# Patient Record
Sex: Male | Born: 1935 | Race: White | Hispanic: No | Marital: Married | Smoking: Former smoker
Health system: Southern US, Community
[De-identification: ages and names within clinical notes are randomized; demographics above are authoritative.]

## PROBLEM LIST (undated history)

## (undated) DIAGNOSIS — E785 Hyperlipidemia, unspecified: Secondary | ICD-10-CM

## (undated) DIAGNOSIS — I10 Essential (primary) hypertension: Secondary | ICD-10-CM

## (undated) DIAGNOSIS — J309 Allergic rhinitis, unspecified: Secondary | ICD-10-CM

## (undated) DIAGNOSIS — M1990 Unspecified osteoarthritis, unspecified site: Secondary | ICD-10-CM

## (undated) DIAGNOSIS — K219 Gastro-esophageal reflux disease without esophagitis: Secondary | ICD-10-CM

## (undated) DIAGNOSIS — J449 Chronic obstructive pulmonary disease, unspecified: Secondary | ICD-10-CM

## (undated) DIAGNOSIS — E1151 Type 2 diabetes mellitus with diabetic peripheral angiopathy without gangrene: Secondary | ICD-10-CM

## (undated) DIAGNOSIS — N289 Disorder of kidney and ureter, unspecified: Secondary | ICD-10-CM

## (undated) DIAGNOSIS — I2510 Atherosclerotic heart disease of native coronary artery without angina pectoris: Secondary | ICD-10-CM

## (undated) HISTORY — DX: Hyperlipidemia, unspecified: E78.5

## (undated) HISTORY — DX: Atherosclerotic heart disease of native coronary artery without angina pectoris: I25.10

## (undated) HISTORY — DX: Disorder of kidney and ureter, unspecified: N28.9

## (undated) HISTORY — DX: Unspecified osteoarthritis, unspecified site: M19.90

## (undated) HISTORY — DX: Type 2 diabetes mellitus with diabetic peripheral angiopathy without gangrene: E11.51

## (undated) HISTORY — DX: Gastro-esophageal reflux disease without esophagitis: K21.9

## (undated) HISTORY — DX: Essential (primary) hypertension: I10

## (undated) HISTORY — DX: Chronic obstructive pulmonary disease, unspecified: J44.9

## (undated) HISTORY — DX: Allergic rhinitis, unspecified: J30.9

---

## 2003-06-22 ENCOUNTER — Inpatient Hospital Stay (HOSPITAL_COMMUNITY): Admission: EM | Admit: 2003-06-22 | Discharge: 2003-06-24 | Payer: Self-pay | Admitting: Internal Medicine

## 2009-09-12 ENCOUNTER — Inpatient Hospital Stay (HOSPITAL_COMMUNITY): Admission: RE | Admit: 2009-09-12 | Discharge: 2009-09-18 | Payer: Self-pay | Admitting: Orthopedic Surgery

## 2009-09-17 ENCOUNTER — Encounter (INDEPENDENT_AMBULATORY_CARE_PROVIDER_SITE_OTHER): Payer: Self-pay | Admitting: Orthopedic Surgery

## 2009-09-17 ENCOUNTER — Ambulatory Visit: Payer: Self-pay | Admitting: Vascular Surgery

## 2011-03-06 LAB — CBC
HCT: 30.6 % — ABNORMAL LOW (ref 39.0–52.0)
HCT: 36 % — ABNORMAL LOW (ref 39.0–52.0)
HCT: 43.6 % (ref 39.0–52.0)
Hemoglobin: 9.9 g/dL — ABNORMAL LOW (ref 13.0–17.0)
Hemoglobin: 10.4 g/dL — ABNORMAL LOW (ref 13.0–17.0)
Hemoglobin: 12.4 g/dL — ABNORMAL LOW (ref 13.0–17.0)
Hemoglobin: 14.9 g/dL (ref 13.0–17.0)
MCHC: 34 g/dL (ref 30.0–36.0)
MCV: 96 fL (ref 78.0–100.0)
Platelets: 111 10*3/uL — ABNORMAL LOW (ref 150–400)
Platelets: 145 10*3/uL — ABNORMAL LOW (ref 150–400)
Platelets: 154 10*3/uL (ref 150–400)
RBC: 4.57 MIL/uL (ref 4.22–5.81)
RDW: 13.3 % (ref 11.5–15.5)
WBC: 4 10*3/uL (ref 4.0–10.5)
WBC: 7.5 10*3/uL (ref 4.0–10.5)

## 2011-03-06 LAB — URINALYSIS, ROUTINE W REFLEX MICROSCOPIC
Bilirubin Urine: NEGATIVE
Hgb urine dipstick: NEGATIVE
Specific Gravity, Urine: 1.017 (ref 1.005–1.030)
Urobilinogen, UA: 0.2 mg/dL (ref 0.0–1.0)
pH: 7 (ref 5.0–8.0)

## 2011-03-06 LAB — BASIC METABOLIC PANEL
BUN: 31 mg/dL — ABNORMAL HIGH (ref 6–23)
BUN: 32 mg/dL — ABNORMAL HIGH (ref 6–23)
BUN: 29 mg/dL — ABNORMAL HIGH (ref 6–23)
BUN: 32 mg/dL — ABNORMAL HIGH (ref 6–23)
CO2: 27 mEq/L (ref 19–32)
CO2: 25 mEq/L (ref 19–32)
Calcium: 8.2 mg/dL — ABNORMAL LOW (ref 8.4–10.5)
Calcium: 8.7 mg/dL (ref 8.4–10.5)
Calcium: 7.7 mg/dL — ABNORMAL LOW (ref 8.4–10.5)
Chloride: 102 mEq/L (ref 96–112)
Chloride: 103 mEq/L (ref 96–112)
Chloride: 104 mEq/L (ref 96–112)
Chloride: 104 mEq/L (ref 96–112)
Creatinine, Ser: 1.46 mg/dL (ref 0.4–1.5)
Creatinine, Ser: 1.74 mg/dL — ABNORMAL HIGH (ref 0.4–1.5)
Creatinine, Ser: 2.56 mg/dL — ABNORMAL HIGH (ref 0.4–1.5)
GFR calc Af Amer: 46 mL/min — ABNORMAL LOW (ref >60)
GFR calc Af Amer: 57 mL/min — ABNORMAL LOW (ref >60)
GFR calc Af Amer: 41 mL/min — ABNORMAL LOW (ref >60)
GFR calc non Af Amer: 38 mL/min — ABNORMAL LOW (ref >60)
GFR calc non Af Amer: 47 mL/min — ABNORMAL LOW (ref >60)
GFR calc non Af Amer: 28 mL/min — ABNORMAL LOW (ref >60)
GFR calc non Af Amer: 34 mL/min — ABNORMAL LOW (ref >60)
Glucose, Bld: 143 mg/dL — ABNORMAL HIGH (ref 70–99)
Potassium: 4.5 mEq/L (ref 3.5–5.1)
Potassium: 4.6 mEq/L (ref 3.5–5.1)
Potassium: 4.5 mEq/L (ref 3.5–5.1)
Potassium: 5.6 mEq/L — ABNORMAL HIGH (ref 3.5–5.1)
Sodium: 135 mEq/L (ref 135–145)
Sodium: 137 mEq/L (ref 135–145)
Sodium: 133 mEq/L — ABNORMAL LOW (ref 135–145)
Sodium: 137 mEq/L (ref 135–145)
Sodium: 137 mEq/L (ref 135–145)

## 2011-03-06 LAB — URINE MICROSCOPIC-ADD ON

## 2011-03-06 LAB — GLUCOSE, CAPILLARY
Glucose-Capillary: 119 mg/dL — ABNORMAL HIGH (ref 70–99)
Glucose-Capillary: 119 mg/dL — ABNORMAL HIGH (ref 70–99)
Glucose-Capillary: 128 mg/dL — ABNORMAL HIGH (ref 70–99)
Glucose-Capillary: 129 mg/dL — ABNORMAL HIGH (ref 70–99)
Glucose-Capillary: 134 mg/dL — ABNORMAL HIGH (ref 70–99)
Glucose-Capillary: 144 mg/dL — ABNORMAL HIGH (ref 70–99)
Glucose-Capillary: 153 mg/dL — ABNORMAL HIGH (ref 70–99)
Glucose-Capillary: 129 mg/dL — ABNORMAL HIGH (ref 70–99)
Glucose-Capillary: 131 mg/dL — ABNORMAL HIGH (ref 70–99)
Glucose-Capillary: 136 mg/dL — ABNORMAL HIGH (ref 70–99)
Glucose-Capillary: 147 mg/dL — ABNORMAL HIGH (ref 70–99)

## 2011-03-06 LAB — COMPREHENSIVE METABOLIC PANEL
Albumin: 4.1 g/dL (ref 3.5–5.2)
Alkaline Phosphatase: 39 U/L (ref 39–117)
BUN: 25 mg/dL — ABNORMAL HIGH (ref 6–23)
CO2: 30 mEq/L (ref 19–32)
Chloride: 105 mEq/L (ref 96–112)
GFR calc non Af Amer: 38 mL/min — ABNORMAL LOW (ref >60)
Potassium: 4.8 mEq/L (ref 3.5–5.1)
Total Bilirubin: 0.8 mg/dL (ref 0.3–1.2)

## 2011-03-06 LAB — PROTIME-INR
INR: 1.59 — ABNORMAL HIGH (ref 0.00–1.49)
INR: 1.83 — ABNORMAL HIGH (ref 0.00–1.49)
INR: 2.16 — ABNORMAL HIGH (ref 0.00–1.49)
INR: 1 (ref 0.00–1.49)
INR: 1.3 (ref 0.00–1.49)
Prothrombin Time: 18.8 seconds — ABNORMAL HIGH (ref 11.6–15.2)
Prothrombin Time: 21 seconds — ABNORMAL HIGH (ref 11.6–15.2)

## 2011-03-06 LAB — CARDIAC PANEL(CRET KIN+CKTOT+MB+TROPI)
CK, MB: 1.4 ng/mL (ref 0.3–4.0)
CK, MB: 1.6 ng/mL (ref 0.3–4.0)
Relative Index: 0.3 (ref 0.0–2.5)
Relative Index: 0.3 (ref 0.0–2.5)
Total CK: 517 U/L — ABNORMAL HIGH (ref 7–232)
Troponin I: 0.04 ng/mL (ref 0.00–0.06)

## 2011-03-06 LAB — TYPE AND SCREEN
ABO/RH(D): A POS
Antibody Screen: NEGATIVE

## 2011-04-18 NOTE — H&P (Signed)
NAME:  Brad [name], ARNWINE NO.:  [id number]   MEDICAL RECORD NO.:  [id number]                   PATIENT TYPE:  INP   LOCATION:  NA                                   FACILITY:  [facility]   PHYSICIAN:  Fay Records, M.D.                 DATE OF BIRTH:  1931-10-04   DATE OF ADMISSION:  DATE OF DISCHARGE:                                HISTORY & PHYSICAL   HISTORY OF PRESENT ILLNESS:  The patient is a 77 year old gentleman who is  transferred from [facility] for evaluation of chest pain.   The patient has a known history of coronary artery disease.  He is status  post myocardial infarction back in 1988.  He underwent CABG at that time by  Dr. Arlyce Dice.  Operative report not available.  He was doing well with no  chest pain.  Stress Cardiolite last Fall was reported normal.  He had a  recent upper respiratory infection with questioned pneumonia, treated with 8  of 10 days of doxycycline.  This is improving with this.  He had left-sided  pleuritic pain.   This morning, he awoke at about 3 a.m. with right-sided pain, non-pleuritic,  non-positional.  He went to rehabilitation.  He walked on the treadmill.  He  pushed some weights.  He went back to the treadmill.  The pain got worse.  It spread more from right to mid chest.  He saw Dr. Berdine Addison there, and he was  taken to the ER.   EKG showed no acute changes.  He was given nitroglycerin, Integrilin and  heparin and transferred.  Note three CK's were negative.  Troponin level was  0.1.  Repeat was 3.1.   Currently the patient is pain-free.   ALLERGIES:  Penicillin.   CURRENT MEDICATIONS:  1. Aspirin 81 mg daily.  2. Toprol XL 50 daily.  3. Protonix 40 daily.  4. Lipitor 10 daily.  5. Tricor 160 daily.  6. Centrum daily.   PAST MEDICAL HISTORY:  1. Coronary artery disease.  2. History of upper respiratory infection/ bronchitis.  3. Hypertension.  4. Gastroesophageal reflux disease.  5.  Hyperlipidemia.   PAST SURGICAL HISTORY:  Status post coronary artery bypass graft.   SOCIAL HISTORY:  The patient quit tobacco in 1988 after a 25-pack year.  Occasionally drinks.  He is married.   FAMILY HISTORY:  Father died of a myocardial infarction at age 58.   PHYSICAL EXAMINATION:  GENERAL:  The patient is in no acute distress.  VITAL SIGNS:  Blood pressure 136/67.  Pulse 59.  Temperature is 98.3.  NECK:  Unable to assess JVP.  LUNGS:  Clear.  CARDIAC:  Regular rate and rhythm, S1 and S2.  No S3 or S4.  No murmurs.  CHEST:  Nontender.  ABDOMEN:  Supple.  EXTREMITIES:  There are 2+ pulses, no edema.   LABORATORY DATA:  Significant for hemoglobin of 14.5, platelets 180,000.  WBC 5.2.  BUN and creatinine 19 and 1.  CK 120, 147, 165.  Troponin 0.1 and  3.1.   Abdominal ultrasound showed fatty liver changes.  Chest CT, no PE, mild  interstitial prominence noted.   A 12-lead EKG here showed normal sinus bradycardia at a rate of 55 beats per  minute.  Incomplete right bundle-branch block.  LVH.  Nonspecific  ST-T wave  changes.   IMPRESSION:  1. The patient is a 77 year old gentleman who is now 16 years post coronary     artery bypass graft with chest pain.  The pain is very atypical.  He did     have a troponin done, but interestingly, it is unclear when the labs were     drawn.  His CK's have remained negative, but his troponin increased to     3.1.  We are having a repeat done here.  (It appears that two sets may     have been drawn near the same time).  Currently, the patient is pain-     free.  He is on Integrilin, heparin, nitroglycerin, and we will continue     this.  2. We will plan cardiac catheterization for tomorrow.                                               Fay Records, M.D.    PVR/MEDQ  D:  06/22/2003  T:  06/23/2003  Job:  [id number]

## 2011-04-18 NOTE — Discharge Summary (Signed)
NAME:  Brad [name], Brad [name]                          ACCOUNT NO.:  [id number]   MEDICAL RECORD NO.:  [id number]                   PATIENT TYPE:  INP   LOCATION:  2005                                 FACILITY:  [facility]   PHYSICIAN:  Dorris Carnes, M.D.                    DATE OF BIRTH:  September 19, 1931   DATE OF ADMISSION:  06/22/2003  DATE OF DISCHARGE:  06/24/2003                                 DISCHARGE SUMMARY   HISTORY OF PRESENT ILLNESS:  This is a 77 year old male with a history of a  MI in 1988. He underwent coronary artery bypass grafting surgery later that  year performed by Dr. Arlyce Dice. He has been doing well until recently when he  developed chest pain. He was admitted to [facility] for further  evaluation.   PAST MEDICAL HISTORY:  As noted, the patient has a history of coronary  artery disease with coronary artery bypass grafting surgery in 1988 as well  as an MI that year. He recently had an upper respiratory tract infection and  bronchitis. He has a history of hypertension, gastroesophageal reflux  disease with a previous negative upper endoscopy. He has a history of  elevated cholesterol levels.   ALLERGIES:  PENICILLIN.   SOCIAL HISTORY:  The patient quit smoking in 1988. He uses alcohol  occasionally. He is married.   HOSPITAL COURSE:  As noted, this patient was admitted to [facility]  on June 22, 2003 to evaluate for chest pain with known coronary artery  disease. The patient underwent cardiac catheterization on June 23, 2003  performed by Dr. Albertine Patricia. The patient was noted to have diffuse small vessel  disease. However, his grafts were widely patent. Continued medical therapy  was recommended with the addition of Plavix. Dr. Albertine Patricia also recommended the  initiation of Altace. However, at the time of discharge the patient reported  that he had previously been tried on Altace and did not tolerate this  medication. We decided to hold the Altace and to let his  primary  cardiologist, Dr. Pati Gallo decide on further medical therapy. It should be  noted that the patient had cardiac enzymes consistent with a non-Q wave MI  this admission. Arrangements were made to discharge the patient on June 24, 2003 in improved and stable condition.   LABORATORY DATA:  CBC revealed hemoglobin 14.3, hematocrit 40.9, WBC 6.7  thousand. Platelets 150,000. BUN was 11, creatinine 1.1, potassium 4.2,  glucose 145, PTT was 30. Cardiac enzymes on admission revealed a CK of 217,  MB of 13.9. Relative index was 6.4. Troponin I was 0.84. On the 23rd, CK was  177, MB was 10.1, relative index 5.7, troponin was 0.51.   DISCHARGE MEDICATIONS:  1. Aspirin 81 mg daily.  2. Toprol XL 50 mg daily.  3. Protonix 40 mg daily.  4. Lipitor 10 mg daily.  5. TriCor  160 mg daily.  6. Centrum daily.  7. Plavix 75 mg daily.  8. Nitroglycerin p.r.n. for chest pain.  9. Tylenol as needed for pain.   ACTIVITY:  The patient was told to avoid any strenuous activity or driving  for 2 days. He is not to lift more than 10 pounds for 1 week.   DIET:  He is to be on a low-salt, low-fat diet.   FOLLOW UP:  He was told to call the office if he had any increased pain,  swelling, or bleeding from his groin. He is to follow up with Dr. Pati Gallo  within the next several weeks.   DISCHARGE PROBLEM LIST:  1. Non-ST elevation myocardial infarction.  2. Cardiac catheterization June 23, 2003 revealing small vessel disease with     patent grafts, continued medical therapy.  3. Normal left ventricular function.  4. History of myocardial infarction and coronary artery bypass grafting in     1988.  5. History of hypertension.  6. History of gastroesophageal reflux disease.  7. Recent upper respiratory tract infection and bronchitis.  8. Remote tobacco history.      Mikey Bussing, P.A. [facility]                  Dorris Carnes, M.D.    DR/MEDQ  D:  06/24/2003  T:  06/24/2003  Job:  [id number]   cc:   Carlisle Beers, M.D.  [place]  [place] [address]  Fax: [phone]

## 2011-04-18 NOTE — Cardiovascular Report (Signed)
NAME:  Brad [name], Brad [name]                          ACCOUNT NO.:  [id number]   MEDICAL RECORD NO.:  [id number]                   PATIENT TYPE:  INP   LOCATION:  2931                                 FACILITY:  [facility]   PHYSICIAN:  Ethelle Lyon, M.D.             DATE OF BIRTH:  18-Mar-1931   DATE OF PROCEDURE:  06/23/2003  DATE OF DISCHARGE:                              CARDIAC CATHETERIZATION   PROCEDURE:  Coronary angiography, left heart catheterization, left  ventriculogram, saphenous vein graft angiography x3, left subclavian  angiography, left internal mammary artery angiography.   INDICATIONS:  The patient is a 77 year old gentleman status post coronary  artery bypass grafting in 1988.  He has done well since then, remaining very  active.  Yesterday he developed chest discomfort occurring at rest.  He was  initially seen at [facility] where troponin was elevated.  He was  transferred here for further evaluation.  Troponin here was 0.84.  He has  been pain-free since admission.  He has been treated with aspirin, heparin,  and eptifibatide.  He is referred for diagnostic angiography.   PROCEDURAL TECHNIQUE:  Informed consent was obtained.  Under 1% lidocaine  local anesthesia a 6-French sheath was placed in the right femoral artery  using the modified Seldinger technique.  Diagnostic angiography was  performed using JL4 and JR4 catheters for the native coronaries, JR4  catheter for each of the saphenous vein grafts, JR4 for the left subclavian.  The catheter was then exchanged for a LIMA catheter which was selectively  engaged in the left internal mammary artery.  Ventriculography was performed  in the RAO projection using power injection and a pigtail catheter.  The  patient tolerated the procedure well and was transferred to the holding room  in stable condition.  Sheaths are to be removed there.   COMPLICATIONS:  None.   FINDINGS:  1. LV 108/9/26.  EF 60% without  regional wall motion abnormality.  2. Left main:  Diffuse mild disease.  3. LAD:  The LAD is a large vessel reaching around the apex of the heart.     It is diffusely diseased proximally.  There is a 90% stenosis after the     takeoff of a small first diagonal branch.  This diagonal has a 95%     proximal stenosis.  The vessel is approximately 1 mm in size.  The LIMA     to the distal LAD is widely patent.  Competitive flow is seen in the     distal vessel.  The saphenous vein graft to the second diagonal branch is     also widely patent.  4. Circumflex:  The circumflex is occluded proximally.  The saphenous vein     graft to two obtuse marginal branches is widely patent with excellent     distal runoff.  5. RCA:  The RCA is a moderate dominant sized vessel.  It is diffusely     diseased throughout its course.  There is a 90% stenosis of the mid     vessel and a subsequent 95% stenosis in the more distal portion of the     mid vessel.  The saphenous vein graft to the distal RCA is widely patent.     There is runoff into a diffusely diseased distal bed.  6. Left subclavian:  No stenosis.   IMPRESSION/RECOMMENDATIONS:  1. Severe native vessel coronary disease particularly of the distal bed of     the right coronary artery.  2. Widely patent bypass grafts.  3. Normal left ventricular size and systolic function.  4. No aortic stenosis or mitral regurgitation.   The patient has diffuse small vessel disease.  I suspect one of these small  vessels is the culprit for his acute coronary syndrome.  Will plan on  continuation of medical therapy with the addition of Plavix and ACE  inhibitor.                                               Ethelle Lyon, M.D.    WED/MEDQ  D:  06/23/2003  T:  06/23/2003  Job:  [id number]  Carlisle Beers, M.D.  [place]  [place] [address]  Fax: [phone]   Marcello Moores, M.D.  [facility]   cc:   Carlisle Beers, M.D.  [address]   [place]  [place] [address]  Fax: [phone]   Marcello Moores, M.D.  [place]

## 2011-07-01 IMAGING — CR DG HIP 1V PORT*R*
1 series · 1 of 1 positions shown · non-contrast
Comparison: 09/05/2009

CLINICAL DATA: OA right hip.  Right T H A.

PORTABLE RIGHT HIP - 1 VIEW

[view not recorded]
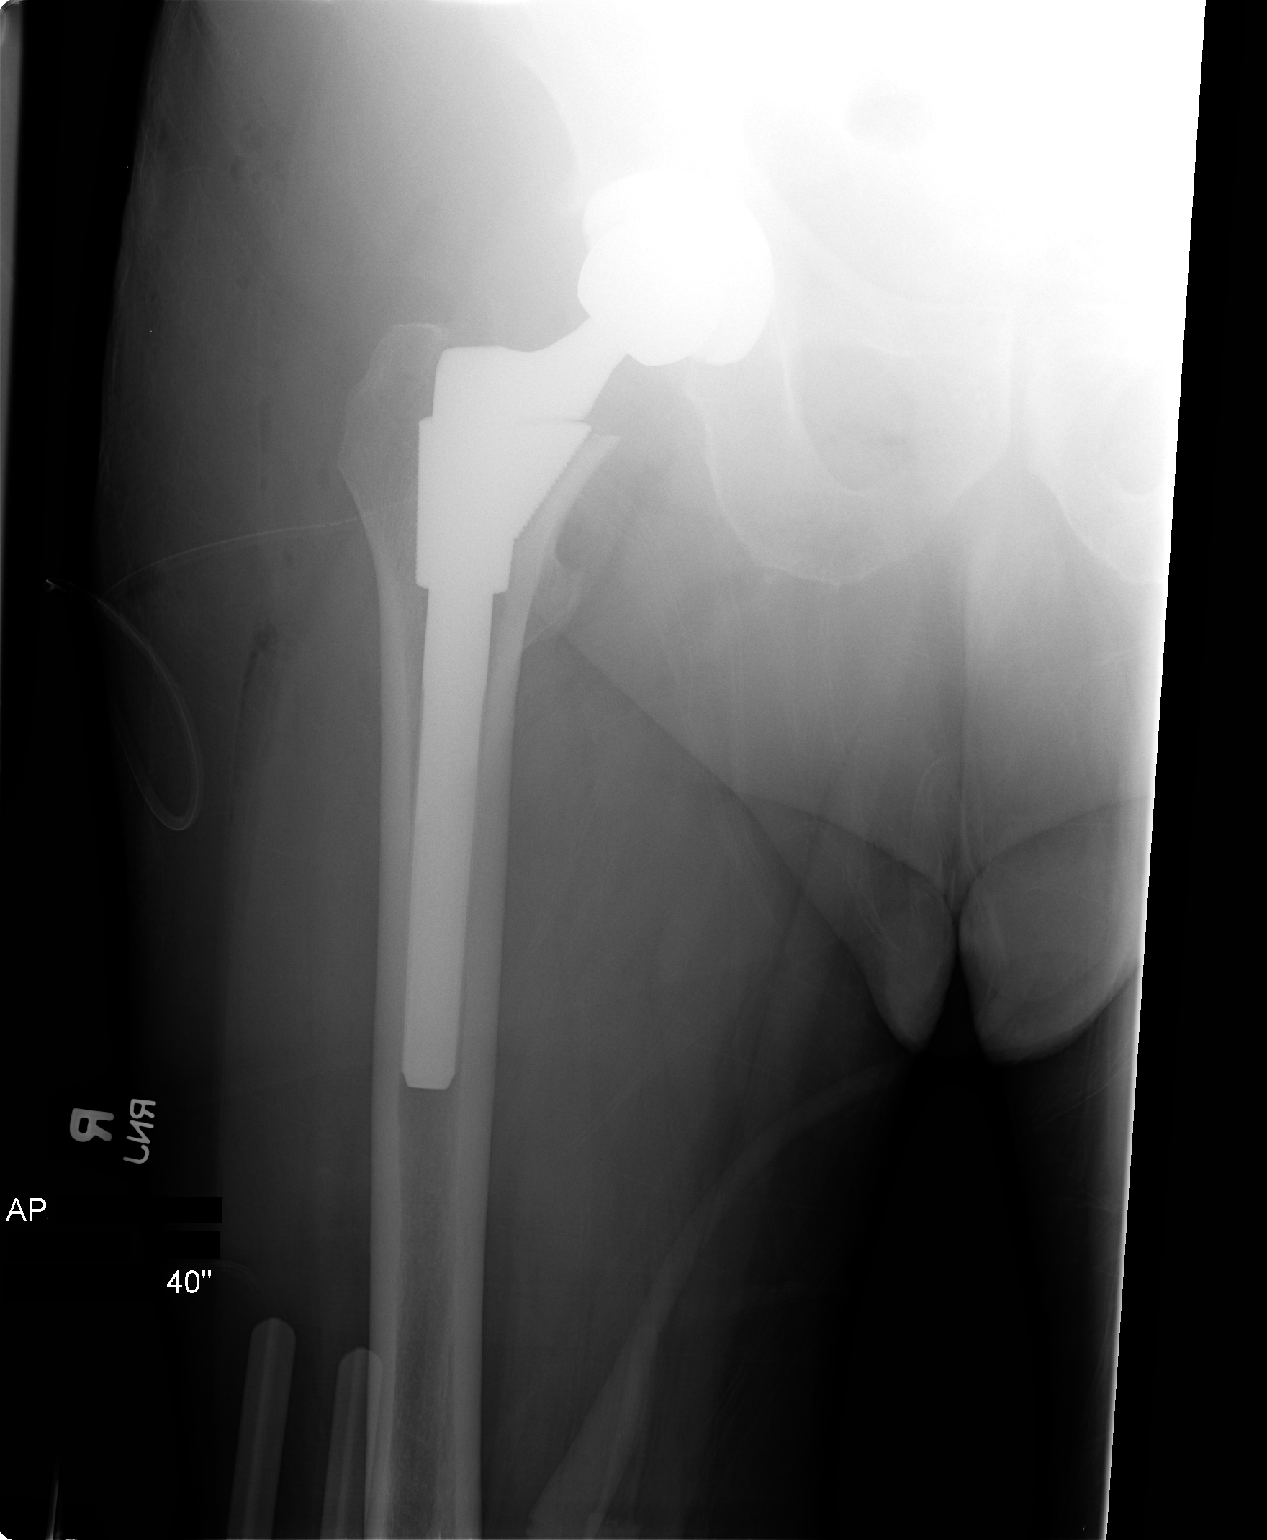

[1 of 1 positions shown; findings below may reference images not displayed]

FINDINGS: The right femoral and acetabular prosthetic components
appear in satisfactory position and alignment.  Radiopaque drain is
noted.
IMPRESSION: Satisfactory appearance of right T H A.

## 2011-07-04 IMAGING — CR DG CHEST 2V
2 series · 2 of 2 positions shown · non-contrast
Comparison: 09/14/2009

CLINICAL DATA: OA right hip.  Fever and cough postop.

CHEST - 2 VIEW

[w chest lat *]
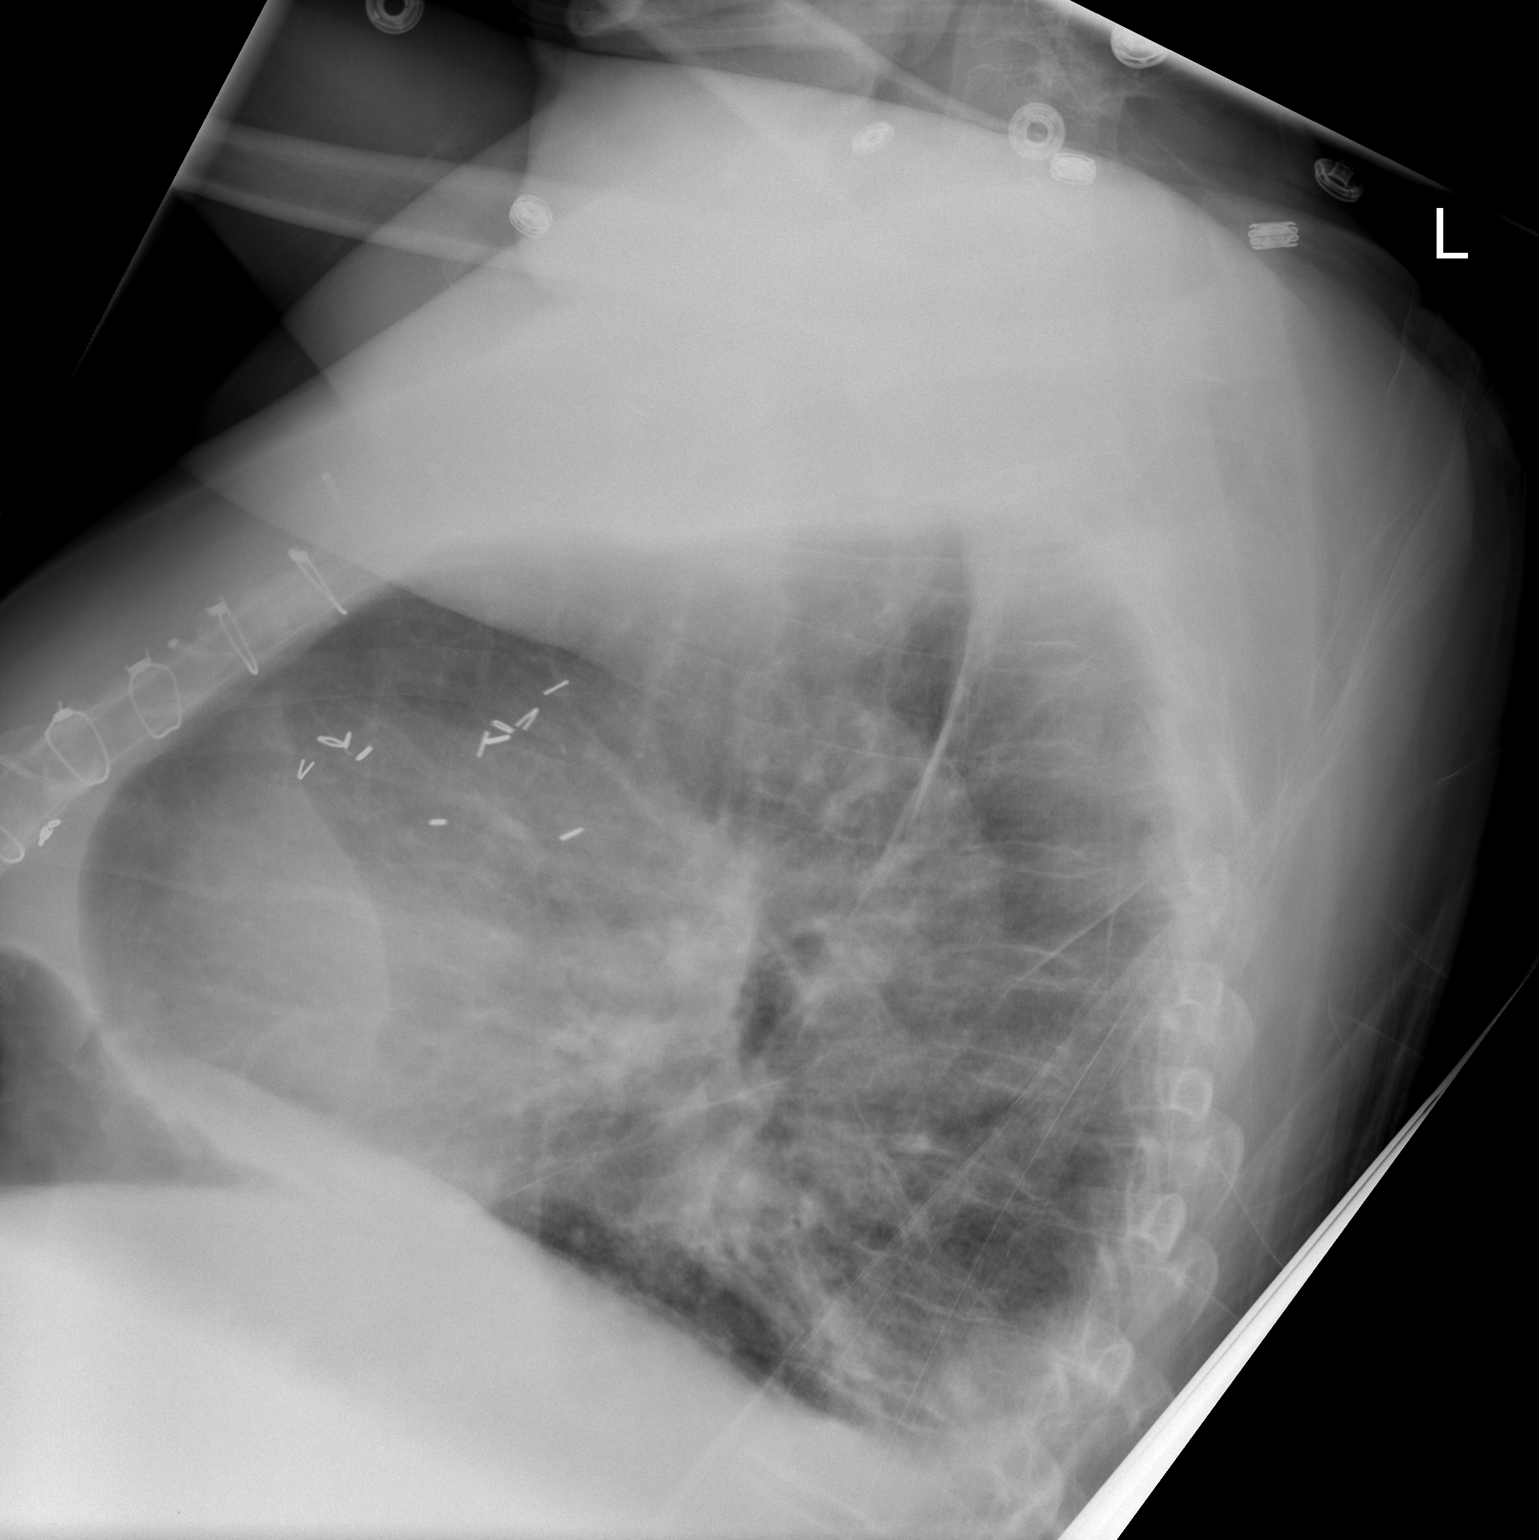

[view not recorded]
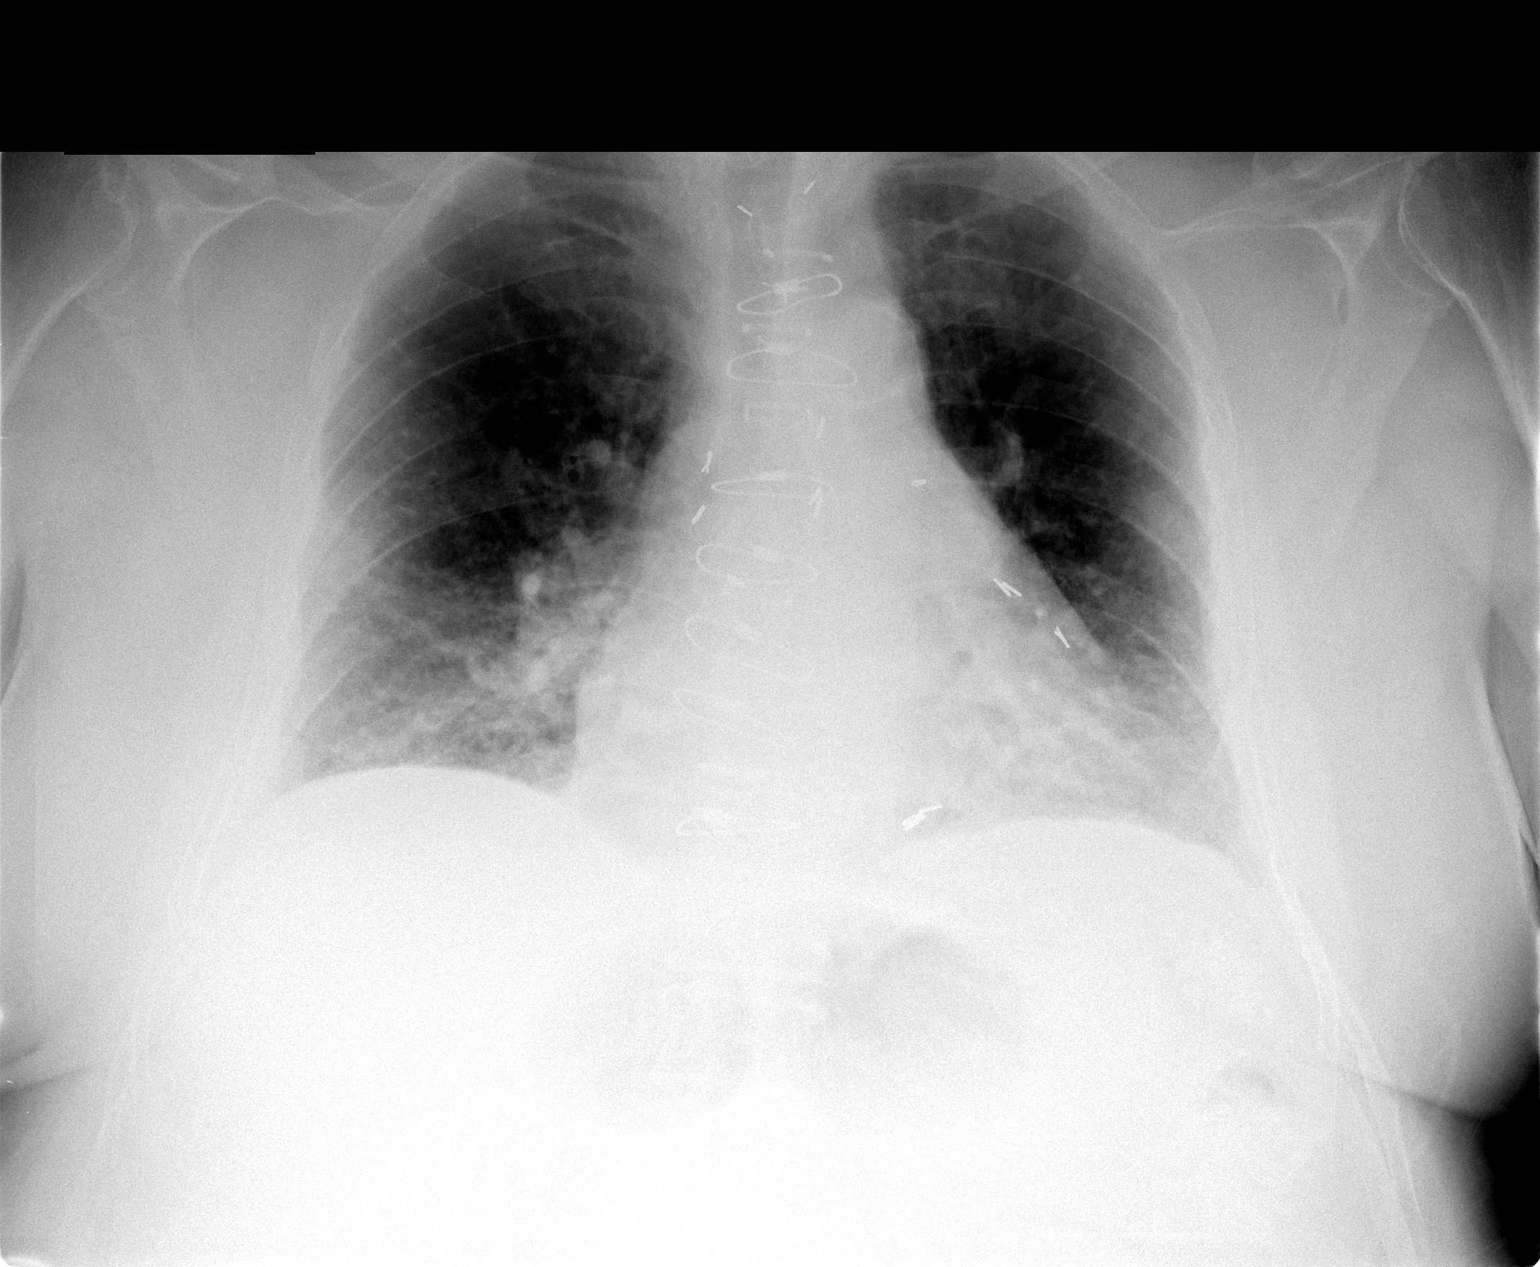

[2 of 2 positions shown; findings below may reference images not displayed]

FINDINGS: Mild cardiomegaly.  Accentuated peribronchial markings
and suggestion of emphysematous changes in the upper lung
zones/apices.  Findings are consistent with COPD. Some improvement
in bibasilar atelectasis. No focal infiltrate.
IMPRESSION: Mild cardiomegaly. Some improvement in atelectasis at the bases.
COPD.

## 2011-12-09 DIAGNOSIS — I119 Hypertensive heart disease without heart failure: Secondary | ICD-10-CM | POA: Diagnosis not present

## 2011-12-09 DIAGNOSIS — E119 Type 2 diabetes mellitus without complications: Secondary | ICD-10-CM | POA: Diagnosis not present

## 2011-12-09 DIAGNOSIS — I2510 Atherosclerotic heart disease of native coronary artery without angina pectoris: Secondary | ICD-10-CM | POA: Diagnosis not present

## 2011-12-09 DIAGNOSIS — N189 Chronic kidney disease, unspecified: Secondary | ICD-10-CM | POA: Diagnosis not present

## 2011-12-12 DIAGNOSIS — E782 Mixed hyperlipidemia: Secondary | ICD-10-CM | POA: Diagnosis not present

## 2011-12-12 DIAGNOSIS — I10 Essential (primary) hypertension: Secondary | ICD-10-CM | POA: Diagnosis not present

## 2011-12-12 DIAGNOSIS — E1159 Type 2 diabetes mellitus with other circulatory complications: Secondary | ICD-10-CM | POA: Diagnosis not present

## 2011-12-12 DIAGNOSIS — J45909 Unspecified asthma, uncomplicated: Secondary | ICD-10-CM | POA: Diagnosis not present

## 2011-12-16 DIAGNOSIS — M79609 Pain in unspecified limb: Secondary | ICD-10-CM | POA: Diagnosis not present

## 2011-12-18 DIAGNOSIS — I2510 Atherosclerotic heart disease of native coronary artery without angina pectoris: Secondary | ICD-10-CM | POA: Diagnosis not present

## 2012-02-18 DIAGNOSIS — J449 Chronic obstructive pulmonary disease, unspecified: Secondary | ICD-10-CM | POA: Diagnosis not present

## 2012-02-18 DIAGNOSIS — IMO0001 Reserved for inherently not codable concepts without codable children: Secondary | ICD-10-CM | POA: Diagnosis not present

## 2012-02-18 DIAGNOSIS — J4489 Other specified chronic obstructive pulmonary disease: Secondary | ICD-10-CM | POA: Diagnosis not present

## 2012-02-20 DIAGNOSIS — J449 Chronic obstructive pulmonary disease, unspecified: Secondary | ICD-10-CM | POA: Diagnosis not present

## 2012-04-14 DIAGNOSIS — E782 Mixed hyperlipidemia: Secondary | ICD-10-CM | POA: Diagnosis not present

## 2012-04-14 DIAGNOSIS — E1159 Type 2 diabetes mellitus with other circulatory complications: Secondary | ICD-10-CM | POA: Diagnosis not present

## 2012-04-14 DIAGNOSIS — I10 Essential (primary) hypertension: Secondary | ICD-10-CM | POA: Diagnosis not present

## 2012-04-14 DIAGNOSIS — J449 Chronic obstructive pulmonary disease, unspecified: Secondary | ICD-10-CM | POA: Diagnosis not present

## 2012-05-05 DIAGNOSIS — J441 Chronic obstructive pulmonary disease with (acute) exacerbation: Secondary | ICD-10-CM | POA: Diagnosis not present

## 2012-05-05 DIAGNOSIS — R05 Cough: Secondary | ICD-10-CM | POA: Diagnosis not present

## 2012-05-05 DIAGNOSIS — Z6836 Body mass index (BMI) 36.0-36.9, adult: Secondary | ICD-10-CM | POA: Diagnosis not present

## 2012-05-05 DIAGNOSIS — R059 Cough, unspecified: Secondary | ICD-10-CM | POA: Diagnosis not present

## 2012-05-05 DIAGNOSIS — J029 Acute pharyngitis, unspecified: Secondary | ICD-10-CM | POA: Diagnosis not present

## 2012-08-16 DIAGNOSIS — E1159 Type 2 diabetes mellitus with other circulatory complications: Secondary | ICD-10-CM | POA: Diagnosis not present

## 2012-08-16 DIAGNOSIS — I10 Essential (primary) hypertension: Secondary | ICD-10-CM | POA: Diagnosis not present

## 2012-08-16 DIAGNOSIS — N289 Disorder of kidney and ureter, unspecified: Secondary | ICD-10-CM | POA: Diagnosis not present

## 2012-08-16 DIAGNOSIS — E782 Mixed hyperlipidemia: Secondary | ICD-10-CM | POA: Diagnosis not present

## 2012-09-27 DIAGNOSIS — N400 Enlarged prostate without lower urinary tract symptoms: Secondary | ICD-10-CM | POA: Diagnosis not present

## 2012-09-27 DIAGNOSIS — J441 Chronic obstructive pulmonary disease with (acute) exacerbation: Secondary | ICD-10-CM | POA: Diagnosis not present

## 2012-09-27 DIAGNOSIS — J029 Acute pharyngitis, unspecified: Secondary | ICD-10-CM | POA: Diagnosis not present

## 2012-10-01 ENCOUNTER — Encounter: Payer: Self-pay | Admitting: Internal Medicine

## 2012-10-03 DIAGNOSIS — I129 Hypertensive chronic kidney disease with stage 1 through stage 4 chronic kidney disease, or unspecified chronic kidney disease: Secondary | ICD-10-CM | POA: Diagnosis not present

## 2012-10-03 DIAGNOSIS — R9431 Abnormal electrocardiogram [ECG] [EKG]: Secondary | ICD-10-CM | POA: Diagnosis not present

## 2012-10-03 DIAGNOSIS — N400 Enlarged prostate without lower urinary tract symptoms: Secondary | ICD-10-CM | POA: Diagnosis not present

## 2012-10-03 DIAGNOSIS — N039 Chronic nephritic syndrome with unspecified morphologic changes: Secondary | ICD-10-CM | POA: Diagnosis not present

## 2012-10-03 DIAGNOSIS — I2510 Atherosclerotic heart disease of native coronary artery without angina pectoris: Secondary | ICD-10-CM | POA: Diagnosis not present

## 2012-10-03 DIAGNOSIS — E119 Type 2 diabetes mellitus without complications: Secondary | ICD-10-CM | POA: Diagnosis not present

## 2012-10-03 DIAGNOSIS — R0789 Other chest pain: Secondary | ICD-10-CM | POA: Diagnosis not present

## 2012-10-03 DIAGNOSIS — Z23 Encounter for immunization: Secondary | ICD-10-CM | POA: Diagnosis not present

## 2012-10-03 DIAGNOSIS — E785 Hyperlipidemia, unspecified: Secondary | ICD-10-CM | POA: Diagnosis not present

## 2012-10-03 DIAGNOSIS — I130 Hypertensive heart and chronic kidney disease with heart failure and stage 1 through stage 4 chronic kidney disease, or unspecified chronic kidney disease: Secondary | ICD-10-CM | POA: Diagnosis not present

## 2012-10-03 DIAGNOSIS — I1310 Hypertensive heart and chronic kidney disease without heart failure, with stage 1 through stage 4 chronic kidney disease, or unspecified chronic kidney disease: Secondary | ICD-10-CM | POA: Diagnosis not present

## 2012-10-03 DIAGNOSIS — J441 Chronic obstructive pulmonary disease with (acute) exacerbation: Secondary | ICD-10-CM | POA: Diagnosis not present

## 2012-10-03 DIAGNOSIS — I200 Unstable angina: Secondary | ICD-10-CM | POA: Diagnosis not present

## 2012-10-03 DIAGNOSIS — M810 Age-related osteoporosis without current pathological fracture: Secondary | ICD-10-CM | POA: Diagnosis not present

## 2012-10-03 DIAGNOSIS — N189 Chronic kidney disease, unspecified: Secondary | ICD-10-CM | POA: Diagnosis not present

## 2012-10-03 DIAGNOSIS — K219 Gastro-esophageal reflux disease without esophagitis: Secondary | ICD-10-CM | POA: Diagnosis not present

## 2012-10-03 DIAGNOSIS — R079 Chest pain, unspecified: Secondary | ICD-10-CM | POA: Diagnosis not present

## 2012-10-04 ENCOUNTER — Institutional Professional Consult (permissible substitution): Payer: Self-pay | Admitting: Internal Medicine

## 2012-10-04 DIAGNOSIS — E119 Type 2 diabetes mellitus without complications: Secondary | ICD-10-CM | POA: Diagnosis not present

## 2012-10-04 DIAGNOSIS — R0789 Other chest pain: Secondary | ICD-10-CM | POA: Diagnosis not present

## 2012-10-04 DIAGNOSIS — R079 Chest pain, unspecified: Secondary | ICD-10-CM | POA: Diagnosis not present

## 2012-10-04 DIAGNOSIS — K219 Gastro-esophageal reflux disease without esophagitis: Secondary | ICD-10-CM | POA: Diagnosis not present

## 2012-10-04 DIAGNOSIS — M810 Age-related osteoporosis without current pathological fracture: Secondary | ICD-10-CM | POA: Diagnosis not present

## 2012-10-04 DIAGNOSIS — N039 Chronic nephritic syndrome with unspecified morphologic changes: Secondary | ICD-10-CM | POA: Diagnosis not present

## 2012-10-04 DIAGNOSIS — I130 Hypertensive heart and chronic kidney disease with heart failure and stage 1 through stage 4 chronic kidney disease, or unspecified chronic kidney disease: Secondary | ICD-10-CM | POA: Diagnosis not present

## 2012-10-04 DIAGNOSIS — J441 Chronic obstructive pulmonary disease with (acute) exacerbation: Secondary | ICD-10-CM | POA: Diagnosis not present

## 2012-10-04 DIAGNOSIS — I2510 Atherosclerotic heart disease of native coronary artery without angina pectoris: Secondary | ICD-10-CM | POA: Diagnosis not present

## 2012-10-04 DIAGNOSIS — I1310 Hypertensive heart and chronic kidney disease without heart failure, with stage 1 through stage 4 chronic kidney disease, or unspecified chronic kidney disease: Secondary | ICD-10-CM | POA: Diagnosis not present

## 2012-10-04 DIAGNOSIS — Z951 Presence of aortocoronary bypass graft: Secondary | ICD-10-CM | POA: Diagnosis not present

## 2012-10-04 DIAGNOSIS — R9431 Abnormal electrocardiogram [ECG] [EKG]: Secondary | ICD-10-CM | POA: Diagnosis not present

## 2012-10-04 DIAGNOSIS — E785 Hyperlipidemia, unspecified: Secondary | ICD-10-CM | POA: Diagnosis not present

## 2012-10-04 DIAGNOSIS — N189 Chronic kidney disease, unspecified: Secondary | ICD-10-CM | POA: Diagnosis not present

## 2012-10-11 DIAGNOSIS — E1159 Type 2 diabetes mellitus with other circulatory complications: Secondary | ICD-10-CM | POA: Diagnosis not present

## 2012-10-11 DIAGNOSIS — Z79899 Other long term (current) drug therapy: Secondary | ICD-10-CM | POA: Diagnosis not present

## 2012-10-11 DIAGNOSIS — J449 Chronic obstructive pulmonary disease, unspecified: Secondary | ICD-10-CM | POA: Diagnosis not present

## 2012-10-11 DIAGNOSIS — E119 Type 2 diabetes mellitus without complications: Secondary | ICD-10-CM | POA: Diagnosis not present

## 2012-10-11 DIAGNOSIS — R0789 Other chest pain: Secondary | ICD-10-CM | POA: Diagnosis not present

## 2012-10-11 DIAGNOSIS — I2510 Atherosclerotic heart disease of native coronary artery without angina pectoris: Secondary | ICD-10-CM | POA: Diagnosis not present

## 2012-10-11 DIAGNOSIS — I119 Hypertensive heart disease without heart failure: Secondary | ICD-10-CM | POA: Diagnosis not present

## 2012-10-11 DIAGNOSIS — N189 Chronic kidney disease, unspecified: Secondary | ICD-10-CM | POA: Diagnosis not present

## 2012-10-11 DIAGNOSIS — E785 Hyperlipidemia, unspecified: Secondary | ICD-10-CM | POA: Diagnosis not present

## 2012-12-13 DIAGNOSIS — I10 Essential (primary) hypertension: Secondary | ICD-10-CM | POA: Diagnosis not present

## 2012-12-13 DIAGNOSIS — E1159 Type 2 diabetes mellitus with other circulatory complications: Secondary | ICD-10-CM | POA: Diagnosis not present

## 2012-12-13 DIAGNOSIS — E782 Mixed hyperlipidemia: Secondary | ICD-10-CM | POA: Diagnosis not present

## 2012-12-13 DIAGNOSIS — I739 Peripheral vascular disease, unspecified: Secondary | ICD-10-CM | POA: Diagnosis not present

## 2013-01-17 DIAGNOSIS — I119 Hypertensive heart disease without heart failure: Secondary | ICD-10-CM | POA: Diagnosis not present

## 2013-01-17 DIAGNOSIS — R0602 Shortness of breath: Secondary | ICD-10-CM | POA: Diagnosis not present

## 2013-01-17 DIAGNOSIS — Z79899 Other long term (current) drug therapy: Secondary | ICD-10-CM | POA: Diagnosis not present

## 2013-01-17 DIAGNOSIS — I2510 Atherosclerotic heart disease of native coronary artery without angina pectoris: Secondary | ICD-10-CM | POA: Diagnosis not present

## 2013-02-03 DIAGNOSIS — J45901 Unspecified asthma with (acute) exacerbation: Secondary | ICD-10-CM | POA: Diagnosis not present

## 2013-02-03 DIAGNOSIS — R0789 Other chest pain: Secondary | ICD-10-CM | POA: Diagnosis not present

## 2013-02-03 DIAGNOSIS — I2510 Atherosclerotic heart disease of native coronary artery without angina pectoris: Secondary | ICD-10-CM | POA: Diagnosis not present

## 2013-02-03 DIAGNOSIS — E119 Type 2 diabetes mellitus without complications: Secondary | ICD-10-CM | POA: Diagnosis not present

## 2013-02-03 DIAGNOSIS — I119 Hypertensive heart disease without heart failure: Secondary | ICD-10-CM | POA: Diagnosis not present

## 2013-02-03 DIAGNOSIS — N189 Chronic kidney disease, unspecified: Secondary | ICD-10-CM | POA: Diagnosis not present

## 2013-02-07 DIAGNOSIS — R0602 Shortness of breath: Secondary | ICD-10-CM | POA: Diagnosis not present

## 2013-02-07 DIAGNOSIS — E119 Type 2 diabetes mellitus without complications: Secondary | ICD-10-CM | POA: Diagnosis not present

## 2013-02-10 DIAGNOSIS — R059 Cough, unspecified: Secondary | ICD-10-CM | POA: Diagnosis not present

## 2013-02-10 DIAGNOSIS — J449 Chronic obstructive pulmonary disease, unspecified: Secondary | ICD-10-CM | POA: Diagnosis not present

## 2013-02-10 DIAGNOSIS — R0609 Other forms of dyspnea: Secondary | ICD-10-CM | POA: Diagnosis not present

## 2013-02-10 DIAGNOSIS — J398 Other specified diseases of upper respiratory tract: Secondary | ICD-10-CM | POA: Diagnosis not present

## 2013-02-10 DIAGNOSIS — R0602 Shortness of breath: Secondary | ICD-10-CM | POA: Diagnosis not present

## 2013-02-24 DIAGNOSIS — I2510 Atherosclerotic heart disease of native coronary artery without angina pectoris: Secondary | ICD-10-CM | POA: Diagnosis not present

## 2013-02-24 DIAGNOSIS — I119 Hypertensive heart disease without heart failure: Secondary | ICD-10-CM | POA: Diagnosis not present

## 2013-02-24 DIAGNOSIS — E119 Type 2 diabetes mellitus without complications: Secondary | ICD-10-CM | POA: Diagnosis not present

## 2013-03-03 DIAGNOSIS — R0989 Other specified symptoms and signs involving the circulatory and respiratory systems: Secondary | ICD-10-CM | POA: Diagnosis not present

## 2013-03-03 DIAGNOSIS — R0609 Other forms of dyspnea: Secondary | ICD-10-CM | POA: Diagnosis not present

## 2013-03-04 DIAGNOSIS — R1310 Dysphagia, unspecified: Secondary | ICD-10-CM | POA: Diagnosis not present

## 2013-03-04 DIAGNOSIS — IMO0001 Reserved for inherently not codable concepts without codable children: Secondary | ICD-10-CM | POA: Diagnosis not present

## 2013-03-08 DIAGNOSIS — I2510 Atherosclerotic heart disease of native coronary artery without angina pectoris: Secondary | ICD-10-CM | POA: Diagnosis not present

## 2013-03-10 DIAGNOSIS — R0609 Other forms of dyspnea: Secondary | ICD-10-CM | POA: Diagnosis not present

## 2013-03-10 DIAGNOSIS — J449 Chronic obstructive pulmonary disease, unspecified: Secondary | ICD-10-CM | POA: Diagnosis not present

## 2013-03-10 DIAGNOSIS — R0902 Hypoxemia: Secondary | ICD-10-CM | POA: Diagnosis not present

## 2013-03-15 DIAGNOSIS — J449 Chronic obstructive pulmonary disease, unspecified: Secondary | ICD-10-CM | POA: Diagnosis not present

## 2013-03-15 DIAGNOSIS — Z5189 Encounter for other specified aftercare: Secondary | ICD-10-CM | POA: Diagnosis not present

## 2013-03-16 DIAGNOSIS — Z5189 Encounter for other specified aftercare: Secondary | ICD-10-CM | POA: Diagnosis not present

## 2013-03-16 DIAGNOSIS — J449 Chronic obstructive pulmonary disease, unspecified: Secondary | ICD-10-CM | POA: Diagnosis not present

## 2013-03-17 DIAGNOSIS — Z5189 Encounter for other specified aftercare: Secondary | ICD-10-CM | POA: Diagnosis not present

## 2013-03-17 DIAGNOSIS — J449 Chronic obstructive pulmonary disease, unspecified: Secondary | ICD-10-CM | POA: Diagnosis not present

## 2013-03-21 DIAGNOSIS — Z5189 Encounter for other specified aftercare: Secondary | ICD-10-CM | POA: Diagnosis not present

## 2013-03-21 DIAGNOSIS — J449 Chronic obstructive pulmonary disease, unspecified: Secondary | ICD-10-CM | POA: Diagnosis not present

## 2013-03-23 DIAGNOSIS — Z5189 Encounter for other specified aftercare: Secondary | ICD-10-CM | POA: Diagnosis not present

## 2013-03-23 DIAGNOSIS — J449 Chronic obstructive pulmonary disease, unspecified: Secondary | ICD-10-CM | POA: Diagnosis not present

## 2013-03-25 DIAGNOSIS — Z5189 Encounter for other specified aftercare: Secondary | ICD-10-CM | POA: Diagnosis not present

## 2013-03-25 DIAGNOSIS — J449 Chronic obstructive pulmonary disease, unspecified: Secondary | ICD-10-CM | POA: Diagnosis not present

## 2013-03-28 DIAGNOSIS — Z5189 Encounter for other specified aftercare: Secondary | ICD-10-CM | POA: Diagnosis not present

## 2013-03-28 DIAGNOSIS — J449 Chronic obstructive pulmonary disease, unspecified: Secondary | ICD-10-CM | POA: Diagnosis not present

## 2013-03-29 DIAGNOSIS — R0789 Other chest pain: Secondary | ICD-10-CM | POA: Diagnosis not present

## 2013-03-29 DIAGNOSIS — R0902 Hypoxemia: Secondary | ICD-10-CM | POA: Diagnosis not present

## 2013-03-29 DIAGNOSIS — E785 Hyperlipidemia, unspecified: Secondary | ICD-10-CM | POA: Diagnosis not present

## 2013-03-29 DIAGNOSIS — I2510 Atherosclerotic heart disease of native coronary artery without angina pectoris: Secondary | ICD-10-CM | POA: Diagnosis not present

## 2013-03-29 DIAGNOSIS — J449 Chronic obstructive pulmonary disease, unspecified: Secondary | ICD-10-CM | POA: Diagnosis not present

## 2013-03-29 DIAGNOSIS — E119 Type 2 diabetes mellitus without complications: Secondary | ICD-10-CM | POA: Diagnosis not present

## 2013-03-29 DIAGNOSIS — N189 Chronic kidney disease, unspecified: Secondary | ICD-10-CM | POA: Diagnosis not present

## 2013-03-30 DIAGNOSIS — Z5189 Encounter for other specified aftercare: Secondary | ICD-10-CM | POA: Diagnosis not present

## 2013-03-30 DIAGNOSIS — J449 Chronic obstructive pulmonary disease, unspecified: Secondary | ICD-10-CM | POA: Diagnosis not present

## 2013-04-01 DIAGNOSIS — J449 Chronic obstructive pulmonary disease, unspecified: Secondary | ICD-10-CM | POA: Diagnosis not present

## 2013-04-01 DIAGNOSIS — Z5189 Encounter for other specified aftercare: Secondary | ICD-10-CM | POA: Diagnosis not present

## 2013-04-12 DIAGNOSIS — Z23 Encounter for immunization: Secondary | ICD-10-CM | POA: Diagnosis not present

## 2013-04-12 DIAGNOSIS — Z6838 Body mass index (BMI) 38.0-38.9, adult: Secondary | ICD-10-CM | POA: Diagnosis not present

## 2013-04-12 DIAGNOSIS — J449 Chronic obstructive pulmonary disease, unspecified: Secondary | ICD-10-CM | POA: Diagnosis not present

## 2013-04-12 DIAGNOSIS — I10 Essential (primary) hypertension: Secondary | ICD-10-CM | POA: Diagnosis not present

## 2013-04-12 DIAGNOSIS — S81809A Unspecified open wound, unspecified lower leg, initial encounter: Secondary | ICD-10-CM | POA: Diagnosis not present

## 2013-05-02 DIAGNOSIS — Z5189 Encounter for other specified aftercare: Secondary | ICD-10-CM | POA: Diagnosis not present

## 2013-05-02 DIAGNOSIS — J449 Chronic obstructive pulmonary disease, unspecified: Secondary | ICD-10-CM | POA: Diagnosis not present

## 2013-05-04 DIAGNOSIS — Z5189 Encounter for other specified aftercare: Secondary | ICD-10-CM | POA: Diagnosis not present

## 2013-05-04 DIAGNOSIS — J449 Chronic obstructive pulmonary disease, unspecified: Secondary | ICD-10-CM | POA: Diagnosis not present

## 2013-05-06 DIAGNOSIS — Z5189 Encounter for other specified aftercare: Secondary | ICD-10-CM | POA: Diagnosis not present

## 2013-05-06 DIAGNOSIS — J449 Chronic obstructive pulmonary disease, unspecified: Secondary | ICD-10-CM | POA: Diagnosis not present

## 2013-05-09 DIAGNOSIS — Z5189 Encounter for other specified aftercare: Secondary | ICD-10-CM | POA: Diagnosis not present

## 2013-05-09 DIAGNOSIS — J449 Chronic obstructive pulmonary disease, unspecified: Secondary | ICD-10-CM | POA: Diagnosis not present

## 2013-05-11 DIAGNOSIS — Z5189 Encounter for other specified aftercare: Secondary | ICD-10-CM | POA: Diagnosis not present

## 2013-05-11 DIAGNOSIS — J449 Chronic obstructive pulmonary disease, unspecified: Secondary | ICD-10-CM | POA: Diagnosis not present

## 2013-05-13 DIAGNOSIS — J449 Chronic obstructive pulmonary disease, unspecified: Secondary | ICD-10-CM | POA: Diagnosis not present

## 2013-05-13 DIAGNOSIS — Z5189 Encounter for other specified aftercare: Secondary | ICD-10-CM | POA: Diagnosis not present

## 2013-05-23 DIAGNOSIS — Z5189 Encounter for other specified aftercare: Secondary | ICD-10-CM | POA: Diagnosis not present

## 2013-05-23 DIAGNOSIS — J449 Chronic obstructive pulmonary disease, unspecified: Secondary | ICD-10-CM | POA: Diagnosis not present

## 2013-05-25 DIAGNOSIS — Z5189 Encounter for other specified aftercare: Secondary | ICD-10-CM | POA: Diagnosis not present

## 2013-05-25 DIAGNOSIS — J449 Chronic obstructive pulmonary disease, unspecified: Secondary | ICD-10-CM | POA: Diagnosis not present

## 2013-05-27 DIAGNOSIS — J449 Chronic obstructive pulmonary disease, unspecified: Secondary | ICD-10-CM | POA: Diagnosis not present

## 2013-05-27 DIAGNOSIS — Z5189 Encounter for other specified aftercare: Secondary | ICD-10-CM | POA: Diagnosis not present

## 2013-06-01 DIAGNOSIS — Z5189 Encounter for other specified aftercare: Secondary | ICD-10-CM | POA: Diagnosis not present

## 2013-06-01 DIAGNOSIS — J449 Chronic obstructive pulmonary disease, unspecified: Secondary | ICD-10-CM | POA: Diagnosis not present

## 2013-06-02 DIAGNOSIS — J449 Chronic obstructive pulmonary disease, unspecified: Secondary | ICD-10-CM | POA: Diagnosis not present

## 2013-06-02 DIAGNOSIS — Z5189 Encounter for other specified aftercare: Secondary | ICD-10-CM | POA: Diagnosis not present

## 2013-06-06 DIAGNOSIS — J449 Chronic obstructive pulmonary disease, unspecified: Secondary | ICD-10-CM | POA: Diagnosis not present

## 2013-06-06 DIAGNOSIS — Z5189 Encounter for other specified aftercare: Secondary | ICD-10-CM | POA: Diagnosis not present

## 2013-06-08 DIAGNOSIS — Z5189 Encounter for other specified aftercare: Secondary | ICD-10-CM | POA: Diagnosis not present

## 2013-06-08 DIAGNOSIS — J449 Chronic obstructive pulmonary disease, unspecified: Secondary | ICD-10-CM | POA: Diagnosis not present

## 2013-06-13 DIAGNOSIS — Z5189 Encounter for other specified aftercare: Secondary | ICD-10-CM | POA: Diagnosis not present

## 2013-06-13 DIAGNOSIS — J449 Chronic obstructive pulmonary disease, unspecified: Secondary | ICD-10-CM | POA: Diagnosis not present

## 2013-06-17 DIAGNOSIS — J449 Chronic obstructive pulmonary disease, unspecified: Secondary | ICD-10-CM | POA: Diagnosis not present

## 2013-06-17 DIAGNOSIS — Z5189 Encounter for other specified aftercare: Secondary | ICD-10-CM | POA: Diagnosis not present

## 2013-06-20 DIAGNOSIS — J449 Chronic obstructive pulmonary disease, unspecified: Secondary | ICD-10-CM | POA: Diagnosis not present

## 2013-06-20 DIAGNOSIS — Z5189 Encounter for other specified aftercare: Secondary | ICD-10-CM | POA: Diagnosis not present

## 2013-07-27 DIAGNOSIS — J449 Chronic obstructive pulmonary disease, unspecified: Secondary | ICD-10-CM | POA: Diagnosis not present

## 2013-08-16 DIAGNOSIS — I2510 Atherosclerotic heart disease of native coronary artery without angina pectoris: Secondary | ICD-10-CM | POA: Diagnosis not present

## 2013-08-16 DIAGNOSIS — E1159 Type 2 diabetes mellitus with other circulatory complications: Secondary | ICD-10-CM | POA: Diagnosis not present

## 2013-08-16 DIAGNOSIS — E782 Mixed hyperlipidemia: Secondary | ICD-10-CM | POA: Diagnosis not present

## 2013-08-16 DIAGNOSIS — I10 Essential (primary) hypertension: Secondary | ICD-10-CM | POA: Diagnosis not present

## 2013-08-30 DIAGNOSIS — H2589 Other age-related cataract: Secondary | ICD-10-CM | POA: Diagnosis not present

## 2013-08-30 DIAGNOSIS — H40019 Open angle with borderline findings, low risk, unspecified eye: Secondary | ICD-10-CM | POA: Diagnosis not present

## 2013-09-07 DIAGNOSIS — H2510 Age-related nuclear cataract, unspecified eye: Secondary | ICD-10-CM | POA: Diagnosis not present

## 2013-09-07 DIAGNOSIS — H2589 Other age-related cataract: Secondary | ICD-10-CM | POA: Diagnosis not present

## 2013-09-07 DIAGNOSIS — H52209 Unspecified astigmatism, unspecified eye: Secondary | ICD-10-CM | POA: Diagnosis not present

## 2013-09-10 DIAGNOSIS — Z23 Encounter for immunization: Secondary | ICD-10-CM | POA: Diagnosis not present

## 2013-11-15 DIAGNOSIS — M159 Polyosteoarthritis, unspecified: Secondary | ICD-10-CM | POA: Diagnosis not present

## 2013-11-15 DIAGNOSIS — E1159 Type 2 diabetes mellitus with other circulatory complications: Secondary | ICD-10-CM | POA: Diagnosis not present

## 2013-11-15 DIAGNOSIS — Z9181 History of falling: Secondary | ICD-10-CM | POA: Diagnosis not present

## 2013-11-15 DIAGNOSIS — I739 Peripheral vascular disease, unspecified: Secondary | ICD-10-CM | POA: Diagnosis not present

## 2013-11-15 DIAGNOSIS — I10 Essential (primary) hypertension: Secondary | ICD-10-CM | POA: Diagnosis not present

## 2013-11-15 DIAGNOSIS — E782 Mixed hyperlipidemia: Secondary | ICD-10-CM | POA: Diagnosis not present

## 2013-11-15 DIAGNOSIS — E1129 Type 2 diabetes mellitus with other diabetic kidney complication: Secondary | ICD-10-CM | POA: Diagnosis not present

## 2013-11-15 DIAGNOSIS — Z1331 Encounter for screening for depression: Secondary | ICD-10-CM | POA: Diagnosis not present

## 2013-11-15 DIAGNOSIS — Z125 Encounter for screening for malignant neoplasm of prostate: Secondary | ICD-10-CM | POA: Diagnosis not present

## 2013-12-09 DIAGNOSIS — J441 Chronic obstructive pulmonary disease with (acute) exacerbation: Secondary | ICD-10-CM | POA: Diagnosis not present

## 2013-12-09 DIAGNOSIS — Z79899 Other long term (current) drug therapy: Secondary | ICD-10-CM | POA: Diagnosis not present

## 2014-02-07 DIAGNOSIS — R002 Palpitations: Secondary | ICD-10-CM | POA: Diagnosis not present

## 2014-02-07 DIAGNOSIS — J61 Pneumoconiosis due to asbestos and other mineral fibers: Secondary | ICD-10-CM | POA: Diagnosis present

## 2014-02-07 DIAGNOSIS — J4489 Other specified chronic obstructive pulmonary disease: Secondary | ICD-10-CM | POA: Diagnosis not present

## 2014-02-07 DIAGNOSIS — I359 Nonrheumatic aortic valve disorder, unspecified: Secondary | ICD-10-CM | POA: Diagnosis not present

## 2014-02-07 DIAGNOSIS — E785 Hyperlipidemia, unspecified: Secondary | ICD-10-CM | POA: Diagnosis present

## 2014-02-07 DIAGNOSIS — Z6836 Body mass index (BMI) 36.0-36.9, adult: Secondary | ICD-10-CM | POA: Diagnosis not present

## 2014-02-07 DIAGNOSIS — R0609 Other forms of dyspnea: Secondary | ICD-10-CM | POA: Diagnosis not present

## 2014-02-07 DIAGNOSIS — I5033 Acute on chronic diastolic (congestive) heart failure: Secondary | ICD-10-CM | POA: Diagnosis present

## 2014-02-07 DIAGNOSIS — I509 Heart failure, unspecified: Secondary | ICD-10-CM | POA: Diagnosis present

## 2014-02-07 DIAGNOSIS — N19 Unspecified kidney failure: Secondary | ICD-10-CM | POA: Diagnosis not present

## 2014-02-07 DIAGNOSIS — I4892 Unspecified atrial flutter: Secondary | ICD-10-CM | POA: Diagnosis not present

## 2014-02-07 DIAGNOSIS — I129 Hypertensive chronic kidney disease with stage 1 through stage 4 chronic kidney disease, or unspecified chronic kidney disease: Secondary | ICD-10-CM | POA: Diagnosis present

## 2014-02-07 DIAGNOSIS — R079 Chest pain, unspecified: Secondary | ICD-10-CM | POA: Diagnosis not present

## 2014-02-07 DIAGNOSIS — E669 Obesity, unspecified: Secondary | ICD-10-CM | POA: Diagnosis not present

## 2014-02-07 DIAGNOSIS — E119 Type 2 diabetes mellitus without complications: Secondary | ICD-10-CM | POA: Diagnosis not present

## 2014-02-07 DIAGNOSIS — Z7982 Long term (current) use of aspirin: Secondary | ICD-10-CM | POA: Diagnosis not present

## 2014-02-07 DIAGNOSIS — I501 Left ventricular failure: Secondary | ICD-10-CM | POA: Diagnosis not present

## 2014-02-07 DIAGNOSIS — J449 Chronic obstructive pulmonary disease, unspecified: Secondary | ICD-10-CM | POA: Diagnosis not present

## 2014-02-07 DIAGNOSIS — R0789 Other chest pain: Secondary | ICD-10-CM | POA: Diagnosis not present

## 2014-02-07 DIAGNOSIS — Z87891 Personal history of nicotine dependence: Secondary | ICD-10-CM | POA: Diagnosis not present

## 2014-02-07 DIAGNOSIS — Z79899 Other long term (current) drug therapy: Secondary | ICD-10-CM | POA: Diagnosis not present

## 2014-02-07 DIAGNOSIS — N189 Chronic kidney disease, unspecified: Secondary | ICD-10-CM | POA: Diagnosis present

## 2014-02-07 DIAGNOSIS — R000 Tachycardia, unspecified: Secondary | ICD-10-CM | POA: Diagnosis not present

## 2014-02-07 DIAGNOSIS — M1990 Unspecified osteoarthritis, unspecified site: Secondary | ICD-10-CM | POA: Diagnosis present

## 2014-02-07 DIAGNOSIS — I2510 Atherosclerotic heart disease of native coronary artery without angina pectoris: Secondary | ICD-10-CM | POA: Diagnosis not present

## 2014-02-07 DIAGNOSIS — Z951 Presence of aortocoronary bypass graft: Secondary | ICD-10-CM | POA: Diagnosis not present

## 2014-02-07 DIAGNOSIS — I517 Cardiomegaly: Secondary | ICD-10-CM | POA: Diagnosis not present

## 2014-02-07 DIAGNOSIS — Z7902 Long term (current) use of antithrombotics/antiplatelets: Secondary | ICD-10-CM | POA: Diagnosis not present

## 2014-02-07 DIAGNOSIS — I369 Nonrheumatic tricuspid valve disorder, unspecified: Secondary | ICD-10-CM | POA: Diagnosis not present

## 2014-02-17 DIAGNOSIS — I4892 Unspecified atrial flutter: Secondary | ICD-10-CM | POA: Diagnosis not present

## 2014-02-17 DIAGNOSIS — N189 Chronic kidney disease, unspecified: Secondary | ICD-10-CM | POA: Diagnosis not present

## 2014-02-17 DIAGNOSIS — I4891 Unspecified atrial fibrillation: Secondary | ICD-10-CM | POA: Diagnosis not present

## 2014-02-17 DIAGNOSIS — E119 Type 2 diabetes mellitus without complications: Secondary | ICD-10-CM | POA: Diagnosis not present

## 2014-02-17 DIAGNOSIS — I2510 Atherosclerotic heart disease of native coronary artery without angina pectoris: Secondary | ICD-10-CM | POA: Diagnosis not present

## 2014-02-20 DIAGNOSIS — N189 Chronic kidney disease, unspecified: Secondary | ICD-10-CM | POA: Diagnosis not present

## 2014-02-20 DIAGNOSIS — H40019 Open angle with borderline findings, low risk, unspecified eye: Secondary | ICD-10-CM | POA: Diagnosis not present

## 2014-02-20 DIAGNOSIS — E785 Hyperlipidemia, unspecified: Secondary | ICD-10-CM | POA: Diagnosis not present

## 2014-02-20 DIAGNOSIS — I4892 Unspecified atrial flutter: Secondary | ICD-10-CM | POA: Diagnosis not present

## 2014-02-20 DIAGNOSIS — I2510 Atherosclerotic heart disease of native coronary artery without angina pectoris: Secondary | ICD-10-CM | POA: Diagnosis not present

## 2014-02-20 DIAGNOSIS — I4891 Unspecified atrial fibrillation: Secondary | ICD-10-CM | POA: Diagnosis not present

## 2014-02-20 DIAGNOSIS — E119 Type 2 diabetes mellitus without complications: Secondary | ICD-10-CM | POA: Diagnosis not present

## 2014-02-21 DIAGNOSIS — I10 Essential (primary) hypertension: Secondary | ICD-10-CM | POA: Diagnosis not present

## 2014-02-21 DIAGNOSIS — Z6837 Body mass index (BMI) 37.0-37.9, adult: Secondary | ICD-10-CM | POA: Diagnosis not present

## 2014-02-21 DIAGNOSIS — E782 Mixed hyperlipidemia: Secondary | ICD-10-CM | POA: Diagnosis not present

## 2014-02-21 DIAGNOSIS — E1159 Type 2 diabetes mellitus with other circulatory complications: Secondary | ICD-10-CM | POA: Diagnosis not present

## 2014-03-02 DIAGNOSIS — I2510 Atherosclerotic heart disease of native coronary artery without angina pectoris: Secondary | ICD-10-CM | POA: Diagnosis not present

## 2014-03-02 DIAGNOSIS — I4891 Unspecified atrial fibrillation: Secondary | ICD-10-CM | POA: Diagnosis not present

## 2014-03-23 DIAGNOSIS — R0789 Other chest pain: Secondary | ICD-10-CM | POA: Diagnosis not present

## 2014-03-28 DIAGNOSIS — R079 Chest pain, unspecified: Secondary | ICD-10-CM | POA: Diagnosis not present

## 2014-04-20 DIAGNOSIS — R609 Edema, unspecified: Secondary | ICD-10-CM | POA: Diagnosis not present

## 2014-04-20 DIAGNOSIS — R0602 Shortness of breath: Secondary | ICD-10-CM | POA: Diagnosis not present

## 2014-05-04 DIAGNOSIS — M76899 Other specified enthesopathies of unspecified lower limb, excluding foot: Secondary | ICD-10-CM | POA: Diagnosis not present

## 2014-05-04 DIAGNOSIS — Z9660 Presence of unspecified orthopedic joint implant: Secondary | ICD-10-CM | POA: Diagnosis not present

## 2014-05-30 DIAGNOSIS — I2510 Atherosclerotic heart disease of native coronary artery without angina pectoris: Secondary | ICD-10-CM | POA: Diagnosis not present

## 2014-05-30 DIAGNOSIS — I119 Hypertensive heart disease without heart failure: Secondary | ICD-10-CM | POA: Diagnosis not present

## 2014-05-30 DIAGNOSIS — E785 Hyperlipidemia, unspecified: Secondary | ICD-10-CM | POA: Diagnosis not present

## 2014-05-30 DIAGNOSIS — E119 Type 2 diabetes mellitus without complications: Secondary | ICD-10-CM | POA: Diagnosis not present

## 2014-05-30 DIAGNOSIS — I4891 Unspecified atrial fibrillation: Secondary | ICD-10-CM | POA: Diagnosis not present

## 2014-05-30 DIAGNOSIS — J449 Chronic obstructive pulmonary disease, unspecified: Secondary | ICD-10-CM | POA: Diagnosis not present

## 2014-06-08 DIAGNOSIS — M76899 Other specified enthesopathies of unspecified lower limb, excluding foot: Secondary | ICD-10-CM | POA: Diagnosis not present

## 2014-06-15 DIAGNOSIS — M76899 Other specified enthesopathies of unspecified lower limb, excluding foot: Secondary | ICD-10-CM | POA: Diagnosis not present

## 2014-06-29 DIAGNOSIS — Z9660 Presence of unspecified orthopedic joint implant: Secondary | ICD-10-CM | POA: Diagnosis not present

## 2014-07-03 DIAGNOSIS — I10 Essential (primary) hypertension: Secondary | ICD-10-CM | POA: Diagnosis not present

## 2014-07-03 DIAGNOSIS — E782 Mixed hyperlipidemia: Secondary | ICD-10-CM | POA: Diagnosis not present

## 2014-07-03 DIAGNOSIS — E1159 Type 2 diabetes mellitus with other circulatory complications: Secondary | ICD-10-CM | POA: Diagnosis not present

## 2014-07-03 DIAGNOSIS — I2510 Atherosclerotic heart disease of native coronary artery without angina pectoris: Secondary | ICD-10-CM | POA: Diagnosis not present

## 2014-07-04 DIAGNOSIS — M79609 Pain in unspecified limb: Secondary | ICD-10-CM | POA: Diagnosis not present

## 2014-07-12 DIAGNOSIS — J449 Chronic obstructive pulmonary disease, unspecified: Secondary | ICD-10-CM | POA: Diagnosis not present

## 2014-07-19 DIAGNOSIS — E669 Obesity, unspecified: Secondary | ICD-10-CM | POA: Diagnosis not present

## 2014-07-19 DIAGNOSIS — J449 Chronic obstructive pulmonary disease, unspecified: Secondary | ICD-10-CM | POA: Diagnosis not present

## 2014-08-18 DIAGNOSIS — Z23 Encounter for immunization: Secondary | ICD-10-CM | POA: Diagnosis not present

## 2014-08-28 DIAGNOSIS — H40019 Open angle with borderline findings, low risk, unspecified eye: Secondary | ICD-10-CM | POA: Diagnosis not present

## 2014-08-28 DIAGNOSIS — H2510 Age-related nuclear cataract, unspecified eye: Secondary | ICD-10-CM | POA: Diagnosis not present

## 2014-09-01 DIAGNOSIS — E119 Type 2 diabetes mellitus without complications: Secondary | ICD-10-CM | POA: Diagnosis not present

## 2014-09-01 DIAGNOSIS — E785 Hyperlipidemia, unspecified: Secondary | ICD-10-CM | POA: Diagnosis not present

## 2014-09-01 DIAGNOSIS — I119 Hypertensive heart disease without heart failure: Secondary | ICD-10-CM | POA: Diagnosis not present

## 2014-09-01 DIAGNOSIS — I4891 Unspecified atrial fibrillation: Secondary | ICD-10-CM | POA: Diagnosis not present

## 2014-09-01 DIAGNOSIS — H40013 Open angle with borderline findings, low risk, bilateral: Secondary | ICD-10-CM | POA: Diagnosis not present

## 2014-09-01 DIAGNOSIS — N189 Chronic kidney disease, unspecified: Secondary | ICD-10-CM | POA: Diagnosis not present

## 2014-11-02 DIAGNOSIS — I2510 Atherosclerotic heart disease of native coronary artery without angina pectoris: Secondary | ICD-10-CM | POA: Diagnosis not present

## 2014-11-02 DIAGNOSIS — K219 Gastro-esophageal reflux disease without esophagitis: Secondary | ICD-10-CM | POA: Diagnosis not present

## 2014-11-02 DIAGNOSIS — E782 Mixed hyperlipidemia: Secondary | ICD-10-CM | POA: Diagnosis not present

## 2014-11-02 DIAGNOSIS — Z9181 History of falling: Secondary | ICD-10-CM | POA: Diagnosis not present

## 2014-11-02 DIAGNOSIS — J449 Chronic obstructive pulmonary disease, unspecified: Secondary | ICD-10-CM | POA: Diagnosis not present

## 2014-11-02 DIAGNOSIS — E1151 Type 2 diabetes mellitus with diabetic peripheral angiopathy without gangrene: Secondary | ICD-10-CM | POA: Diagnosis not present

## 2014-11-02 DIAGNOSIS — I10 Essential (primary) hypertension: Secondary | ICD-10-CM | POA: Diagnosis not present

## 2014-11-02 DIAGNOSIS — Z1389 Encounter for screening for other disorder: Secondary | ICD-10-CM | POA: Diagnosis not present

## 2014-11-02 DIAGNOSIS — Z125 Encounter for screening for malignant neoplasm of prostate: Secondary | ICD-10-CM | POA: Diagnosis not present

## 2014-11-16 DIAGNOSIS — I8310 Varicose veins of unspecified lower extremity with inflammation: Secondary | ICD-10-CM | POA: Diagnosis not present

## 2014-11-16 DIAGNOSIS — L570 Actinic keratosis: Secondary | ICD-10-CM | POA: Diagnosis not present

## 2014-12-18 DIAGNOSIS — L820 Inflamed seborrheic keratosis: Secondary | ICD-10-CM | POA: Diagnosis not present

## 2014-12-18 DIAGNOSIS — I8310 Varicose veins of unspecified lower extremity with inflammation: Secondary | ICD-10-CM | POA: Diagnosis not present

## 2014-12-18 DIAGNOSIS — L570 Actinic keratosis: Secondary | ICD-10-CM | POA: Diagnosis not present

## 2015-01-25 DIAGNOSIS — I4891 Unspecified atrial fibrillation: Secondary | ICD-10-CM | POA: Diagnosis not present

## 2015-01-25 DIAGNOSIS — I509 Heart failure, unspecified: Secondary | ICD-10-CM | POA: Diagnosis not present

## 2015-01-25 DIAGNOSIS — J449 Chronic obstructive pulmonary disease, unspecified: Secondary | ICD-10-CM | POA: Diagnosis not present

## 2015-01-30 DIAGNOSIS — I8310 Varicose veins of unspecified lower extremity with inflammation: Secondary | ICD-10-CM | POA: Diagnosis not present

## 2015-01-30 DIAGNOSIS — R600 Localized edema: Secondary | ICD-10-CM | POA: Diagnosis not present

## 2015-03-05 DIAGNOSIS — H2512 Age-related nuclear cataract, left eye: Secondary | ICD-10-CM | POA: Diagnosis not present

## 2015-03-05 DIAGNOSIS — H40003 Preglaucoma, unspecified, bilateral: Secondary | ICD-10-CM | POA: Diagnosis not present

## 2015-03-06 DIAGNOSIS — I739 Peripheral vascular disease, unspecified: Secondary | ICD-10-CM | POA: Diagnosis not present

## 2015-03-06 DIAGNOSIS — J449 Chronic obstructive pulmonary disease, unspecified: Secondary | ICD-10-CM | POA: Diagnosis not present

## 2015-03-06 DIAGNOSIS — M170 Bilateral primary osteoarthritis of knee: Secondary | ICD-10-CM | POA: Diagnosis not present

## 2015-03-06 DIAGNOSIS — I2510 Atherosclerotic heart disease of native coronary artery without angina pectoris: Secondary | ICD-10-CM | POA: Diagnosis not present

## 2015-03-06 DIAGNOSIS — I10 Essential (primary) hypertension: Secondary | ICD-10-CM | POA: Diagnosis not present

## 2015-03-06 DIAGNOSIS — E782 Mixed hyperlipidemia: Secondary | ICD-10-CM | POA: Diagnosis not present

## 2015-03-06 DIAGNOSIS — K219 Gastro-esophageal reflux disease without esophagitis: Secondary | ICD-10-CM | POA: Diagnosis not present

## 2015-03-06 DIAGNOSIS — N289 Disorder of kidney and ureter, unspecified: Secondary | ICD-10-CM | POA: Diagnosis not present

## 2015-03-06 DIAGNOSIS — E1151 Type 2 diabetes mellitus with diabetic peripheral angiopathy without gangrene: Secondary | ICD-10-CM | POA: Diagnosis not present

## 2015-03-06 DIAGNOSIS — Z6835 Body mass index (BMI) 35.0-35.9, adult: Secondary | ICD-10-CM | POA: Diagnosis not present

## 2015-03-06 DIAGNOSIS — M159 Polyosteoarthritis, unspecified: Secondary | ICD-10-CM | POA: Diagnosis not present

## 2015-07-04 DIAGNOSIS — I2510 Atherosclerotic heart disease of native coronary artery without angina pectoris: Secondary | ICD-10-CM | POA: Diagnosis not present

## 2015-07-04 DIAGNOSIS — J449 Chronic obstructive pulmonary disease, unspecified: Secondary | ICD-10-CM | POA: Diagnosis not present

## 2015-07-04 DIAGNOSIS — I739 Peripheral vascular disease, unspecified: Secondary | ICD-10-CM | POA: Diagnosis not present

## 2015-07-04 DIAGNOSIS — E782 Mixed hyperlipidemia: Secondary | ICD-10-CM | POA: Diagnosis not present

## 2015-07-04 DIAGNOSIS — D692 Other nonthrombocytopenic purpura: Secondary | ICD-10-CM | POA: Diagnosis not present

## 2015-07-04 DIAGNOSIS — Z1389 Encounter for screening for other disorder: Secondary | ICD-10-CM | POA: Diagnosis not present

## 2015-07-04 DIAGNOSIS — I10 Essential (primary) hypertension: Secondary | ICD-10-CM | POA: Diagnosis not present

## 2015-07-04 DIAGNOSIS — N184 Chronic kidney disease, stage 4 (severe): Secondary | ICD-10-CM | POA: Diagnosis not present

## 2015-07-04 DIAGNOSIS — Z9181 History of falling: Secondary | ICD-10-CM | POA: Diagnosis not present

## 2015-07-04 DIAGNOSIS — K219 Gastro-esophageal reflux disease without esophagitis: Secondary | ICD-10-CM | POA: Diagnosis not present

## 2015-07-04 DIAGNOSIS — E1151 Type 2 diabetes mellitus with diabetic peripheral angiopathy without gangrene: Secondary | ICD-10-CM | POA: Diagnosis not present

## 2015-07-04 DIAGNOSIS — E1141 Type 2 diabetes mellitus with diabetic mononeuropathy: Secondary | ICD-10-CM | POA: Diagnosis not present

## 2015-07-10 DIAGNOSIS — J449 Chronic obstructive pulmonary disease, unspecified: Secondary | ICD-10-CM | POA: Diagnosis not present

## 2015-07-19 DIAGNOSIS — I482 Chronic atrial fibrillation: Secondary | ICD-10-CM | POA: Diagnosis not present

## 2015-07-19 DIAGNOSIS — R600 Localized edema: Secondary | ICD-10-CM | POA: Diagnosis not present

## 2015-07-19 DIAGNOSIS — N183 Chronic kidney disease, stage 3 (moderate): Secondary | ICD-10-CM | POA: Diagnosis not present

## 2015-07-19 DIAGNOSIS — I2510 Atherosclerotic heart disease of native coronary artery without angina pectoris: Secondary | ICD-10-CM | POA: Diagnosis not present

## 2015-07-27 DIAGNOSIS — N183 Chronic kidney disease, stage 3 (moderate): Secondary | ICD-10-CM | POA: Diagnosis not present

## 2015-07-30 DIAGNOSIS — I482 Chronic atrial fibrillation: Secondary | ICD-10-CM | POA: Diagnosis not present

## 2015-08-01 DIAGNOSIS — M7989 Other specified soft tissue disorders: Secondary | ICD-10-CM | POA: Diagnosis not present

## 2015-08-01 DIAGNOSIS — S8012XA Contusion of left lower leg, initial encounter: Secondary | ICD-10-CM | POA: Diagnosis not present

## 2015-08-01 DIAGNOSIS — L03116 Cellulitis of left lower limb: Secondary | ICD-10-CM | POA: Diagnosis not present

## 2015-08-01 DIAGNOSIS — M79605 Pain in left leg: Secondary | ICD-10-CM | POA: Diagnosis not present

## 2015-08-13 DIAGNOSIS — I482 Chronic atrial fibrillation: Secondary | ICD-10-CM | POA: Diagnosis not present

## 2015-08-13 DIAGNOSIS — I2510 Atherosclerotic heart disease of native coronary artery without angina pectoris: Secondary | ICD-10-CM | POA: Diagnosis not present

## 2015-08-13 DIAGNOSIS — E119 Type 2 diabetes mellitus without complications: Secondary | ICD-10-CM | POA: Diagnosis not present

## 2015-08-13 DIAGNOSIS — N183 Chronic kidney disease, stage 3 (moderate): Secondary | ICD-10-CM | POA: Diagnosis not present

## 2015-08-27 DIAGNOSIS — Z23 Encounter for immunization: Secondary | ICD-10-CM | POA: Diagnosis not present

## 2015-08-31 DIAGNOSIS — Z23 Encounter for immunization: Secondary | ICD-10-CM | POA: Diagnosis not present

## 2015-09-04 DIAGNOSIS — H40003 Preglaucoma, unspecified, bilateral: Secondary | ICD-10-CM | POA: Diagnosis not present

## 2015-09-13 DIAGNOSIS — M7661 Achilles tendinitis, right leg: Secondary | ICD-10-CM | POA: Diagnosis not present

## 2015-09-13 DIAGNOSIS — M722 Plantar fascial fibromatosis: Secondary | ICD-10-CM | POA: Diagnosis not present

## 2015-09-14 DIAGNOSIS — H40003 Preglaucoma, unspecified, bilateral: Secondary | ICD-10-CM | POA: Diagnosis not present

## 2015-09-26 DIAGNOSIS — H40003 Preglaucoma, unspecified, bilateral: Secondary | ICD-10-CM | POA: Diagnosis not present

## 2015-10-11 DIAGNOSIS — M722 Plantar fascial fibromatosis: Secondary | ICD-10-CM | POA: Diagnosis not present

## 2015-10-11 DIAGNOSIS — M7661 Achilles tendinitis, right leg: Secondary | ICD-10-CM | POA: Diagnosis not present

## 2015-11-05 DIAGNOSIS — N184 Chronic kidney disease, stage 4 (severe): Secondary | ICD-10-CM | POA: Diagnosis not present

## 2015-11-05 DIAGNOSIS — N3289 Other specified disorders of bladder: Secondary | ICD-10-CM | POA: Diagnosis not present

## 2015-11-05 DIAGNOSIS — Z6834 Body mass index (BMI) 34.0-34.9, adult: Secondary | ICD-10-CM | POA: Diagnosis not present

## 2015-11-05 DIAGNOSIS — K219 Gastro-esophageal reflux disease without esophagitis: Secondary | ICD-10-CM | POA: Diagnosis not present

## 2015-11-05 DIAGNOSIS — E1151 Type 2 diabetes mellitus with diabetic peripheral angiopathy without gangrene: Secondary | ICD-10-CM | POA: Diagnosis not present

## 2015-11-05 DIAGNOSIS — I10 Essential (primary) hypertension: Secondary | ICD-10-CM | POA: Diagnosis not present

## 2015-11-05 DIAGNOSIS — M170 Bilateral primary osteoarthritis of knee: Secondary | ICD-10-CM | POA: Diagnosis not present

## 2015-11-05 DIAGNOSIS — E782 Mixed hyperlipidemia: Secondary | ICD-10-CM | POA: Diagnosis not present

## 2015-11-05 DIAGNOSIS — I739 Peripheral vascular disease, unspecified: Secondary | ICD-10-CM | POA: Diagnosis not present

## 2015-11-05 DIAGNOSIS — J449 Chronic obstructive pulmonary disease, unspecified: Secondary | ICD-10-CM | POA: Diagnosis not present

## 2015-11-05 DIAGNOSIS — E669 Obesity, unspecified: Secondary | ICD-10-CM | POA: Diagnosis not present

## 2015-11-27 DIAGNOSIS — N401 Enlarged prostate with lower urinary tract symptoms: Secondary | ICD-10-CM | POA: Diagnosis not present

## 2015-11-27 DIAGNOSIS — N289 Disorder of kidney and ureter, unspecified: Secondary | ICD-10-CM | POA: Diagnosis not present

## 2015-11-27 DIAGNOSIS — N3281 Overactive bladder: Secondary | ICD-10-CM | POA: Diagnosis not present

## 2015-11-28 DIAGNOSIS — H401131 Primary open-angle glaucoma, bilateral, mild stage: Secondary | ICD-10-CM | POA: Diagnosis not present

## 2015-12-13 DIAGNOSIS — N3281 Overactive bladder: Secondary | ICD-10-CM | POA: Diagnosis not present

## 2015-12-13 DIAGNOSIS — I10 Essential (primary) hypertension: Secondary | ICD-10-CM | POA: Diagnosis not present

## 2015-12-17 DIAGNOSIS — N401 Enlarged prostate with lower urinary tract symptoms: Secondary | ICD-10-CM | POA: Diagnosis not present

## 2015-12-17 DIAGNOSIS — N3281 Overactive bladder: Secondary | ICD-10-CM | POA: Diagnosis not present

## 2015-12-17 DIAGNOSIS — C672 Malignant neoplasm of lateral wall of bladder: Secondary | ICD-10-CM | POA: Diagnosis not present

## 2015-12-21 DIAGNOSIS — N183 Chronic kidney disease, stage 3 (moderate): Secondary | ICD-10-CM | POA: Diagnosis not present

## 2015-12-21 DIAGNOSIS — I482 Chronic atrial fibrillation: Secondary | ICD-10-CM | POA: Diagnosis not present

## 2015-12-21 DIAGNOSIS — E119 Type 2 diabetes mellitus without complications: Secondary | ICD-10-CM | POA: Diagnosis not present

## 2015-12-21 DIAGNOSIS — I2510 Atherosclerotic heart disease of native coronary artery without angina pectoris: Secondary | ICD-10-CM | POA: Diagnosis not present

## 2015-12-21 DIAGNOSIS — R0609 Other forms of dyspnea: Secondary | ICD-10-CM | POA: Diagnosis not present

## 2015-12-21 DIAGNOSIS — J410 Simple chronic bronchitis: Secondary | ICD-10-CM | POA: Diagnosis not present

## 2015-12-26 DIAGNOSIS — Z01818 Encounter for other preprocedural examination: Secondary | ICD-10-CM | POA: Diagnosis not present

## 2015-12-26 DIAGNOSIS — J449 Chronic obstructive pulmonary disease, unspecified: Secondary | ICD-10-CM | POA: Diagnosis not present

## 2015-12-26 DIAGNOSIS — N329 Bladder disorder, unspecified: Secondary | ICD-10-CM | POA: Diagnosis not present

## 2015-12-26 DIAGNOSIS — Z6833 Body mass index (BMI) 33.0-33.9, adult: Secondary | ICD-10-CM | POA: Diagnosis not present

## 2015-12-26 DIAGNOSIS — I2510 Atherosclerotic heart disease of native coronary artery without angina pectoris: Secondary | ICD-10-CM | POA: Diagnosis not present

## 2015-12-26 DIAGNOSIS — N184 Chronic kidney disease, stage 4 (severe): Secondary | ICD-10-CM | POA: Diagnosis not present

## 2015-12-26 DIAGNOSIS — I739 Peripheral vascular disease, unspecified: Secondary | ICD-10-CM | POA: Diagnosis not present

## 2015-12-26 DIAGNOSIS — I10 Essential (primary) hypertension: Secondary | ICD-10-CM | POA: Diagnosis not present

## 2016-01-03 DIAGNOSIS — Z951 Presence of aortocoronary bypass graft: Secondary | ICD-10-CM | POA: Diagnosis not present

## 2016-01-03 DIAGNOSIS — E119 Type 2 diabetes mellitus without complications: Secondary | ICD-10-CM | POA: Diagnosis not present

## 2016-01-03 DIAGNOSIS — Z79899 Other long term (current) drug therapy: Secondary | ICD-10-CM | POA: Diagnosis not present

## 2016-01-03 DIAGNOSIS — I252 Old myocardial infarction: Secondary | ICD-10-CM | POA: Diagnosis not present

## 2016-01-03 DIAGNOSIS — N401 Enlarged prostate with lower urinary tract symptoms: Secondary | ICD-10-CM | POA: Diagnosis not present

## 2016-01-03 DIAGNOSIS — G4730 Sleep apnea, unspecified: Secondary | ICD-10-CM | POA: Diagnosis not present

## 2016-01-03 DIAGNOSIS — E039 Hypothyroidism, unspecified: Secondary | ICD-10-CM | POA: Diagnosis not present

## 2016-01-03 DIAGNOSIS — I4891 Unspecified atrial fibrillation: Secondary | ICD-10-CM | POA: Diagnosis not present

## 2016-01-03 DIAGNOSIS — J449 Chronic obstructive pulmonary disease, unspecified: Secondary | ICD-10-CM | POA: Diagnosis not present

## 2016-01-03 DIAGNOSIS — Z87891 Personal history of nicotine dependence: Secondary | ICD-10-CM | POA: Diagnosis not present

## 2016-01-03 DIAGNOSIS — C672 Malignant neoplasm of lateral wall of bladder: Secondary | ICD-10-CM | POA: Diagnosis not present

## 2016-01-03 DIAGNOSIS — K219 Gastro-esophageal reflux disease without esophagitis: Secondary | ICD-10-CM | POA: Diagnosis not present

## 2016-01-03 DIAGNOSIS — I129 Hypertensive chronic kidney disease with stage 1 through stage 4 chronic kidney disease, or unspecified chronic kidney disease: Secondary | ICD-10-CM | POA: Diagnosis not present

## 2016-01-03 DIAGNOSIS — R350 Frequency of micturition: Secondary | ICD-10-CM | POA: Diagnosis not present

## 2016-01-03 DIAGNOSIS — E785 Hyperlipidemia, unspecified: Secondary | ICD-10-CM | POA: Diagnosis not present

## 2016-01-03 DIAGNOSIS — D494 Neoplasm of unspecified behavior of bladder: Secondary | ICD-10-CM | POA: Diagnosis not present

## 2016-01-03 DIAGNOSIS — R3915 Urgency of urination: Secondary | ICD-10-CM | POA: Diagnosis not present

## 2016-01-03 DIAGNOSIS — J45909 Unspecified asthma, uncomplicated: Secondary | ICD-10-CM | POA: Diagnosis not present

## 2016-01-03 DIAGNOSIS — N189 Chronic kidney disease, unspecified: Secondary | ICD-10-CM | POA: Diagnosis not present

## 2016-01-03 DIAGNOSIS — Z9981 Dependence on supplemental oxygen: Secondary | ICD-10-CM | POA: Diagnosis not present

## 2016-01-03 DIAGNOSIS — Z7984 Long term (current) use of oral hypoglycemic drugs: Secondary | ICD-10-CM | POA: Diagnosis not present

## 2016-01-03 DIAGNOSIS — I10 Essential (primary) hypertension: Secondary | ICD-10-CM | POA: Diagnosis not present

## 2016-01-10 DIAGNOSIS — N401 Enlarged prostate with lower urinary tract symptoms: Secondary | ICD-10-CM | POA: Diagnosis not present

## 2016-01-10 DIAGNOSIS — C672 Malignant neoplasm of lateral wall of bladder: Secondary | ICD-10-CM | POA: Diagnosis not present

## 2016-01-27 DIAGNOSIS — S52512A Displaced fracture of left radial styloid process, initial encounter for closed fracture: Secondary | ICD-10-CM | POA: Diagnosis not present

## 2016-01-27 DIAGNOSIS — S0083XA Contusion of other part of head, initial encounter: Secondary | ICD-10-CM | POA: Diagnosis not present

## 2016-01-27 DIAGNOSIS — S199XXA Unspecified injury of neck, initial encounter: Secondary | ICD-10-CM | POA: Diagnosis not present

## 2016-01-27 DIAGNOSIS — S2242XA Multiple fractures of ribs, left side, initial encounter for closed fracture: Secondary | ICD-10-CM | POA: Diagnosis not present

## 2016-01-27 DIAGNOSIS — R937 Abnormal findings on diagnostic imaging of other parts of musculoskeletal system: Secondary | ICD-10-CM | POA: Diagnosis not present

## 2016-01-27 DIAGNOSIS — S299XXA Unspecified injury of thorax, initial encounter: Secondary | ICD-10-CM | POA: Diagnosis not present

## 2016-01-27 DIAGNOSIS — R109 Unspecified abdominal pain: Secondary | ICD-10-CM | POA: Diagnosis not present

## 2016-01-27 DIAGNOSIS — S3991XA Unspecified injury of abdomen, initial encounter: Secondary | ICD-10-CM | POA: Diagnosis not present

## 2016-01-27 DIAGNOSIS — S0003XA Contusion of scalp, initial encounter: Secondary | ICD-10-CM | POA: Diagnosis not present

## 2016-01-27 DIAGNOSIS — S80212A Abrasion, left knee, initial encounter: Secondary | ICD-10-CM | POA: Diagnosis not present

## 2016-01-27 DIAGNOSIS — S0990XA Unspecified injury of head, initial encounter: Secondary | ICD-10-CM | POA: Diagnosis not present

## 2016-01-27 DIAGNOSIS — R0781 Pleurodynia: Secondary | ICD-10-CM | POA: Diagnosis not present

## 2016-01-27 DIAGNOSIS — S01112A Laceration without foreign body of left eyelid and periocular area, initial encounter: Secondary | ICD-10-CM | POA: Diagnosis not present

## 2016-01-27 DIAGNOSIS — S0101XA Laceration without foreign body of scalp, initial encounter: Secondary | ICD-10-CM | POA: Diagnosis not present

## 2016-01-27 DIAGNOSIS — S0512XA Contusion of eyeball and orbital tissues, left eye, initial encounter: Secondary | ICD-10-CM | POA: Diagnosis not present

## 2016-01-29 DIAGNOSIS — S0592XA Unspecified injury of left eye and orbit, initial encounter: Secondary | ICD-10-CM | POA: Diagnosis not present

## 2016-01-31 DIAGNOSIS — J449 Chronic obstructive pulmonary disease, unspecified: Secondary | ICD-10-CM | POA: Diagnosis not present

## 2016-02-04 DIAGNOSIS — S52512A Displaced fracture of left radial styloid process, initial encounter for closed fracture: Secondary | ICD-10-CM | POA: Diagnosis not present

## 2016-02-13 DIAGNOSIS — C672 Malignant neoplasm of lateral wall of bladder: Secondary | ICD-10-CM | POA: Diagnosis not present

## 2016-02-13 DIAGNOSIS — N401 Enlarged prostate with lower urinary tract symptoms: Secondary | ICD-10-CM | POA: Diagnosis not present

## 2016-02-14 DIAGNOSIS — S52512A Displaced fracture of left radial styloid process, initial encounter for closed fracture: Secondary | ICD-10-CM | POA: Diagnosis not present

## 2016-02-20 DIAGNOSIS — H401131 Primary open-angle glaucoma, bilateral, mild stage: Secondary | ICD-10-CM | POA: Diagnosis not present

## 2016-03-05 DIAGNOSIS — H2512 Age-related nuclear cataract, left eye: Secondary | ICD-10-CM | POA: Diagnosis not present

## 2016-03-05 DIAGNOSIS — H26491 Other secondary cataract, right eye: Secondary | ICD-10-CM | POA: Diagnosis not present

## 2016-03-05 DIAGNOSIS — H401131 Primary open-angle glaucoma, bilateral, mild stage: Secondary | ICD-10-CM | POA: Diagnosis not present

## 2016-03-06 DIAGNOSIS — N184 Chronic kidney disease, stage 4 (severe): Secondary | ICD-10-CM | POA: Diagnosis not present

## 2016-03-06 DIAGNOSIS — Z6834 Body mass index (BMI) 34.0-34.9, adult: Secondary | ICD-10-CM | POA: Diagnosis not present

## 2016-03-06 DIAGNOSIS — I10 Essential (primary) hypertension: Secondary | ICD-10-CM | POA: Diagnosis not present

## 2016-03-06 DIAGNOSIS — E1151 Type 2 diabetes mellitus with diabetic peripheral angiopathy without gangrene: Secondary | ICD-10-CM | POA: Diagnosis not present

## 2016-03-06 DIAGNOSIS — E782 Mixed hyperlipidemia: Secondary | ICD-10-CM | POA: Diagnosis not present

## 2016-03-06 DIAGNOSIS — I739 Peripheral vascular disease, unspecified: Secondary | ICD-10-CM | POA: Diagnosis not present

## 2016-03-06 DIAGNOSIS — M159 Polyosteoarthritis, unspecified: Secondary | ICD-10-CM | POA: Diagnosis not present

## 2016-03-06 DIAGNOSIS — I2510 Atherosclerotic heart disease of native coronary artery without angina pectoris: Secondary | ICD-10-CM | POA: Diagnosis not present

## 2016-03-06 DIAGNOSIS — J449 Chronic obstructive pulmonary disease, unspecified: Secondary | ICD-10-CM | POA: Diagnosis not present

## 2016-03-06 DIAGNOSIS — D692 Other nonthrombocytopenic purpura: Secondary | ICD-10-CM | POA: Diagnosis not present

## 2016-03-06 DIAGNOSIS — Z79899 Other long term (current) drug therapy: Secondary | ICD-10-CM | POA: Diagnosis not present

## 2016-03-13 DIAGNOSIS — S52512A Displaced fracture of left radial styloid process, initial encounter for closed fracture: Secondary | ICD-10-CM | POA: Diagnosis not present

## 2016-03-13 DIAGNOSIS — S4992XA Unspecified injury of left shoulder and upper arm, initial encounter: Secondary | ICD-10-CM | POA: Diagnosis not present

## 2016-03-26 DIAGNOSIS — C672 Malignant neoplasm of lateral wall of bladder: Secondary | ICD-10-CM | POA: Diagnosis not present

## 2016-03-26 DIAGNOSIS — N401 Enlarged prostate with lower urinary tract symptoms: Secondary | ICD-10-CM | POA: Diagnosis not present

## 2016-04-24 DIAGNOSIS — S52512A Displaced fracture of left radial styloid process, initial encounter for closed fracture: Secondary | ICD-10-CM | POA: Diagnosis not present

## 2016-04-24 DIAGNOSIS — S4992XA Unspecified injury of left shoulder and upper arm, initial encounter: Secondary | ICD-10-CM | POA: Diagnosis not present

## 2016-04-30 DIAGNOSIS — N401 Enlarged prostate with lower urinary tract symptoms: Secondary | ICD-10-CM | POA: Diagnosis not present

## 2016-04-30 DIAGNOSIS — C672 Malignant neoplasm of lateral wall of bladder: Secondary | ICD-10-CM | POA: Diagnosis not present

## 2016-05-07 DIAGNOSIS — I129 Hypertensive chronic kidney disease with stage 1 through stage 4 chronic kidney disease, or unspecified chronic kidney disease: Secondary | ICD-10-CM | POA: Diagnosis present

## 2016-05-07 DIAGNOSIS — R509 Fever, unspecified: Secondary | ICD-10-CM | POA: Diagnosis not present

## 2016-05-07 DIAGNOSIS — N183 Chronic kidney disease, stage 3 (moderate): Secondary | ICD-10-CM | POA: Diagnosis present

## 2016-05-07 DIAGNOSIS — R4182 Altered mental status, unspecified: Secondary | ICD-10-CM | POA: Diagnosis not present

## 2016-05-07 DIAGNOSIS — Z951 Presence of aortocoronary bypass graft: Secondary | ICD-10-CM | POA: Diagnosis not present

## 2016-05-07 DIAGNOSIS — Z888 Allergy status to other drugs, medicaments and biological substances status: Secondary | ICD-10-CM | POA: Diagnosis not present

## 2016-05-07 DIAGNOSIS — J189 Pneumonia, unspecified organism: Secondary | ICD-10-CM | POA: Diagnosis not present

## 2016-05-07 DIAGNOSIS — Z7982 Long term (current) use of aspirin: Secondary | ICD-10-CM | POA: Diagnosis not present

## 2016-05-07 DIAGNOSIS — I480 Paroxysmal atrial fibrillation: Secondary | ICD-10-CM | POA: Diagnosis not present

## 2016-05-07 DIAGNOSIS — N200 Calculus of kidney: Secondary | ICD-10-CM | POA: Diagnosis present

## 2016-05-07 DIAGNOSIS — J9811 Atelectasis: Secondary | ICD-10-CM | POA: Diagnosis not present

## 2016-05-07 DIAGNOSIS — J9621 Acute and chronic respiratory failure with hypoxia: Secondary | ICD-10-CM | POA: Diagnosis not present

## 2016-05-07 DIAGNOSIS — I2510 Atherosclerotic heart disease of native coronary artery without angina pectoris: Secondary | ICD-10-CM

## 2016-05-07 DIAGNOSIS — M1990 Unspecified osteoarthritis, unspecified site: Secondary | ICD-10-CM | POA: Diagnosis not present

## 2016-05-07 DIAGNOSIS — J440 Chronic obstructive pulmonary disease with acute lower respiratory infection: Secondary | ICD-10-CM | POA: Diagnosis not present

## 2016-05-07 DIAGNOSIS — E039 Hypothyroidism, unspecified: Secondary | ICD-10-CM | POA: Diagnosis not present

## 2016-05-07 DIAGNOSIS — E785 Hyperlipidemia, unspecified: Secondary | ICD-10-CM

## 2016-05-07 DIAGNOSIS — I517 Cardiomegaly: Secondary | ICD-10-CM | POA: Diagnosis not present

## 2016-05-07 DIAGNOSIS — I4519 Other right bundle-branch block: Secondary | ICD-10-CM | POA: Diagnosis not present

## 2016-05-07 DIAGNOSIS — I340 Nonrheumatic mitral (valve) insufficiency: Secondary | ICD-10-CM | POA: Diagnosis not present

## 2016-05-07 DIAGNOSIS — I25810 Atherosclerosis of coronary artery bypass graft(s) without angina pectoris: Secondary | ICD-10-CM | POA: Diagnosis present

## 2016-05-07 DIAGNOSIS — F4489 Other dissociative and conversion disorders: Secondary | ICD-10-CM | POA: Diagnosis not present

## 2016-05-07 DIAGNOSIS — Z880 Allergy status to penicillin: Secondary | ICD-10-CM | POA: Diagnosis not present

## 2016-05-07 DIAGNOSIS — R918 Other nonspecific abnormal finding of lung field: Secondary | ICD-10-CM | POA: Diagnosis not present

## 2016-05-07 DIAGNOSIS — E1122 Type 2 diabetes mellitus with diabetic chronic kidney disease: Secondary | ICD-10-CM | POA: Diagnosis not present

## 2016-05-07 DIAGNOSIS — Z79899 Other long term (current) drug therapy: Secondary | ICD-10-CM | POA: Diagnosis not present

## 2016-05-07 DIAGNOSIS — Z885 Allergy status to narcotic agent status: Secondary | ICD-10-CM | POA: Diagnosis not present

## 2016-05-07 DIAGNOSIS — E119 Type 2 diabetes mellitus without complications: Secondary | ICD-10-CM

## 2016-05-07 DIAGNOSIS — J45909 Unspecified asthma, uncomplicated: Secondary | ICD-10-CM | POA: Diagnosis present

## 2016-05-07 DIAGNOSIS — I272 Other secondary pulmonary hypertension: Secondary | ICD-10-CM | POA: Diagnosis not present

## 2016-05-07 DIAGNOSIS — Z6830 Body mass index (BMI) 30.0-30.9, adult: Secondary | ICD-10-CM | POA: Diagnosis not present

## 2016-05-07 DIAGNOSIS — J441 Chronic obstructive pulmonary disease with (acute) exacerbation: Secondary | ICD-10-CM | POA: Diagnosis present

## 2016-05-07 DIAGNOSIS — J159 Unspecified bacterial pneumonia: Secondary | ICD-10-CM | POA: Diagnosis not present

## 2016-05-07 DIAGNOSIS — I5033 Acute on chronic diastolic (congestive) heart failure: Secondary | ICD-10-CM | POA: Diagnosis not present

## 2016-05-07 DIAGNOSIS — N400 Enlarged prostate without lower urinary tract symptoms: Secondary | ICD-10-CM | POA: Diagnosis present

## 2016-05-07 DIAGNOSIS — J449 Chronic obstructive pulmonary disease, unspecified: Secondary | ICD-10-CM

## 2016-05-19 DIAGNOSIS — J029 Acute pharyngitis, unspecified: Secondary | ICD-10-CM | POA: Diagnosis not present

## 2016-05-19 DIAGNOSIS — Z6832 Body mass index (BMI) 32.0-32.9, adult: Secondary | ICD-10-CM | POA: Diagnosis not present

## 2016-05-19 DIAGNOSIS — E669 Obesity, unspecified: Secondary | ICD-10-CM | POA: Diagnosis not present

## 2016-05-19 DIAGNOSIS — R0902 Hypoxemia: Secondary | ICD-10-CM | POA: Diagnosis not present

## 2016-05-19 DIAGNOSIS — J189 Pneumonia, unspecified organism: Secondary | ICD-10-CM | POA: Diagnosis not present

## 2016-05-19 DIAGNOSIS — I10 Essential (primary) hypertension: Secondary | ICD-10-CM | POA: Diagnosis not present

## 2016-05-19 DIAGNOSIS — E1151 Type 2 diabetes mellitus with diabetic peripheral angiopathy without gangrene: Secondary | ICD-10-CM | POA: Diagnosis not present

## 2016-05-19 DIAGNOSIS — J449 Chronic obstructive pulmonary disease, unspecified: Secondary | ICD-10-CM | POA: Diagnosis not present

## 2016-06-11 DIAGNOSIS — H524 Presbyopia: Secondary | ICD-10-CM | POA: Diagnosis not present

## 2016-06-11 DIAGNOSIS — H401131 Primary open-angle glaucoma, bilateral, mild stage: Secondary | ICD-10-CM | POA: Diagnosis not present

## 2016-06-11 DIAGNOSIS — H26491 Other secondary cataract, right eye: Secondary | ICD-10-CM | POA: Diagnosis not present

## 2016-06-11 DIAGNOSIS — C672 Malignant neoplasm of lateral wall of bladder: Secondary | ICD-10-CM | POA: Diagnosis not present

## 2016-06-11 DIAGNOSIS — H25812 Combined forms of age-related cataract, left eye: Secondary | ICD-10-CM | POA: Diagnosis not present

## 2016-06-18 DIAGNOSIS — E1151 Type 2 diabetes mellitus with diabetic peripheral angiopathy without gangrene: Secondary | ICD-10-CM | POA: Diagnosis not present

## 2016-06-18 DIAGNOSIS — I739 Peripheral vascular disease, unspecified: Secondary | ICD-10-CM | POA: Diagnosis not present

## 2016-06-18 DIAGNOSIS — M159 Polyosteoarthritis, unspecified: Secondary | ICD-10-CM | POA: Diagnosis not present

## 2016-06-18 DIAGNOSIS — Z01818 Encounter for other preprocedural examination: Secondary | ICD-10-CM | POA: Diagnosis not present

## 2016-06-18 DIAGNOSIS — I10 Essential (primary) hypertension: Secondary | ICD-10-CM | POA: Diagnosis not present

## 2016-06-18 DIAGNOSIS — Z79899 Other long term (current) drug therapy: Secondary | ICD-10-CM | POA: Diagnosis not present

## 2016-06-18 DIAGNOSIS — I2510 Atherosclerotic heart disease of native coronary artery without angina pectoris: Secondary | ICD-10-CM | POA: Diagnosis not present

## 2016-06-18 DIAGNOSIS — N184 Chronic kidney disease, stage 4 (severe): Secondary | ICD-10-CM | POA: Diagnosis not present

## 2016-06-18 DIAGNOSIS — N329 Bladder disorder, unspecified: Secondary | ICD-10-CM | POA: Diagnosis not present

## 2016-06-18 DIAGNOSIS — E782 Mixed hyperlipidemia: Secondary | ICD-10-CM | POA: Diagnosis not present

## 2016-06-18 DIAGNOSIS — J449 Chronic obstructive pulmonary disease, unspecified: Secondary | ICD-10-CM | POA: Diagnosis not present

## 2016-06-18 DIAGNOSIS — D649 Anemia, unspecified: Secondary | ICD-10-CM | POA: Diagnosis not present

## 2016-06-25 DIAGNOSIS — N3281 Overactive bladder: Secondary | ICD-10-CM | POA: Diagnosis not present

## 2016-06-25 DIAGNOSIS — N401 Enlarged prostate with lower urinary tract symptoms: Secondary | ICD-10-CM | POA: Diagnosis not present

## 2016-06-25 DIAGNOSIS — C672 Malignant neoplasm of lateral wall of bladder: Secondary | ICD-10-CM | POA: Diagnosis not present

## 2016-09-16 DIAGNOSIS — H401131 Primary open-angle glaucoma, bilateral, mild stage: Secondary | ICD-10-CM | POA: Diagnosis not present

## 2016-09-23 DIAGNOSIS — Z23 Encounter for immunization: Secondary | ICD-10-CM | POA: Diagnosis not present

## 2016-09-25 DIAGNOSIS — C672 Malignant neoplasm of lateral wall of bladder: Secondary | ICD-10-CM | POA: Diagnosis not present

## 2016-09-25 DIAGNOSIS — N401 Enlarged prostate with lower urinary tract symptoms: Secondary | ICD-10-CM | POA: Diagnosis not present

## 2016-10-14 DIAGNOSIS — K210 Gastro-esophageal reflux disease with esophagitis: Secondary | ICD-10-CM | POA: Diagnosis not present

## 2016-10-14 DIAGNOSIS — I2510 Atherosclerotic heart disease of native coronary artery without angina pectoris: Secondary | ICD-10-CM | POA: Diagnosis not present

## 2016-10-14 DIAGNOSIS — N184 Chronic kidney disease, stage 4 (severe): Secondary | ICD-10-CM | POA: Diagnosis not present

## 2016-10-14 DIAGNOSIS — I480 Paroxysmal atrial fibrillation: Secondary | ICD-10-CM | POA: Diagnosis not present

## 2016-10-21 DIAGNOSIS — Z9181 History of falling: Secondary | ICD-10-CM | POA: Diagnosis not present

## 2016-10-21 DIAGNOSIS — I2510 Atherosclerotic heart disease of native coronary artery without angina pectoris: Secondary | ICD-10-CM | POA: Diagnosis not present

## 2016-10-21 DIAGNOSIS — E782 Mixed hyperlipidemia: Secondary | ICD-10-CM | POA: Diagnosis not present

## 2016-10-21 DIAGNOSIS — I739 Peripheral vascular disease, unspecified: Secondary | ICD-10-CM | POA: Diagnosis not present

## 2016-10-21 DIAGNOSIS — Z1389 Encounter for screening for other disorder: Secondary | ICD-10-CM | POA: Diagnosis not present

## 2016-10-21 DIAGNOSIS — M159 Polyosteoarthritis, unspecified: Secondary | ICD-10-CM | POA: Diagnosis not present

## 2016-10-21 DIAGNOSIS — D692 Other nonthrombocytopenic purpura: Secondary | ICD-10-CM | POA: Diagnosis not present

## 2016-10-21 DIAGNOSIS — I10 Essential (primary) hypertension: Secondary | ICD-10-CM | POA: Diagnosis not present

## 2016-10-21 DIAGNOSIS — N184 Chronic kidney disease, stage 4 (severe): Secondary | ICD-10-CM | POA: Diagnosis not present

## 2016-10-21 DIAGNOSIS — J449 Chronic obstructive pulmonary disease, unspecified: Secondary | ICD-10-CM | POA: Diagnosis not present

## 2016-10-21 DIAGNOSIS — Z6834 Body mass index (BMI) 34.0-34.9, adult: Secondary | ICD-10-CM | POA: Diagnosis not present

## 2016-10-21 DIAGNOSIS — E1151 Type 2 diabetes mellitus with diabetic peripheral angiopathy without gangrene: Secondary | ICD-10-CM | POA: Diagnosis not present

## 2016-10-28 DIAGNOSIS — N3281 Overactive bladder: Secondary | ICD-10-CM | POA: Diagnosis not present

## 2016-10-28 DIAGNOSIS — R310 Gross hematuria: Secondary | ICD-10-CM | POA: Diagnosis not present

## 2016-10-28 DIAGNOSIS — N401 Enlarged prostate with lower urinary tract symptoms: Secondary | ICD-10-CM | POA: Diagnosis not present

## 2016-11-09 DIAGNOSIS — R0602 Shortness of breath: Secondary | ICD-10-CM | POA: Diagnosis not present

## 2016-11-09 DIAGNOSIS — S81812A Laceration without foreign body, left lower leg, initial encounter: Secondary | ICD-10-CM | POA: Diagnosis not present

## 2016-11-11 DIAGNOSIS — R0902 Hypoxemia: Secondary | ICD-10-CM | POA: Diagnosis not present

## 2016-11-11 DIAGNOSIS — Z87891 Personal history of nicotine dependence: Secondary | ICD-10-CM | POA: Diagnosis not present

## 2016-11-11 DIAGNOSIS — J449 Chronic obstructive pulmonary disease, unspecified: Secondary | ICD-10-CM | POA: Insufficient documentation

## 2016-11-11 DIAGNOSIS — I4891 Unspecified atrial fibrillation: Secondary | ICD-10-CM | POA: Diagnosis not present

## 2016-11-11 DIAGNOSIS — R0600 Dyspnea, unspecified: Secondary | ICD-10-CM | POA: Insufficient documentation

## 2016-11-11 DIAGNOSIS — E119 Type 2 diabetes mellitus without complications: Secondary | ICD-10-CM | POA: Insufficient documentation

## 2016-11-11 DIAGNOSIS — R0609 Other forms of dyspnea: Secondary | ICD-10-CM | POA: Insufficient documentation

## 2016-11-11 DIAGNOSIS — I4892 Unspecified atrial flutter: Secondary | ICD-10-CM | POA: Insufficient documentation

## 2016-11-11 DIAGNOSIS — J984 Other disorders of lung: Secondary | ICD-10-CM | POA: Insufficient documentation

## 2016-11-11 HISTORY — DX: Dyspnea, unspecified: R06.00

## 2016-11-11 HISTORY — DX: Type 2 diabetes mellitus without complications: E11.9

## 2016-11-11 HISTORY — DX: Other forms of dyspnea: R06.09

## 2016-11-14 DIAGNOSIS — J439 Emphysema, unspecified: Secondary | ICD-10-CM | POA: Diagnosis not present

## 2016-11-20 DIAGNOSIS — C672 Malignant neoplasm of lateral wall of bladder: Secondary | ICD-10-CM | POA: Diagnosis not present

## 2016-11-20 DIAGNOSIS — Z6832 Body mass index (BMI) 32.0-32.9, adult: Secondary | ICD-10-CM | POA: Diagnosis not present

## 2016-11-20 DIAGNOSIS — C679 Malignant neoplasm of bladder, unspecified: Secondary | ICD-10-CM | POA: Diagnosis not present

## 2016-11-20 DIAGNOSIS — I10 Essential (primary) hypertension: Secondary | ICD-10-CM | POA: Diagnosis not present

## 2016-11-20 DIAGNOSIS — Z792 Long term (current) use of antibiotics: Secondary | ICD-10-CM | POA: Diagnosis not present

## 2016-11-20 DIAGNOSIS — J449 Chronic obstructive pulmonary disease, unspecified: Secondary | ICD-10-CM | POA: Diagnosis not present

## 2016-11-20 DIAGNOSIS — I2510 Atherosclerotic heart disease of native coronary artery without angina pectoris: Secondary | ICD-10-CM | POA: Diagnosis not present

## 2016-11-20 DIAGNOSIS — Z7982 Long term (current) use of aspirin: Secondary | ICD-10-CM | POA: Diagnosis not present

## 2016-11-20 DIAGNOSIS — Z7901 Long term (current) use of anticoagulants: Secondary | ICD-10-CM | POA: Diagnosis not present

## 2016-11-20 DIAGNOSIS — C675 Malignant neoplasm of bladder neck: Secondary | ICD-10-CM | POA: Diagnosis not present

## 2016-11-20 DIAGNOSIS — Z79899 Other long term (current) drug therapy: Secondary | ICD-10-CM | POA: Diagnosis not present

## 2016-11-20 DIAGNOSIS — R310 Gross hematuria: Secondary | ICD-10-CM | POA: Diagnosis not present

## 2016-11-20 DIAGNOSIS — E669 Obesity, unspecified: Secondary | ICD-10-CM | POA: Diagnosis not present

## 2016-11-20 DIAGNOSIS — C678 Malignant neoplasm of overlapping sites of bladder: Secondary | ICD-10-CM | POA: Diagnosis not present

## 2016-11-20 DIAGNOSIS — D494 Neoplasm of unspecified behavior of bladder: Secondary | ICD-10-CM | POA: Diagnosis not present

## 2016-11-20 DIAGNOSIS — I252 Old myocardial infarction: Secondary | ICD-10-CM | POA: Diagnosis not present

## 2016-11-27 DIAGNOSIS — I480 Paroxysmal atrial fibrillation: Secondary | ICD-10-CM | POA: Diagnosis not present

## 2016-11-27 DIAGNOSIS — R002 Palpitations: Secondary | ICD-10-CM | POA: Diagnosis not present

## 2016-11-27 DIAGNOSIS — R9431 Abnormal electrocardiogram [ECG] [EKG]: Secondary | ICD-10-CM | POA: Diagnosis not present

## 2016-12-02 DIAGNOSIS — C672 Malignant neoplasm of lateral wall of bladder: Secondary | ICD-10-CM | POA: Diagnosis not present

## 2016-12-05 DIAGNOSIS — I4891 Unspecified atrial fibrillation: Secondary | ICD-10-CM | POA: Diagnosis not present

## 2016-12-16 DIAGNOSIS — H532 Diplopia: Secondary | ICD-10-CM | POA: Diagnosis not present

## 2016-12-16 DIAGNOSIS — H25812 Combined forms of age-related cataract, left eye: Secondary | ICD-10-CM | POA: Diagnosis not present

## 2016-12-16 DIAGNOSIS — H401131 Primary open-angle glaucoma, bilateral, mild stage: Secondary | ICD-10-CM | POA: Diagnosis not present

## 2016-12-16 DIAGNOSIS — H26491 Other secondary cataract, right eye: Secondary | ICD-10-CM | POA: Diagnosis not present

## 2016-12-23 DIAGNOSIS — J449 Chronic obstructive pulmonary disease, unspecified: Secondary | ICD-10-CM | POA: Diagnosis present

## 2016-12-23 DIAGNOSIS — Z87891 Personal history of nicotine dependence: Secondary | ICD-10-CM | POA: Diagnosis not present

## 2016-12-23 DIAGNOSIS — E119 Type 2 diabetes mellitus without complications: Secondary | ICD-10-CM | POA: Diagnosis not present

## 2016-12-23 DIAGNOSIS — I481 Persistent atrial fibrillation: Secondary | ICD-10-CM | POA: Diagnosis not present

## 2016-12-23 DIAGNOSIS — I517 Cardiomegaly: Secondary | ICD-10-CM | POA: Diagnosis not present

## 2016-12-23 DIAGNOSIS — N2889 Other specified disorders of kidney and ureter: Secondary | ICD-10-CM | POA: Diagnosis not present

## 2016-12-23 DIAGNOSIS — Z888 Allergy status to other drugs, medicaments and biological substances status: Secondary | ICD-10-CM | POA: Diagnosis not present

## 2016-12-23 DIAGNOSIS — Z8249 Family history of ischemic heart disease and other diseases of the circulatory system: Secondary | ICD-10-CM | POA: Diagnosis not present

## 2016-12-23 DIAGNOSIS — I480 Paroxysmal atrial fibrillation: Secondary | ICD-10-CM | POA: Diagnosis present

## 2016-12-23 DIAGNOSIS — Z885 Allergy status to narcotic agent status: Secondary | ICD-10-CM | POA: Diagnosis not present

## 2016-12-23 DIAGNOSIS — C679 Malignant neoplasm of bladder, unspecified: Secondary | ICD-10-CM

## 2016-12-23 DIAGNOSIS — I2510 Atherosclerotic heart disease of native coronary artery without angina pectoris: Secondary | ICD-10-CM | POA: Diagnosis not present

## 2016-12-23 DIAGNOSIS — R0789 Other chest pain: Secondary | ICD-10-CM | POA: Diagnosis not present

## 2016-12-23 DIAGNOSIS — I25810 Atherosclerosis of coronary artery bypass graft(s) without angina pectoris: Secondary | ICD-10-CM | POA: Diagnosis present

## 2016-12-23 DIAGNOSIS — M7989 Other specified soft tissue disorders: Secondary | ICD-10-CM | POA: Diagnosis not present

## 2016-12-23 DIAGNOSIS — Z880 Allergy status to penicillin: Secondary | ICD-10-CM | POA: Diagnosis not present

## 2016-12-23 DIAGNOSIS — M546 Pain in thoracic spine: Secondary | ICD-10-CM | POA: Diagnosis not present

## 2016-12-23 DIAGNOSIS — Z79899 Other long term (current) drug therapy: Secondary | ICD-10-CM | POA: Diagnosis not present

## 2016-12-23 DIAGNOSIS — I5033 Acute on chronic diastolic (congestive) heart failure: Secondary | ICD-10-CM | POA: Diagnosis present

## 2016-12-23 DIAGNOSIS — I252 Old myocardial infarction: Secondary | ICD-10-CM | POA: Diagnosis not present

## 2016-12-23 DIAGNOSIS — I509 Heart failure, unspecified: Secondary | ICD-10-CM | POA: Diagnosis not present

## 2016-12-23 DIAGNOSIS — E782 Mixed hyperlipidemia: Secondary | ICD-10-CM | POA: Diagnosis present

## 2016-12-23 DIAGNOSIS — K219 Gastro-esophageal reflux disease without esophagitis: Secondary | ICD-10-CM | POA: Diagnosis present

## 2016-12-23 DIAGNOSIS — Z7982 Long term (current) use of aspirin: Secondary | ICD-10-CM | POA: Diagnosis not present

## 2016-12-23 DIAGNOSIS — I110 Hypertensive heart disease with heart failure: Secondary | ICD-10-CM | POA: Diagnosis not present

## 2016-12-23 DIAGNOSIS — E875 Hyperkalemia: Secondary | ICD-10-CM

## 2016-12-23 DIAGNOSIS — Z951 Presence of aortocoronary bypass graft: Secondary | ICD-10-CM | POA: Diagnosis not present

## 2016-12-23 DIAGNOSIS — F17211 Nicotine dependence, cigarettes, in remission: Secondary | ICD-10-CM | POA: Diagnosis not present

## 2016-12-23 DIAGNOSIS — N183 Chronic kidney disease, stage 3 (moderate): Secondary | ICD-10-CM | POA: Diagnosis not present

## 2016-12-23 DIAGNOSIS — R0602 Shortness of breath: Secondary | ICD-10-CM | POA: Diagnosis not present

## 2016-12-23 DIAGNOSIS — R0902 Hypoxemia: Secondary | ICD-10-CM | POA: Diagnosis not present

## 2016-12-23 DIAGNOSIS — E1122 Type 2 diabetes mellitus with diabetic chronic kidney disease: Secondary | ICD-10-CM | POA: Diagnosis present

## 2016-12-23 DIAGNOSIS — E039 Hypothyroidism, unspecified: Secondary | ICD-10-CM

## 2016-12-23 DIAGNOSIS — I130 Hypertensive heart and chronic kidney disease with heart failure and stage 1 through stage 4 chronic kidney disease, or unspecified chronic kidney disease: Secondary | ICD-10-CM | POA: Diagnosis present

## 2016-12-29 DIAGNOSIS — S3991XA Unspecified injury of abdomen, initial encounter: Secondary | ICD-10-CM | POA: Diagnosis not present

## 2016-12-29 DIAGNOSIS — S2241XA Multiple fractures of ribs, right side, initial encounter for closed fracture: Secondary | ICD-10-CM | POA: Diagnosis not present

## 2016-12-29 DIAGNOSIS — S0510XA Contusion of eyeball and orbital tissues, unspecified eye, initial encounter: Secondary | ICD-10-CM | POA: Diagnosis not present

## 2016-12-29 DIAGNOSIS — S22050A Wedge compression fracture of T5-T6 vertebra, initial encounter for closed fracture: Secondary | ICD-10-CM | POA: Diagnosis not present

## 2016-12-29 DIAGNOSIS — T404X5A Adverse effect of other synthetic narcotics, initial encounter: Secondary | ICD-10-CM | POA: Diagnosis not present

## 2016-12-29 DIAGNOSIS — Z9889 Other specified postprocedural states: Secondary | ICD-10-CM | POA: Diagnosis not present

## 2016-12-29 DIAGNOSIS — S4992XA Unspecified injury of left shoulder and upper arm, initial encounter: Secondary | ICD-10-CM | POA: Diagnosis not present

## 2016-12-29 DIAGNOSIS — S3993XA Unspecified injury of pelvis, initial encounter: Secondary | ICD-10-CM | POA: Diagnosis not present

## 2016-12-29 DIAGNOSIS — R55 Syncope and collapse: Secondary | ICD-10-CM | POA: Diagnosis not present

## 2016-12-29 DIAGNOSIS — I469 Cardiac arrest, cause unspecified: Secondary | ICD-10-CM | POA: Diagnosis not present

## 2016-12-29 DIAGNOSIS — I959 Hypotension, unspecified: Secondary | ICD-10-CM | POA: Diagnosis not present

## 2016-12-29 DIAGNOSIS — I509 Heart failure, unspecified: Secondary | ICD-10-CM | POA: Diagnosis not present

## 2016-12-29 DIAGNOSIS — I4891 Unspecified atrial fibrillation: Secondary | ICD-10-CM | POA: Diagnosis not present

## 2016-12-29 DIAGNOSIS — S59902A Unspecified injury of left elbow, initial encounter: Secondary | ICD-10-CM | POA: Diagnosis not present

## 2016-12-29 DIAGNOSIS — S0990XA Unspecified injury of head, initial encounter: Secondary | ICD-10-CM | POA: Diagnosis not present

## 2016-12-29 DIAGNOSIS — S22000A Wedge compression fracture of unspecified thoracic vertebra, initial encounter for closed fracture: Secondary | ICD-10-CM | POA: Diagnosis not present

## 2016-12-29 DIAGNOSIS — J449 Chronic obstructive pulmonary disease, unspecified: Secondary | ICD-10-CM | POA: Diagnosis not present

## 2016-12-29 DIAGNOSIS — S8991XA Unspecified injury of right lower leg, initial encounter: Secondary | ICD-10-CM | POA: Diagnosis not present

## 2016-12-29 DIAGNOSIS — I2510 Atherosclerotic heart disease of native coronary artery without angina pectoris: Secondary | ICD-10-CM | POA: Diagnosis not present

## 2016-12-29 DIAGNOSIS — S0540XA Penetrating wound of orbit with or without foreign body, unspecified eye, initial encounter: Secondary | ICD-10-CM | POA: Diagnosis not present

## 2016-12-29 DIAGNOSIS — I110 Hypertensive heart disease with heart failure: Secondary | ICD-10-CM | POA: Diagnosis not present

## 2016-12-29 DIAGNOSIS — S2243XA Multiple fractures of ribs, bilateral, initial encounter for closed fracture: Secondary | ICD-10-CM | POA: Diagnosis not present

## 2016-12-29 DIAGNOSIS — S199XXA Unspecified injury of neck, initial encounter: Secondary | ICD-10-CM | POA: Diagnosis not present

## 2016-12-29 DIAGNOSIS — R0602 Shortness of breath: Secondary | ICD-10-CM | POA: Diagnosis not present

## 2016-12-30 DIAGNOSIS — S299XXA Unspecified injury of thorax, initial encounter: Secondary | ICD-10-CM | POA: Diagnosis not present

## 2016-12-30 DIAGNOSIS — S0540XA Penetrating wound of orbit with or without foreign body, unspecified eye, initial encounter: Secondary | ICD-10-CM | POA: Diagnosis not present

## 2016-12-30 DIAGNOSIS — E1122 Type 2 diabetes mellitus with diabetic chronic kidney disease: Secondary | ICD-10-CM | POA: Diagnosis not present

## 2016-12-30 DIAGNOSIS — S0990XA Unspecified injury of head, initial encounter: Secondary | ICD-10-CM | POA: Diagnosis not present

## 2016-12-30 DIAGNOSIS — K219 Gastro-esophageal reflux disease without esophagitis: Secondary | ICD-10-CM | POA: Diagnosis not present

## 2016-12-30 DIAGNOSIS — S199XXA Unspecified injury of neck, initial encounter: Secondary | ICD-10-CM | POA: Diagnosis not present

## 2016-12-30 DIAGNOSIS — S22058A Other fracture of T5-T6 vertebra, initial encounter for closed fracture: Secondary | ICD-10-CM | POA: Diagnosis not present

## 2016-12-30 DIAGNOSIS — T50905A Adverse effect of unspecified drugs, medicaments and biological substances, initial encounter: Secondary | ICD-10-CM | POA: Diagnosis not present

## 2016-12-30 DIAGNOSIS — N183 Chronic kidney disease, stage 3 (moderate): Secondary | ICD-10-CM | POA: Diagnosis not present

## 2016-12-30 DIAGNOSIS — R55 Syncope and collapse: Secondary | ICD-10-CM | POA: Diagnosis not present

## 2016-12-30 DIAGNOSIS — S0010XA Contusion of unspecified eyelid and periocular area, initial encounter: Secondary | ICD-10-CM | POA: Diagnosis not present

## 2016-12-30 DIAGNOSIS — Z7982 Long term (current) use of aspirin: Secondary | ICD-10-CM | POA: Diagnosis not present

## 2016-12-30 DIAGNOSIS — R0681 Apnea, not elsewhere classified: Secondary | ICD-10-CM | POA: Diagnosis not present

## 2016-12-30 DIAGNOSIS — S2243XA Multiple fractures of ribs, bilateral, initial encounter for closed fracture: Secondary | ICD-10-CM | POA: Diagnosis not present

## 2016-12-30 DIAGNOSIS — S8991XA Unspecified injury of right lower leg, initial encounter: Secondary | ICD-10-CM | POA: Diagnosis not present

## 2016-12-30 DIAGNOSIS — S098XXA Other specified injuries of head, initial encounter: Secondary | ICD-10-CM | POA: Diagnosis not present

## 2016-12-30 DIAGNOSIS — S279XXA Injury of unspecified intrathoracic organ, initial encounter: Secondary | ICD-10-CM | POA: Diagnosis not present

## 2016-12-30 DIAGNOSIS — T404X5A Adverse effect of other synthetic narcotics, initial encounter: Secondary | ICD-10-CM | POA: Diagnosis not present

## 2016-12-30 DIAGNOSIS — J449 Chronic obstructive pulmonary disease, unspecified: Secondary | ICD-10-CM | POA: Diagnosis not present

## 2016-12-30 DIAGNOSIS — S4992XA Unspecified injury of left shoulder and upper arm, initial encounter: Secondary | ICD-10-CM | POA: Diagnosis not present

## 2016-12-30 DIAGNOSIS — I10 Essential (primary) hypertension: Secondary | ICD-10-CM | POA: Diagnosis not present

## 2016-12-30 DIAGNOSIS — I2510 Atherosclerotic heart disease of native coronary artery without angina pectoris: Secondary | ICD-10-CM | POA: Diagnosis not present

## 2016-12-30 DIAGNOSIS — S59902A Unspecified injury of left elbow, initial encounter: Secondary | ICD-10-CM | POA: Diagnosis not present

## 2016-12-30 DIAGNOSIS — Z79899 Other long term (current) drug therapy: Secondary | ICD-10-CM | POA: Diagnosis not present

## 2016-12-30 DIAGNOSIS — R51 Headache: Secondary | ICD-10-CM | POA: Diagnosis not present

## 2016-12-30 DIAGNOSIS — R092 Respiratory arrest: Secondary | ICD-10-CM | POA: Diagnosis not present

## 2016-12-30 DIAGNOSIS — I252 Old myocardial infarction: Secondary | ICD-10-CM | POA: Diagnosis not present

## 2016-12-30 DIAGNOSIS — S22050A Wedge compression fracture of T5-T6 vertebra, initial encounter for closed fracture: Secondary | ICD-10-CM | POA: Diagnosis not present

## 2016-12-30 DIAGNOSIS — I129 Hypertensive chronic kidney disease with stage 1 through stage 4 chronic kidney disease, or unspecified chronic kidney disease: Secondary | ICD-10-CM | POA: Diagnosis not present

## 2016-12-30 DIAGNOSIS — S3991XA Unspecified injury of abdomen, initial encounter: Secondary | ICD-10-CM | POA: Diagnosis not present

## 2016-12-30 DIAGNOSIS — S3993XA Unspecified injury of pelvis, initial encounter: Secondary | ICD-10-CM | POA: Diagnosis not present

## 2016-12-30 DIAGNOSIS — I959 Hypotension, unspecified: Secondary | ICD-10-CM | POA: Diagnosis not present

## 2016-12-30 DIAGNOSIS — Z87891 Personal history of nicotine dependence: Secondary | ICD-10-CM | POA: Diagnosis not present

## 2016-12-30 DIAGNOSIS — R110 Nausea: Secondary | ICD-10-CM | POA: Diagnosis not present

## 2016-12-30 DIAGNOSIS — S2241XA Multiple fractures of ribs, right side, initial encounter for closed fracture: Secondary | ICD-10-CM | POA: Diagnosis not present

## 2016-12-31 DIAGNOSIS — J810 Acute pulmonary edema: Secondary | ICD-10-CM | POA: Diagnosis not present

## 2016-12-31 DIAGNOSIS — J984 Other disorders of lung: Secondary | ICD-10-CM | POA: Diagnosis not present

## 2016-12-31 DIAGNOSIS — R1010 Upper abdominal pain, unspecified: Secondary | ICD-10-CM | POA: Diagnosis not present

## 2016-12-31 DIAGNOSIS — G8911 Acute pain due to trauma: Secondary | ICD-10-CM | POA: Diagnosis not present

## 2016-12-31 DIAGNOSIS — R140 Abdominal distension (gaseous): Secondary | ICD-10-CM | POA: Diagnosis not present

## 2016-12-31 DIAGNOSIS — Z7409 Other reduced mobility: Secondary | ICD-10-CM | POA: Diagnosis not present

## 2016-12-31 DIAGNOSIS — Z951 Presence of aortocoronary bypass graft: Secondary | ICD-10-CM | POA: Diagnosis not present

## 2016-12-31 DIAGNOSIS — L97819 Non-pressure chronic ulcer of other part of right lower leg with unspecified severity: Secondary | ICD-10-CM | POA: Diagnosis not present

## 2016-12-31 DIAGNOSIS — S2243XA Multiple fractures of ribs, bilateral, initial encounter for closed fracture: Secondary | ICD-10-CM | POA: Diagnosis not present

## 2016-12-31 DIAGNOSIS — I4510 Unspecified right bundle-branch block: Secondary | ICD-10-CM | POA: Diagnosis not present

## 2016-12-31 DIAGNOSIS — E785 Hyperlipidemia, unspecified: Secondary | ICD-10-CM

## 2016-12-31 DIAGNOSIS — R404 Transient alteration of awareness: Secondary | ICD-10-CM | POA: Diagnosis not present

## 2016-12-31 DIAGNOSIS — R402360 Coma scale, best motor response, obeys commands, unspecified time: Secondary | ICD-10-CM | POA: Diagnosis present

## 2016-12-31 DIAGNOSIS — R079 Chest pain, unspecified: Secondary | ICD-10-CM | POA: Diagnosis not present

## 2016-12-31 DIAGNOSIS — K567 Ileus, unspecified: Secondary | ICD-10-CM | POA: Diagnosis not present

## 2016-12-31 DIAGNOSIS — I87303 Chronic venous hypertension (idiopathic) without complications of bilateral lower extremity: Secondary | ICD-10-CM | POA: Diagnosis not present

## 2016-12-31 DIAGNOSIS — I499 Cardiac arrhythmia, unspecified: Secondary | ICD-10-CM | POA: Diagnosis not present

## 2016-12-31 DIAGNOSIS — R402140 Coma scale, eyes open, spontaneous, unspecified time: Secondary | ICD-10-CM | POA: Diagnosis present

## 2016-12-31 DIAGNOSIS — R296 Repeated falls: Secondary | ICD-10-CM | POA: Diagnosis not present

## 2016-12-31 DIAGNOSIS — S0510XA Contusion of eyeball and orbital tissues, unspecified eye, initial encounter: Secondary | ICD-10-CM | POA: Diagnosis not present

## 2016-12-31 DIAGNOSIS — S0542XA Penetrating wound of orbit with or without foreign body, left eye, initial encounter: Secondary | ICD-10-CM | POA: Diagnosis present

## 2016-12-31 DIAGNOSIS — I248 Other forms of acute ischemic heart disease: Secondary | ICD-10-CM | POA: Diagnosis not present

## 2016-12-31 DIAGNOSIS — R9431 Abnormal electrocardiogram [ECG] [EKG]: Secondary | ICD-10-CM | POA: Diagnosis not present

## 2016-12-31 DIAGNOSIS — I272 Pulmonary hypertension, unspecified: Secondary | ICD-10-CM | POA: Diagnosis present

## 2016-12-31 DIAGNOSIS — L97929 Non-pressure chronic ulcer of unspecified part of left lower leg with unspecified severity: Secondary | ICD-10-CM | POA: Diagnosis not present

## 2016-12-31 DIAGNOSIS — N183 Chronic kidney disease, stage 3 (moderate): Secondary | ICD-10-CM | POA: Diagnosis not present

## 2016-12-31 DIAGNOSIS — J90 Pleural effusion, not elsewhere classified: Secondary | ICD-10-CM | POA: Diagnosis not present

## 2016-12-31 DIAGNOSIS — R935 Abnormal findings on diagnostic imaging of other abdominal regions, including retroperitoneum: Secondary | ICD-10-CM | POA: Diagnosis not present

## 2016-12-31 DIAGNOSIS — N189 Chronic kidney disease, unspecified: Secondary | ICD-10-CM | POA: Diagnosis not present

## 2016-12-31 DIAGNOSIS — I959 Hypotension, unspecified: Secondary | ICD-10-CM | POA: Diagnosis not present

## 2016-12-31 DIAGNOSIS — Z87891 Personal history of nicotine dependence: Secondary | ICD-10-CM | POA: Diagnosis not present

## 2016-12-31 DIAGNOSIS — I10 Essential (primary) hypertension: Secondary | ICD-10-CM | POA: Insufficient documentation

## 2016-12-31 DIAGNOSIS — I87312 Chronic venous hypertension (idiopathic) with ulcer of left lower extremity: Secondary | ICD-10-CM | POA: Diagnosis present

## 2016-12-31 DIAGNOSIS — I5020 Unspecified systolic (congestive) heart failure: Secondary | ICD-10-CM | POA: Diagnosis not present

## 2016-12-31 DIAGNOSIS — Z431 Encounter for attention to gastrostomy: Secondary | ICD-10-CM | POA: Diagnosis not present

## 2016-12-31 DIAGNOSIS — J9811 Atelectasis: Secondary | ICD-10-CM | POA: Diagnosis not present

## 2016-12-31 DIAGNOSIS — R55 Syncope and collapse: Secondary | ICD-10-CM | POA: Diagnosis not present

## 2016-12-31 DIAGNOSIS — E1122 Type 2 diabetes mellitus with diabetic chronic kidney disease: Secondary | ICD-10-CM | POA: Diagnosis present

## 2016-12-31 DIAGNOSIS — R1110 Vomiting, unspecified: Secondary | ICD-10-CM | POA: Diagnosis not present

## 2016-12-31 DIAGNOSIS — R0609 Other forms of dyspnea: Secondary | ICD-10-CM | POA: Diagnosis not present

## 2016-12-31 DIAGNOSIS — R52 Pain, unspecified: Secondary | ICD-10-CM | POA: Diagnosis not present

## 2016-12-31 DIAGNOSIS — I469 Cardiac arrest, cause unspecified: Secondary | ICD-10-CM | POA: Diagnosis not present

## 2016-12-31 DIAGNOSIS — J189 Pneumonia, unspecified organism: Secondary | ICD-10-CM | POA: Diagnosis not present

## 2016-12-31 DIAGNOSIS — I872 Venous insufficiency (chronic) (peripheral): Secondary | ICD-10-CM | POA: Diagnosis not present

## 2016-12-31 DIAGNOSIS — R0789 Other chest pain: Secondary | ICD-10-CM | POA: Diagnosis not present

## 2016-12-31 DIAGNOSIS — J80 Acute respiratory distress syndrome: Secondary | ICD-10-CM | POA: Diagnosis not present

## 2016-12-31 DIAGNOSIS — Z978 Presence of other specified devices: Secondary | ICD-10-CM | POA: Diagnosis not present

## 2016-12-31 DIAGNOSIS — Z8674 Personal history of sudden cardiac arrest: Secondary | ICD-10-CM | POA: Diagnosis not present

## 2016-12-31 DIAGNOSIS — I509 Heart failure, unspecified: Secondary | ICD-10-CM | POA: Diagnosis not present

## 2016-12-31 DIAGNOSIS — I444 Left anterior fascicular block: Secondary | ICD-10-CM | POA: Diagnosis not present

## 2016-12-31 DIAGNOSIS — I361 Nonrheumatic tricuspid (valve) insufficiency: Secondary | ICD-10-CM | POA: Diagnosis not present

## 2016-12-31 DIAGNOSIS — R7989 Other specified abnormal findings of blood chemistry: Secondary | ICD-10-CM | POA: Diagnosis not present

## 2016-12-31 DIAGNOSIS — I452 Bifascicular block: Secondary | ICD-10-CM | POA: Diagnosis not present

## 2016-12-31 DIAGNOSIS — S2249XA Multiple fractures of ribs, unspecified side, initial encounter for closed fracture: Secondary | ICD-10-CM | POA: Diagnosis not present

## 2016-12-31 DIAGNOSIS — Z794 Long term (current) use of insulin: Secondary | ICD-10-CM | POA: Diagnosis not present

## 2016-12-31 DIAGNOSIS — I517 Cardiomegaly: Secondary | ICD-10-CM | POA: Diagnosis not present

## 2016-12-31 DIAGNOSIS — I110 Hypertensive heart disease with heart failure: Secondary | ICD-10-CM | POA: Diagnosis not present

## 2016-12-31 DIAGNOSIS — I468 Cardiac arrest due to other underlying condition: Secondary | ICD-10-CM | POA: Diagnosis not present

## 2016-12-31 DIAGNOSIS — R918 Other nonspecific abnormal finding of lung field: Secondary | ICD-10-CM | POA: Diagnosis not present

## 2016-12-31 DIAGNOSIS — W19XXXA Unspecified fall, initial encounter: Secondary | ICD-10-CM | POA: Diagnosis not present

## 2016-12-31 DIAGNOSIS — N179 Acute kidney failure, unspecified: Secondary | ICD-10-CM | POA: Diagnosis not present

## 2016-12-31 DIAGNOSIS — Z7901 Long term (current) use of anticoagulants: Secondary | ICD-10-CM | POA: Diagnosis not present

## 2016-12-31 DIAGNOSIS — S2239XA Fracture of one rib, unspecified side, initial encounter for closed fracture: Secondary | ICD-10-CM

## 2016-12-31 DIAGNOSIS — Z9889 Other specified postprocedural states: Secondary | ICD-10-CM | POA: Diagnosis not present

## 2016-12-31 DIAGNOSIS — J9601 Acute respiratory failure with hypoxia: Secondary | ICD-10-CM | POA: Diagnosis present

## 2016-12-31 DIAGNOSIS — I4892 Unspecified atrial flutter: Secondary | ICD-10-CM | POA: Diagnosis not present

## 2016-12-31 DIAGNOSIS — J15212 Pneumonia due to Methicillin resistant Staphylococcus aureus: Secondary | ICD-10-CM | POA: Diagnosis not present

## 2016-12-31 DIAGNOSIS — I25810 Atherosclerosis of coronary artery bypass graft(s) without angina pectoris: Secondary | ICD-10-CM | POA: Diagnosis not present

## 2016-12-31 DIAGNOSIS — I2510 Atherosclerotic heart disease of native coronary artery without angina pectoris: Secondary | ICD-10-CM | POA: Diagnosis not present

## 2016-12-31 DIAGNOSIS — I5032 Chronic diastolic (congestive) heart failure: Secondary | ICD-10-CM | POA: Diagnosis not present

## 2016-12-31 DIAGNOSIS — R1310 Dysphagia, unspecified: Secondary | ICD-10-CM | POA: Diagnosis not present

## 2016-12-31 DIAGNOSIS — J449 Chronic obstructive pulmonary disease, unspecified: Secondary | ICD-10-CM | POA: Diagnosis not present

## 2016-12-31 DIAGNOSIS — E119 Type 2 diabetes mellitus without complications: Secondary | ICD-10-CM | POA: Diagnosis not present

## 2016-12-31 DIAGNOSIS — I130 Hypertensive heart and chronic kidney disease with heart failure and stage 1 through stage 4 chronic kidney disease, or unspecified chronic kidney disease: Secondary | ICD-10-CM | POA: Diagnosis not present

## 2016-12-31 DIAGNOSIS — M8448XA Pathological fracture, other site, initial encounter for fracture: Secondary | ICD-10-CM | POA: Diagnosis not present

## 2016-12-31 DIAGNOSIS — I482 Chronic atrial fibrillation: Secondary | ICD-10-CM | POA: Diagnosis present

## 2016-12-31 DIAGNOSIS — I4891 Unspecified atrial fibrillation: Secondary | ICD-10-CM | POA: Diagnosis not present

## 2016-12-31 DIAGNOSIS — R0902 Hypoxemia: Secondary | ICD-10-CM | POA: Diagnosis not present

## 2016-12-31 DIAGNOSIS — Z430 Encounter for attention to tracheostomy: Secondary | ICD-10-CM | POA: Diagnosis not present

## 2016-12-31 DIAGNOSIS — E039 Hypothyroidism, unspecified: Secondary | ICD-10-CM | POA: Diagnosis not present

## 2016-12-31 HISTORY — DX: Fracture of one rib, unspecified side, initial encounter for closed fracture: S22.39XA

## 2016-12-31 HISTORY — DX: Hyperlipidemia, unspecified: E78.5

## 2017-01-16 DIAGNOSIS — Z7409 Other reduced mobility: Secondary | ICD-10-CM | POA: Diagnosis not present

## 2017-01-16 DIAGNOSIS — R404 Transient alteration of awareness: Secondary | ICD-10-CM | POA: Diagnosis not present

## 2017-01-16 DIAGNOSIS — L97819 Non-pressure chronic ulcer of other part of right lower leg with unspecified severity: Secondary | ICD-10-CM | POA: Diagnosis not present

## 2017-01-16 DIAGNOSIS — J9601 Acute respiratory failure with hypoxia: Secondary | ICD-10-CM | POA: Diagnosis not present

## 2017-01-16 DIAGNOSIS — I509 Heart failure, unspecified: Secondary | ICD-10-CM | POA: Diagnosis not present

## 2017-01-16 DIAGNOSIS — I872 Venous insufficiency (chronic) (peripheral): Secondary | ICD-10-CM | POA: Diagnosis not present

## 2017-01-16 DIAGNOSIS — N189 Chronic kidney disease, unspecified: Secondary | ICD-10-CM | POA: Diagnosis not present

## 2017-01-16 DIAGNOSIS — S2239XA Fracture of one rib, unspecified side, initial encounter for closed fracture: Secondary | ICD-10-CM | POA: Diagnosis not present

## 2017-01-16 DIAGNOSIS — I87303 Chronic venous hypertension (idiopathic) without complications of bilateral lower extremity: Secondary | ICD-10-CM | POA: Diagnosis not present

## 2017-01-16 DIAGNOSIS — J15212 Pneumonia due to Methicillin resistant Staphylococcus aureus: Secondary | ICD-10-CM | POA: Diagnosis not present

## 2017-01-16 DIAGNOSIS — J449 Chronic obstructive pulmonary disease, unspecified: Secondary | ICD-10-CM | POA: Diagnosis not present

## 2017-01-16 DIAGNOSIS — L97929 Non-pressure chronic ulcer of unspecified part of left lower leg with unspecified severity: Secondary | ICD-10-CM | POA: Diagnosis not present

## 2017-01-16 DIAGNOSIS — R55 Syncope and collapse: Secondary | ICD-10-CM | POA: Diagnosis not present

## 2017-01-16 DIAGNOSIS — I5032 Chronic diastolic (congestive) heart failure: Secondary | ICD-10-CM | POA: Diagnosis not present

## 2017-01-16 DIAGNOSIS — I2510 Atherosclerotic heart disease of native coronary artery without angina pectoris: Secondary | ICD-10-CM | POA: Diagnosis not present

## 2017-01-16 DIAGNOSIS — K567 Ileus, unspecified: Secondary | ICD-10-CM | POA: Diagnosis not present

## 2017-01-16 DIAGNOSIS — I468 Cardiac arrest due to other underlying condition: Secondary | ICD-10-CM | POA: Diagnosis not present

## 2017-01-16 DIAGNOSIS — R0602 Shortness of breath: Secondary | ICD-10-CM | POA: Diagnosis not present

## 2017-01-16 DIAGNOSIS — I469 Cardiac arrest, cause unspecified: Secondary | ICD-10-CM | POA: Diagnosis not present

## 2017-01-16 DIAGNOSIS — N183 Chronic kidney disease, stage 3 (moderate): Secondary | ICD-10-CM | POA: Diagnosis not present

## 2017-01-16 DIAGNOSIS — I4891 Unspecified atrial fibrillation: Secondary | ICD-10-CM | POA: Diagnosis not present

## 2017-02-01 DIAGNOSIS — Z7984 Long term (current) use of oral hypoglycemic drugs: Secondary | ICD-10-CM | POA: Diagnosis not present

## 2017-02-01 DIAGNOSIS — I480 Paroxysmal atrial fibrillation: Secondary | ICD-10-CM | POA: Diagnosis not present

## 2017-02-01 DIAGNOSIS — S2239XD Fracture of one rib, unspecified side, subsequent encounter for fracture with routine healing: Secondary | ICD-10-CM | POA: Diagnosis not present

## 2017-02-01 DIAGNOSIS — E1122 Type 2 diabetes mellitus with diabetic chronic kidney disease: Secondary | ICD-10-CM | POA: Diagnosis not present

## 2017-02-01 DIAGNOSIS — I130 Hypertensive heart and chronic kidney disease with heart failure and stage 1 through stage 4 chronic kidney disease, or unspecified chronic kidney disease: Secondary | ICD-10-CM | POA: Diagnosis not present

## 2017-02-01 DIAGNOSIS — I87312 Chronic venous hypertension (idiopathic) with ulcer of left lower extremity: Secondary | ICD-10-CM | POA: Diagnosis not present

## 2017-02-01 DIAGNOSIS — L97229 Non-pressure chronic ulcer of left calf with unspecified severity: Secondary | ICD-10-CM | POA: Diagnosis not present

## 2017-02-01 DIAGNOSIS — Z9181 History of falling: Secondary | ICD-10-CM | POA: Diagnosis not present

## 2017-02-01 DIAGNOSIS — Z8551 Personal history of malignant neoplasm of bladder: Secondary | ICD-10-CM | POA: Diagnosis not present

## 2017-02-01 DIAGNOSIS — N183 Chronic kidney disease, stage 3 (moderate): Secondary | ICD-10-CM | POA: Diagnosis not present

## 2017-02-01 DIAGNOSIS — I5033 Acute on chronic diastolic (congestive) heart failure: Secondary | ICD-10-CM | POA: Diagnosis not present

## 2017-02-01 DIAGNOSIS — Z7901 Long term (current) use of anticoagulants: Secondary | ICD-10-CM | POA: Diagnosis not present

## 2017-02-02 DIAGNOSIS — E1122 Type 2 diabetes mellitus with diabetic chronic kidney disease: Secondary | ICD-10-CM | POA: Diagnosis not present

## 2017-02-02 DIAGNOSIS — I130 Hypertensive heart and chronic kidney disease with heart failure and stage 1 through stage 4 chronic kidney disease, or unspecified chronic kidney disease: Secondary | ICD-10-CM | POA: Diagnosis not present

## 2017-02-02 DIAGNOSIS — N183 Chronic kidney disease, stage 3 (moderate): Secondary | ICD-10-CM | POA: Diagnosis not present

## 2017-02-02 DIAGNOSIS — I87312 Chronic venous hypertension (idiopathic) with ulcer of left lower extremity: Secondary | ICD-10-CM | POA: Diagnosis not present

## 2017-02-02 DIAGNOSIS — I5033 Acute on chronic diastolic (congestive) heart failure: Secondary | ICD-10-CM | POA: Diagnosis not present

## 2017-02-02 DIAGNOSIS — I480 Paroxysmal atrial fibrillation: Secondary | ICD-10-CM | POA: Diagnosis not present

## 2017-02-04 DIAGNOSIS — I130 Hypertensive heart and chronic kidney disease with heart failure and stage 1 through stage 4 chronic kidney disease, or unspecified chronic kidney disease: Secondary | ICD-10-CM | POA: Diagnosis not present

## 2017-02-04 DIAGNOSIS — I5033 Acute on chronic diastolic (congestive) heart failure: Secondary | ICD-10-CM | POA: Diagnosis not present

## 2017-02-04 DIAGNOSIS — I480 Paroxysmal atrial fibrillation: Secondary | ICD-10-CM | POA: Diagnosis not present

## 2017-02-04 DIAGNOSIS — I87312 Chronic venous hypertension (idiopathic) with ulcer of left lower extremity: Secondary | ICD-10-CM | POA: Diagnosis not present

## 2017-02-04 DIAGNOSIS — E1122 Type 2 diabetes mellitus with diabetic chronic kidney disease: Secondary | ICD-10-CM | POA: Diagnosis not present

## 2017-02-04 DIAGNOSIS — N183 Chronic kidney disease, stage 3 (moderate): Secondary | ICD-10-CM | POA: Diagnosis not present

## 2017-02-05 DIAGNOSIS — E1122 Type 2 diabetes mellitus with diabetic chronic kidney disease: Secondary | ICD-10-CM | POA: Diagnosis not present

## 2017-02-05 DIAGNOSIS — I5033 Acute on chronic diastolic (congestive) heart failure: Secondary | ICD-10-CM | POA: Diagnosis not present

## 2017-02-05 DIAGNOSIS — I130 Hypertensive heart and chronic kidney disease with heart failure and stage 1 through stage 4 chronic kidney disease, or unspecified chronic kidney disease: Secondary | ICD-10-CM | POA: Diagnosis not present

## 2017-02-05 DIAGNOSIS — I480 Paroxysmal atrial fibrillation: Secondary | ICD-10-CM | POA: Diagnosis not present

## 2017-02-05 DIAGNOSIS — N183 Chronic kidney disease, stage 3 (moderate): Secondary | ICD-10-CM | POA: Diagnosis not present

## 2017-02-05 DIAGNOSIS — I87312 Chronic venous hypertension (idiopathic) with ulcer of left lower extremity: Secondary | ICD-10-CM | POA: Diagnosis not present

## 2017-02-06 DIAGNOSIS — S22058A Other fracture of T5-T6 vertebra, initial encounter for closed fracture: Secondary | ICD-10-CM | POA: Diagnosis not present

## 2017-02-06 DIAGNOSIS — R0602 Shortness of breath: Secondary | ICD-10-CM | POA: Diagnosis not present

## 2017-02-06 DIAGNOSIS — M545 Low back pain: Secondary | ICD-10-CM | POA: Diagnosis not present

## 2017-02-06 DIAGNOSIS — I5020 Unspecified systolic (congestive) heart failure: Secondary | ICD-10-CM | POA: Diagnosis not present

## 2017-02-06 DIAGNOSIS — I130 Hypertensive heart and chronic kidney disease with heart failure and stage 1 through stage 4 chronic kidney disease, or unspecified chronic kidney disease: Secondary | ICD-10-CM | POA: Diagnosis not present

## 2017-02-06 DIAGNOSIS — J449 Chronic obstructive pulmonary disease, unspecified: Secondary | ICD-10-CM | POA: Diagnosis not present

## 2017-02-06 DIAGNOSIS — K5730 Diverticulosis of large intestine without perforation or abscess without bleeding: Secondary | ICD-10-CM | POA: Diagnosis not present

## 2017-02-06 DIAGNOSIS — J189 Pneumonia, unspecified organism: Secondary | ICD-10-CM | POA: Diagnosis not present

## 2017-02-06 DIAGNOSIS — S3992XA Unspecified injury of lower back, initial encounter: Secondary | ICD-10-CM | POA: Diagnosis not present

## 2017-02-06 DIAGNOSIS — M546 Pain in thoracic spine: Secondary | ICD-10-CM | POA: Diagnosis not present

## 2017-02-06 DIAGNOSIS — I4891 Unspecified atrial fibrillation: Secondary | ICD-10-CM | POA: Diagnosis not present

## 2017-02-06 DIAGNOSIS — S22050A Wedge compression fracture of T5-T6 vertebra, initial encounter for closed fracture: Secondary | ICD-10-CM | POA: Diagnosis not present

## 2017-02-06 DIAGNOSIS — Z9981 Dependence on supplemental oxygen: Secondary | ICD-10-CM | POA: Diagnosis not present

## 2017-02-06 DIAGNOSIS — I5022 Chronic systolic (congestive) heart failure: Secondary | ICD-10-CM | POA: Diagnosis not present

## 2017-02-06 DIAGNOSIS — J9610 Chronic respiratory failure, unspecified whether with hypoxia or hypercapnia: Secondary | ICD-10-CM | POA: Diagnosis not present

## 2017-02-06 DIAGNOSIS — Z7984 Long term (current) use of oral hypoglycemic drugs: Secondary | ICD-10-CM | POA: Diagnosis not present

## 2017-02-06 DIAGNOSIS — M8448XA Pathological fracture, other site, initial encounter for fracture: Secondary | ICD-10-CM | POA: Diagnosis not present

## 2017-02-06 DIAGNOSIS — J90 Pleural effusion, not elsewhere classified: Secondary | ICD-10-CM | POA: Diagnosis not present

## 2017-02-06 DIAGNOSIS — N200 Calculus of kidney: Secondary | ICD-10-CM | POA: Diagnosis not present

## 2017-02-06 DIAGNOSIS — Z87891 Personal history of nicotine dependence: Secondary | ICD-10-CM | POA: Diagnosis not present

## 2017-02-06 DIAGNOSIS — M549 Dorsalgia, unspecified: Secondary | ICD-10-CM | POA: Diagnosis not present

## 2017-02-06 DIAGNOSIS — S32010A Wedge compression fracture of first lumbar vertebra, initial encounter for closed fracture: Secondary | ICD-10-CM | POA: Diagnosis not present

## 2017-02-06 DIAGNOSIS — J439 Emphysema, unspecified: Secondary | ICD-10-CM | POA: Diagnosis not present

## 2017-02-06 DIAGNOSIS — M4845XA Fatigue fracture of vertebra, thoracolumbar region, initial encounter for fracture: Secondary | ICD-10-CM | POA: Diagnosis not present

## 2017-02-06 DIAGNOSIS — I25810 Atherosclerosis of coronary artery bypass graft(s) without angina pectoris: Secondary | ICD-10-CM | POA: Diagnosis not present

## 2017-02-06 DIAGNOSIS — N183 Chronic kidney disease, stage 3 (moderate): Secondary | ICD-10-CM | POA: Diagnosis not present

## 2017-02-06 DIAGNOSIS — J9621 Acute and chronic respiratory failure with hypoxia: Secondary | ICD-10-CM | POA: Diagnosis not present

## 2017-02-06 DIAGNOSIS — E1122 Type 2 diabetes mellitus with diabetic chronic kidney disease: Secondary | ICD-10-CM | POA: Diagnosis not present

## 2017-02-07 DIAGNOSIS — I4510 Unspecified right bundle-branch block: Secondary | ICD-10-CM | POA: Diagnosis not present

## 2017-02-07 DIAGNOSIS — J189 Pneumonia, unspecified organism: Secondary | ICD-10-CM | POA: Diagnosis not present

## 2017-02-07 DIAGNOSIS — M8448XA Pathological fracture, other site, initial encounter for fracture: Secondary | ICD-10-CM | POA: Diagnosis not present

## 2017-02-07 DIAGNOSIS — Z87891 Personal history of nicotine dependence: Secondary | ICD-10-CM | POA: Diagnosis not present

## 2017-02-07 DIAGNOSIS — Z886 Allergy status to analgesic agent status: Secondary | ICD-10-CM | POA: Diagnosis not present

## 2017-02-07 DIAGNOSIS — Z880 Allergy status to penicillin: Secondary | ICD-10-CM | POA: Diagnosis not present

## 2017-02-07 DIAGNOSIS — N183 Chronic kidney disease, stage 3 (moderate): Secondary | ICD-10-CM | POA: Diagnosis present

## 2017-02-07 DIAGNOSIS — J9610 Chronic respiratory failure, unspecified whether with hypoxia or hypercapnia: Secondary | ICD-10-CM | POA: Diagnosis present

## 2017-02-07 DIAGNOSIS — M4854XA Collapsed vertebra, not elsewhere classified, thoracic region, initial encounter for fracture: Secondary | ICD-10-CM | POA: Diagnosis not present

## 2017-02-07 DIAGNOSIS — I2510 Atherosclerotic heart disease of native coronary artery without angina pectoris: Secondary | ICD-10-CM | POA: Diagnosis not present

## 2017-02-07 DIAGNOSIS — S32018A Other fracture of first lumbar vertebra, initial encounter for closed fracture: Secondary | ICD-10-CM | POA: Diagnosis present

## 2017-02-07 DIAGNOSIS — M4856XA Collapsed vertebra, not elsewhere classified, lumbar region, initial encounter for fracture: Secondary | ICD-10-CM | POA: Diagnosis not present

## 2017-02-07 DIAGNOSIS — Z8674 Personal history of sudden cardiac arrest: Secondary | ICD-10-CM | POA: Diagnosis not present

## 2017-02-07 DIAGNOSIS — I482 Chronic atrial fibrillation: Secondary | ICD-10-CM | POA: Diagnosis present

## 2017-02-07 DIAGNOSIS — E669 Obesity, unspecified: Secondary | ICD-10-CM | POA: Diagnosis present

## 2017-02-07 DIAGNOSIS — Z7901 Long term (current) use of anticoagulants: Secondary | ICD-10-CM | POA: Diagnosis not present

## 2017-02-07 DIAGNOSIS — I452 Bifascicular block: Secondary | ICD-10-CM | POA: Diagnosis not present

## 2017-02-07 DIAGNOSIS — I5020 Unspecified systolic (congestive) heart failure: Secondary | ICD-10-CM | POA: Diagnosis not present

## 2017-02-07 DIAGNOSIS — I2109 ST elevation (STEMI) myocardial infarction involving other coronary artery of anterior wall: Secondary | ICD-10-CM | POA: Diagnosis not present

## 2017-02-07 DIAGNOSIS — E785 Hyperlipidemia, unspecified: Secondary | ICD-10-CM | POA: Diagnosis present

## 2017-02-07 DIAGNOSIS — N400 Enlarged prostate without lower urinary tract symptoms: Secondary | ICD-10-CM | POA: Diagnosis not present

## 2017-02-07 DIAGNOSIS — I872 Venous insufficiency (chronic) (peripheral): Secondary | ICD-10-CM | POA: Diagnosis present

## 2017-02-07 DIAGNOSIS — E1122 Type 2 diabetes mellitus with diabetic chronic kidney disease: Secondary | ICD-10-CM | POA: Diagnosis present

## 2017-02-07 DIAGNOSIS — R0602 Shortness of breath: Secondary | ICD-10-CM | POA: Diagnosis not present

## 2017-02-07 DIAGNOSIS — I444 Left anterior fascicular block: Secondary | ICD-10-CM | POA: Diagnosis not present

## 2017-02-07 DIAGNOSIS — I130 Hypertensive heart and chronic kidney disease with heart failure and stage 1 through stage 4 chronic kidney disease, or unspecified chronic kidney disease: Secondary | ICD-10-CM | POA: Diagnosis present

## 2017-02-07 DIAGNOSIS — S22000A Wedge compression fracture of unspecified thoracic vertebra, initial encounter for closed fracture: Secondary | ICD-10-CM | POA: Diagnosis not present

## 2017-02-07 DIAGNOSIS — I4891 Unspecified atrial fibrillation: Secondary | ICD-10-CM | POA: Diagnosis not present

## 2017-02-07 DIAGNOSIS — Z8701 Personal history of pneumonia (recurrent): Secondary | ICD-10-CM | POA: Diagnosis not present

## 2017-02-07 DIAGNOSIS — E119 Type 2 diabetes mellitus without complications: Secondary | ICD-10-CM | POA: Diagnosis not present

## 2017-02-07 DIAGNOSIS — Z951 Presence of aortocoronary bypass graft: Secondary | ICD-10-CM | POA: Diagnosis not present

## 2017-02-07 DIAGNOSIS — I5022 Chronic systolic (congestive) heart failure: Secondary | ICD-10-CM | POA: Diagnosis present

## 2017-02-07 DIAGNOSIS — I110 Hypertensive heart disease with heart failure: Secondary | ICD-10-CM | POA: Diagnosis not present

## 2017-02-07 DIAGNOSIS — Z6831 Body mass index (BMI) 31.0-31.9, adult: Secondary | ICD-10-CM | POA: Diagnosis not present

## 2017-02-07 DIAGNOSIS — I2119 ST elevation (STEMI) myocardial infarction involving other coronary artery of inferior wall: Secondary | ICD-10-CM | POA: Diagnosis not present

## 2017-02-07 DIAGNOSIS — J9621 Acute and chronic respiratory failure with hypoxia: Secondary | ICD-10-CM | POA: Diagnosis not present

## 2017-02-07 DIAGNOSIS — Z7984 Long term (current) use of oral hypoglycemic drugs: Secondary | ICD-10-CM | POA: Diagnosis not present

## 2017-02-07 DIAGNOSIS — Z9981 Dependence on supplemental oxygen: Secondary | ICD-10-CM | POA: Diagnosis not present

## 2017-02-07 DIAGNOSIS — S22058A Other fracture of T5-T6 vertebra, initial encounter for closed fracture: Secondary | ICD-10-CM | POA: Diagnosis present

## 2017-02-07 DIAGNOSIS — J449 Chronic obstructive pulmonary disease, unspecified: Secondary | ICD-10-CM | POA: Diagnosis not present

## 2017-02-10 DIAGNOSIS — J189 Pneumonia, unspecified organism: Secondary | ICD-10-CM | POA: Diagnosis not present

## 2017-02-10 DIAGNOSIS — Z6831 Body mass index (BMI) 31.0-31.9, adult: Secondary | ICD-10-CM | POA: Diagnosis not present

## 2017-02-10 DIAGNOSIS — I509 Heart failure, unspecified: Secondary | ICD-10-CM | POA: Diagnosis not present

## 2017-02-10 DIAGNOSIS — Z09 Encounter for follow-up examination after completed treatment for conditions other than malignant neoplasm: Secondary | ICD-10-CM | POA: Diagnosis not present

## 2017-02-10 DIAGNOSIS — M4850XA Collapsed vertebra, not elsewhere classified, site unspecified, initial encounter for fracture: Secondary | ICD-10-CM | POA: Diagnosis not present

## 2017-02-10 DIAGNOSIS — J449 Chronic obstructive pulmonary disease, unspecified: Secondary | ICD-10-CM | POA: Diagnosis not present

## 2017-02-10 DIAGNOSIS — D692 Other nonthrombocytopenic purpura: Secondary | ICD-10-CM | POA: Diagnosis not present

## 2017-02-10 DIAGNOSIS — Z79899 Other long term (current) drug therapy: Secondary | ICD-10-CM | POA: Diagnosis not present

## 2017-02-11 DIAGNOSIS — I87312 Chronic venous hypertension (idiopathic) with ulcer of left lower extremity: Secondary | ICD-10-CM | POA: Diagnosis not present

## 2017-02-11 DIAGNOSIS — N183 Chronic kidney disease, stage 3 (moderate): Secondary | ICD-10-CM | POA: Diagnosis not present

## 2017-02-11 DIAGNOSIS — E1122 Type 2 diabetes mellitus with diabetic chronic kidney disease: Secondary | ICD-10-CM | POA: Diagnosis not present

## 2017-02-11 DIAGNOSIS — I5033 Acute on chronic diastolic (congestive) heart failure: Secondary | ICD-10-CM | POA: Diagnosis not present

## 2017-02-11 DIAGNOSIS — I130 Hypertensive heart and chronic kidney disease with heart failure and stage 1 through stage 4 chronic kidney disease, or unspecified chronic kidney disease: Secondary | ICD-10-CM | POA: Diagnosis not present

## 2017-02-11 DIAGNOSIS — I480 Paroxysmal atrial fibrillation: Secondary | ICD-10-CM | POA: Diagnosis not present

## 2017-02-12 DIAGNOSIS — E1122 Type 2 diabetes mellitus with diabetic chronic kidney disease: Secondary | ICD-10-CM | POA: Diagnosis not present

## 2017-02-12 DIAGNOSIS — N183 Chronic kidney disease, stage 3 (moderate): Secondary | ICD-10-CM | POA: Diagnosis not present

## 2017-02-12 DIAGNOSIS — I5033 Acute on chronic diastolic (congestive) heart failure: Secondary | ICD-10-CM | POA: Diagnosis not present

## 2017-02-12 DIAGNOSIS — I480 Paroxysmal atrial fibrillation: Secondary | ICD-10-CM | POA: Diagnosis not present

## 2017-02-12 DIAGNOSIS — I87312 Chronic venous hypertension (idiopathic) with ulcer of left lower extremity: Secondary | ICD-10-CM | POA: Diagnosis not present

## 2017-02-12 DIAGNOSIS — I130 Hypertensive heart and chronic kidney disease with heart failure and stage 1 through stage 4 chronic kidney disease, or unspecified chronic kidney disease: Secondary | ICD-10-CM | POA: Diagnosis not present

## 2017-02-13 DIAGNOSIS — I5033 Acute on chronic diastolic (congestive) heart failure: Secondary | ICD-10-CM | POA: Diagnosis not present

## 2017-02-13 DIAGNOSIS — N183 Chronic kidney disease, stage 3 (moderate): Secondary | ICD-10-CM | POA: Diagnosis not present

## 2017-02-13 DIAGNOSIS — I87312 Chronic venous hypertension (idiopathic) with ulcer of left lower extremity: Secondary | ICD-10-CM | POA: Diagnosis not present

## 2017-02-13 DIAGNOSIS — I130 Hypertensive heart and chronic kidney disease with heart failure and stage 1 through stage 4 chronic kidney disease, or unspecified chronic kidney disease: Secondary | ICD-10-CM | POA: Diagnosis not present

## 2017-02-13 DIAGNOSIS — E1122 Type 2 diabetes mellitus with diabetic chronic kidney disease: Secondary | ICD-10-CM | POA: Diagnosis not present

## 2017-02-13 DIAGNOSIS — I480 Paroxysmal atrial fibrillation: Secondary | ICD-10-CM | POA: Diagnosis not present

## 2017-02-17 DIAGNOSIS — I5033 Acute on chronic diastolic (congestive) heart failure: Secondary | ICD-10-CM | POA: Diagnosis not present

## 2017-02-17 DIAGNOSIS — N183 Chronic kidney disease, stage 3 (moderate): Secondary | ICD-10-CM | POA: Diagnosis not present

## 2017-02-17 DIAGNOSIS — I130 Hypertensive heart and chronic kidney disease with heart failure and stage 1 through stage 4 chronic kidney disease, or unspecified chronic kidney disease: Secondary | ICD-10-CM | POA: Diagnosis not present

## 2017-02-17 DIAGNOSIS — I87312 Chronic venous hypertension (idiopathic) with ulcer of left lower extremity: Secondary | ICD-10-CM | POA: Diagnosis not present

## 2017-02-17 DIAGNOSIS — E1122 Type 2 diabetes mellitus with diabetic chronic kidney disease: Secondary | ICD-10-CM | POA: Diagnosis not present

## 2017-02-17 DIAGNOSIS — I480 Paroxysmal atrial fibrillation: Secondary | ICD-10-CM | POA: Diagnosis not present

## 2017-02-19 DIAGNOSIS — I480 Paroxysmal atrial fibrillation: Secondary | ICD-10-CM | POA: Diagnosis not present

## 2017-02-19 DIAGNOSIS — I130 Hypertensive heart and chronic kidney disease with heart failure and stage 1 through stage 4 chronic kidney disease, or unspecified chronic kidney disease: Secondary | ICD-10-CM | POA: Diagnosis not present

## 2017-02-19 DIAGNOSIS — N183 Chronic kidney disease, stage 3 (moderate): Secondary | ICD-10-CM | POA: Diagnosis not present

## 2017-02-19 DIAGNOSIS — I87312 Chronic venous hypertension (idiopathic) with ulcer of left lower extremity: Secondary | ICD-10-CM | POA: Diagnosis not present

## 2017-02-19 DIAGNOSIS — I5033 Acute on chronic diastolic (congestive) heart failure: Secondary | ICD-10-CM | POA: Diagnosis not present

## 2017-02-19 DIAGNOSIS — E1122 Type 2 diabetes mellitus with diabetic chronic kidney disease: Secondary | ICD-10-CM | POA: Diagnosis not present

## 2017-02-20 DIAGNOSIS — I130 Hypertensive heart and chronic kidney disease with heart failure and stage 1 through stage 4 chronic kidney disease, or unspecified chronic kidney disease: Secondary | ICD-10-CM | POA: Diagnosis not present

## 2017-02-20 DIAGNOSIS — I87312 Chronic venous hypertension (idiopathic) with ulcer of left lower extremity: Secondary | ICD-10-CM | POA: Diagnosis not present

## 2017-02-20 DIAGNOSIS — N183 Chronic kidney disease, stage 3 (moderate): Secondary | ICD-10-CM | POA: Diagnosis not present

## 2017-02-20 DIAGNOSIS — E1122 Type 2 diabetes mellitus with diabetic chronic kidney disease: Secondary | ICD-10-CM | POA: Diagnosis not present

## 2017-02-20 DIAGNOSIS — I5033 Acute on chronic diastolic (congestive) heart failure: Secondary | ICD-10-CM | POA: Diagnosis not present

## 2017-02-20 DIAGNOSIS — I480 Paroxysmal atrial fibrillation: Secondary | ICD-10-CM | POA: Diagnosis not present

## 2017-02-24 DIAGNOSIS — I480 Paroxysmal atrial fibrillation: Secondary | ICD-10-CM | POA: Diagnosis not present

## 2017-02-24 DIAGNOSIS — I130 Hypertensive heart and chronic kidney disease with heart failure and stage 1 through stage 4 chronic kidney disease, or unspecified chronic kidney disease: Secondary | ICD-10-CM | POA: Diagnosis not present

## 2017-02-24 DIAGNOSIS — I87312 Chronic venous hypertension (idiopathic) with ulcer of left lower extremity: Secondary | ICD-10-CM | POA: Diagnosis not present

## 2017-02-24 DIAGNOSIS — N183 Chronic kidney disease, stage 3 (moderate): Secondary | ICD-10-CM | POA: Diagnosis not present

## 2017-02-24 DIAGNOSIS — I5033 Acute on chronic diastolic (congestive) heart failure: Secondary | ICD-10-CM | POA: Diagnosis not present

## 2017-02-24 DIAGNOSIS — E1122 Type 2 diabetes mellitus with diabetic chronic kidney disease: Secondary | ICD-10-CM | POA: Diagnosis not present

## 2017-02-25 DIAGNOSIS — R0609 Other forms of dyspnea: Secondary | ICD-10-CM | POA: Diagnosis not present

## 2017-02-25 DIAGNOSIS — I5033 Acute on chronic diastolic (congestive) heart failure: Secondary | ICD-10-CM | POA: Diagnosis not present

## 2017-02-25 DIAGNOSIS — N183 Chronic kidney disease, stage 3 (moderate): Secondary | ICD-10-CM | POA: Diagnosis not present

## 2017-02-25 DIAGNOSIS — I87312 Chronic venous hypertension (idiopathic) with ulcer of left lower extremity: Secondary | ICD-10-CM | POA: Diagnosis not present

## 2017-02-25 DIAGNOSIS — I480 Paroxysmal atrial fibrillation: Secondary | ICD-10-CM | POA: Diagnosis not present

## 2017-02-25 DIAGNOSIS — I2510 Atherosclerotic heart disease of native coronary artery without angina pectoris: Secondary | ICD-10-CM | POA: Diagnosis not present

## 2017-02-25 DIAGNOSIS — I130 Hypertensive heart and chronic kidney disease with heart failure and stage 1 through stage 4 chronic kidney disease, or unspecified chronic kidney disease: Secondary | ICD-10-CM | POA: Diagnosis not present

## 2017-02-25 DIAGNOSIS — E1122 Type 2 diabetes mellitus with diabetic chronic kidney disease: Secondary | ICD-10-CM | POA: Diagnosis not present

## 2017-02-26 DIAGNOSIS — I480 Paroxysmal atrial fibrillation: Secondary | ICD-10-CM | POA: Diagnosis not present

## 2017-02-26 DIAGNOSIS — I87312 Chronic venous hypertension (idiopathic) with ulcer of left lower extremity: Secondary | ICD-10-CM | POA: Diagnosis not present

## 2017-02-26 DIAGNOSIS — I130 Hypertensive heart and chronic kidney disease with heart failure and stage 1 through stage 4 chronic kidney disease, or unspecified chronic kidney disease: Secondary | ICD-10-CM | POA: Diagnosis not present

## 2017-02-26 DIAGNOSIS — N183 Chronic kidney disease, stage 3 (moderate): Secondary | ICD-10-CM | POA: Diagnosis not present

## 2017-02-26 DIAGNOSIS — E1122 Type 2 diabetes mellitus with diabetic chronic kidney disease: Secondary | ICD-10-CM | POA: Diagnosis not present

## 2017-02-26 DIAGNOSIS — I5033 Acute on chronic diastolic (congestive) heart failure: Secondary | ICD-10-CM | POA: Diagnosis not present

## 2017-02-27 DIAGNOSIS — I480 Paroxysmal atrial fibrillation: Secondary | ICD-10-CM | POA: Diagnosis not present

## 2017-02-27 DIAGNOSIS — N183 Chronic kidney disease, stage 3 (moderate): Secondary | ICD-10-CM | POA: Diagnosis not present

## 2017-02-27 DIAGNOSIS — I130 Hypertensive heart and chronic kidney disease with heart failure and stage 1 through stage 4 chronic kidney disease, or unspecified chronic kidney disease: Secondary | ICD-10-CM | POA: Diagnosis not present

## 2017-02-27 DIAGNOSIS — E1122 Type 2 diabetes mellitus with diabetic chronic kidney disease: Secondary | ICD-10-CM | POA: Diagnosis not present

## 2017-02-27 DIAGNOSIS — I5033 Acute on chronic diastolic (congestive) heart failure: Secondary | ICD-10-CM | POA: Diagnosis not present

## 2017-02-27 DIAGNOSIS — I87312 Chronic venous hypertension (idiopathic) with ulcer of left lower extremity: Secondary | ICD-10-CM | POA: Diagnosis not present

## 2017-03-03 DIAGNOSIS — I130 Hypertensive heart and chronic kidney disease with heart failure and stage 1 through stage 4 chronic kidney disease, or unspecified chronic kidney disease: Secondary | ICD-10-CM | POA: Diagnosis not present

## 2017-03-03 DIAGNOSIS — N183 Chronic kidney disease, stage 3 (moderate): Secondary | ICD-10-CM | POA: Diagnosis not present

## 2017-03-03 DIAGNOSIS — I87312 Chronic venous hypertension (idiopathic) with ulcer of left lower extremity: Secondary | ICD-10-CM | POA: Diagnosis not present

## 2017-03-03 DIAGNOSIS — E1122 Type 2 diabetes mellitus with diabetic chronic kidney disease: Secondary | ICD-10-CM | POA: Diagnosis not present

## 2017-03-03 DIAGNOSIS — I5033 Acute on chronic diastolic (congestive) heart failure: Secondary | ICD-10-CM | POA: Diagnosis not present

## 2017-03-03 DIAGNOSIS — I480 Paroxysmal atrial fibrillation: Secondary | ICD-10-CM | POA: Diagnosis not present

## 2017-03-04 DIAGNOSIS — J42 Unspecified chronic bronchitis: Secondary | ICD-10-CM | POA: Diagnosis not present

## 2017-03-04 DIAGNOSIS — I2510 Atherosclerotic heart disease of native coronary artery without angina pectoris: Secondary | ICD-10-CM | POA: Diagnosis not present

## 2017-03-04 DIAGNOSIS — R0609 Other forms of dyspnea: Secondary | ICD-10-CM | POA: Diagnosis not present

## 2017-03-04 DIAGNOSIS — Z79899 Other long term (current) drug therapy: Secondary | ICD-10-CM | POA: Diagnosis not present

## 2017-03-04 DIAGNOSIS — I480 Paroxysmal atrial fibrillation: Secondary | ICD-10-CM | POA: Diagnosis not present

## 2017-03-05 DIAGNOSIS — N183 Chronic kidney disease, stage 3 (moderate): Secondary | ICD-10-CM | POA: Diagnosis not present

## 2017-03-05 DIAGNOSIS — E1122 Type 2 diabetes mellitus with diabetic chronic kidney disease: Secondary | ICD-10-CM | POA: Diagnosis not present

## 2017-03-05 DIAGNOSIS — I87312 Chronic venous hypertension (idiopathic) with ulcer of left lower extremity: Secondary | ICD-10-CM | POA: Diagnosis not present

## 2017-03-05 DIAGNOSIS — I130 Hypertensive heart and chronic kidney disease with heart failure and stage 1 through stage 4 chronic kidney disease, or unspecified chronic kidney disease: Secondary | ICD-10-CM | POA: Diagnosis not present

## 2017-03-05 DIAGNOSIS — I480 Paroxysmal atrial fibrillation: Secondary | ICD-10-CM | POA: Diagnosis not present

## 2017-03-05 DIAGNOSIS — I5033 Acute on chronic diastolic (congestive) heart failure: Secondary | ICD-10-CM | POA: Diagnosis not present

## 2017-03-09 DIAGNOSIS — I87312 Chronic venous hypertension (idiopathic) with ulcer of left lower extremity: Secondary | ICD-10-CM | POA: Diagnosis not present

## 2017-03-09 DIAGNOSIS — E1122 Type 2 diabetes mellitus with diabetic chronic kidney disease: Secondary | ICD-10-CM | POA: Diagnosis not present

## 2017-03-09 DIAGNOSIS — I480 Paroxysmal atrial fibrillation: Secondary | ICD-10-CM | POA: Diagnosis not present

## 2017-03-09 DIAGNOSIS — N183 Chronic kidney disease, stage 3 (moderate): Secondary | ICD-10-CM | POA: Diagnosis not present

## 2017-03-09 DIAGNOSIS — I130 Hypertensive heart and chronic kidney disease with heart failure and stage 1 through stage 4 chronic kidney disease, or unspecified chronic kidney disease: Secondary | ICD-10-CM | POA: Diagnosis not present

## 2017-03-09 DIAGNOSIS — I5033 Acute on chronic diastolic (congestive) heart failure: Secondary | ICD-10-CM | POA: Diagnosis not present

## 2017-03-10 DIAGNOSIS — I87312 Chronic venous hypertension (idiopathic) with ulcer of left lower extremity: Secondary | ICD-10-CM | POA: Diagnosis not present

## 2017-03-10 DIAGNOSIS — S22050D Wedge compression fracture of T5-T6 vertebra, subsequent encounter for fracture with routine healing: Secondary | ICD-10-CM | POA: Diagnosis not present

## 2017-03-10 DIAGNOSIS — S32010A Wedge compression fracture of first lumbar vertebra, initial encounter for closed fracture: Secondary | ICD-10-CM | POA: Diagnosis not present

## 2017-03-10 DIAGNOSIS — M8580 Other specified disorders of bone density and structure, unspecified site: Secondary | ICD-10-CM | POA: Diagnosis not present

## 2017-03-10 DIAGNOSIS — M4304 Spondylolysis, thoracic region: Secondary | ICD-10-CM | POA: Diagnosis not present

## 2017-03-10 DIAGNOSIS — N183 Chronic kidney disease, stage 3 (moderate): Secondary | ICD-10-CM | POA: Diagnosis not present

## 2017-03-10 DIAGNOSIS — Z96641 Presence of right artificial hip joint: Secondary | ICD-10-CM | POA: Diagnosis not present

## 2017-03-10 DIAGNOSIS — M8588 Other specified disorders of bone density and structure, other site: Secondary | ICD-10-CM | POA: Diagnosis not present

## 2017-03-10 DIAGNOSIS — I480 Paroxysmal atrial fibrillation: Secondary | ICD-10-CM | POA: Diagnosis not present

## 2017-03-10 DIAGNOSIS — S22000D Wedge compression fracture of unspecified thoracic vertebra, subsequent encounter for fracture with routine healing: Secondary | ICD-10-CM | POA: Diagnosis not present

## 2017-03-10 DIAGNOSIS — M47897 Other spondylosis, lumbosacral region: Secondary | ICD-10-CM | POA: Diagnosis not present

## 2017-03-10 DIAGNOSIS — M4186 Other forms of scoliosis, lumbar region: Secondary | ICD-10-CM | POA: Diagnosis not present

## 2017-03-10 DIAGNOSIS — I130 Hypertensive heart and chronic kidney disease with heart failure and stage 1 through stage 4 chronic kidney disease, or unspecified chronic kidney disease: Secondary | ICD-10-CM | POA: Diagnosis not present

## 2017-03-10 DIAGNOSIS — S32010D Wedge compression fracture of first lumbar vertebra, subsequent encounter for fracture with routine healing: Secondary | ICD-10-CM | POA: Diagnosis not present

## 2017-03-10 DIAGNOSIS — E1122 Type 2 diabetes mellitus with diabetic chronic kidney disease: Secondary | ICD-10-CM | POA: Diagnosis not present

## 2017-03-10 DIAGNOSIS — S22000A Wedge compression fracture of unspecified thoracic vertebra, initial encounter for closed fracture: Secondary | ICD-10-CM | POA: Insufficient documentation

## 2017-03-10 DIAGNOSIS — I5033 Acute on chronic diastolic (congestive) heart failure: Secondary | ICD-10-CM | POA: Diagnosis not present

## 2017-03-10 DIAGNOSIS — M47896 Other spondylosis, lumbar region: Secondary | ICD-10-CM | POA: Diagnosis not present

## 2017-03-10 DIAGNOSIS — M5136 Other intervertebral disc degeneration, lumbar region: Secondary | ICD-10-CM | POA: Diagnosis not present

## 2017-03-10 DIAGNOSIS — Z951 Presence of aortocoronary bypass graft: Secondary | ICD-10-CM | POA: Diagnosis not present

## 2017-03-10 HISTORY — DX: Wedge compression fracture of unspecified thoracic vertebra, initial encounter for closed fracture: S22.000A

## 2017-03-12 DIAGNOSIS — I5033 Acute on chronic diastolic (congestive) heart failure: Secondary | ICD-10-CM | POA: Diagnosis not present

## 2017-03-12 DIAGNOSIS — I87312 Chronic venous hypertension (idiopathic) with ulcer of left lower extremity: Secondary | ICD-10-CM | POA: Diagnosis not present

## 2017-03-12 DIAGNOSIS — I130 Hypertensive heart and chronic kidney disease with heart failure and stage 1 through stage 4 chronic kidney disease, or unspecified chronic kidney disease: Secondary | ICD-10-CM | POA: Diagnosis not present

## 2017-03-12 DIAGNOSIS — E1122 Type 2 diabetes mellitus with diabetic chronic kidney disease: Secondary | ICD-10-CM | POA: Diagnosis not present

## 2017-03-12 DIAGNOSIS — N183 Chronic kidney disease, stage 3 (moderate): Secondary | ICD-10-CM | POA: Diagnosis not present

## 2017-03-12 DIAGNOSIS — I480 Paroxysmal atrial fibrillation: Secondary | ICD-10-CM | POA: Diagnosis not present

## 2017-03-17 DIAGNOSIS — I480 Paroxysmal atrial fibrillation: Secondary | ICD-10-CM | POA: Diagnosis not present

## 2017-03-17 DIAGNOSIS — I130 Hypertensive heart and chronic kidney disease with heart failure and stage 1 through stage 4 chronic kidney disease, or unspecified chronic kidney disease: Secondary | ICD-10-CM | POA: Diagnosis not present

## 2017-03-17 DIAGNOSIS — E1122 Type 2 diabetes mellitus with diabetic chronic kidney disease: Secondary | ICD-10-CM | POA: Diagnosis not present

## 2017-03-17 DIAGNOSIS — N183 Chronic kidney disease, stage 3 (moderate): Secondary | ICD-10-CM | POA: Diagnosis not present

## 2017-03-17 DIAGNOSIS — I87312 Chronic venous hypertension (idiopathic) with ulcer of left lower extremity: Secondary | ICD-10-CM | POA: Diagnosis not present

## 2017-03-17 DIAGNOSIS — I5033 Acute on chronic diastolic (congestive) heart failure: Secondary | ICD-10-CM | POA: Diagnosis not present

## 2017-03-25 DIAGNOSIS — I5033 Acute on chronic diastolic (congestive) heart failure: Secondary | ICD-10-CM | POA: Diagnosis not present

## 2017-03-25 DIAGNOSIS — I87312 Chronic venous hypertension (idiopathic) with ulcer of left lower extremity: Secondary | ICD-10-CM | POA: Diagnosis not present

## 2017-03-25 DIAGNOSIS — E1122 Type 2 diabetes mellitus with diabetic chronic kidney disease: Secondary | ICD-10-CM | POA: Diagnosis not present

## 2017-03-25 DIAGNOSIS — I480 Paroxysmal atrial fibrillation: Secondary | ICD-10-CM | POA: Diagnosis not present

## 2017-03-25 DIAGNOSIS — N183 Chronic kidney disease, stage 3 (moderate): Secondary | ICD-10-CM | POA: Diagnosis not present

## 2017-03-25 DIAGNOSIS — I130 Hypertensive heart and chronic kidney disease with heart failure and stage 1 through stage 4 chronic kidney disease, or unspecified chronic kidney disease: Secondary | ICD-10-CM | POA: Diagnosis not present

## 2017-03-31 DIAGNOSIS — E1122 Type 2 diabetes mellitus with diabetic chronic kidney disease: Secondary | ICD-10-CM | POA: Diagnosis not present

## 2017-03-31 DIAGNOSIS — I480 Paroxysmal atrial fibrillation: Secondary | ICD-10-CM | POA: Diagnosis not present

## 2017-03-31 DIAGNOSIS — I130 Hypertensive heart and chronic kidney disease with heart failure and stage 1 through stage 4 chronic kidney disease, or unspecified chronic kidney disease: Secondary | ICD-10-CM | POA: Diagnosis not present

## 2017-03-31 DIAGNOSIS — I87312 Chronic venous hypertension (idiopathic) with ulcer of left lower extremity: Secondary | ICD-10-CM | POA: Diagnosis not present

## 2017-03-31 DIAGNOSIS — I5033 Acute on chronic diastolic (congestive) heart failure: Secondary | ICD-10-CM | POA: Diagnosis not present

## 2017-03-31 DIAGNOSIS — N183 Chronic kidney disease, stage 3 (moderate): Secondary | ICD-10-CM | POA: Diagnosis not present

## 2017-04-10 DIAGNOSIS — C672 Malignant neoplasm of lateral wall of bladder: Secondary | ICD-10-CM | POA: Diagnosis not present

## 2017-04-10 DIAGNOSIS — N3281 Overactive bladder: Secondary | ICD-10-CM | POA: Diagnosis not present

## 2017-04-10 DIAGNOSIS — N401 Enlarged prostate with lower urinary tract symptoms: Secondary | ICD-10-CM | POA: Diagnosis not present

## 2017-04-10 DIAGNOSIS — N289 Disorder of kidney and ureter, unspecified: Secondary | ICD-10-CM | POA: Diagnosis not present

## 2017-04-14 DIAGNOSIS — I2510 Atherosclerotic heart disease of native coronary artery without angina pectoris: Secondary | ICD-10-CM | POA: Diagnosis not present

## 2017-04-14 DIAGNOSIS — I481 Persistent atrial fibrillation: Secondary | ICD-10-CM | POA: Diagnosis not present

## 2017-04-14 DIAGNOSIS — R0609 Other forms of dyspnea: Secondary | ICD-10-CM | POA: Diagnosis not present

## 2017-05-12 DIAGNOSIS — M4304 Spondylolysis, thoracic region: Secondary | ICD-10-CM | POA: Diagnosis not present

## 2017-05-12 DIAGNOSIS — S32010D Wedge compression fracture of first lumbar vertebra, subsequent encounter for fracture with routine healing: Secondary | ICD-10-CM | POA: Diagnosis not present

## 2017-05-12 DIAGNOSIS — M8588 Other specified disorders of bone density and structure, other site: Secondary | ICD-10-CM | POA: Diagnosis not present

## 2017-05-12 DIAGNOSIS — M5386 Other specified dorsopathies, lumbar region: Secondary | ICD-10-CM | POA: Diagnosis not present

## 2017-05-12 DIAGNOSIS — S22000D Wedge compression fracture of unspecified thoracic vertebra, subsequent encounter for fracture with routine healing: Secondary | ICD-10-CM | POA: Diagnosis not present

## 2017-05-12 DIAGNOSIS — M8580 Other specified disorders of bone density and structure, unspecified site: Secondary | ICD-10-CM | POA: Diagnosis not present

## 2017-05-12 DIAGNOSIS — S32019D Unspecified fracture of first lumbar vertebra, subsequent encounter for fracture with routine healing: Secondary | ICD-10-CM | POA: Diagnosis not present

## 2017-05-12 DIAGNOSIS — Z96641 Presence of right artificial hip joint: Secondary | ICD-10-CM | POA: Diagnosis not present

## 2017-05-12 DIAGNOSIS — M5136 Other intervertebral disc degeneration, lumbar region: Secondary | ICD-10-CM | POA: Diagnosis not present

## 2017-05-12 DIAGNOSIS — M47814 Spondylosis without myelopathy or radiculopathy, thoracic region: Secondary | ICD-10-CM | POA: Diagnosis not present

## 2017-05-12 DIAGNOSIS — S22059D Unspecified fracture of T5-T6 vertebra, subsequent encounter for fracture with routine healing: Secondary | ICD-10-CM | POA: Diagnosis not present

## 2017-05-12 DIAGNOSIS — Z951 Presence of aortocoronary bypass graft: Secondary | ICD-10-CM | POA: Diagnosis not present

## 2017-05-12 DIAGNOSIS — S22050D Wedge compression fracture of T5-T6 vertebra, subsequent encounter for fracture with routine healing: Secondary | ICD-10-CM | POA: Diagnosis not present

## 2017-05-12 DIAGNOSIS — M47816 Spondylosis without myelopathy or radiculopathy, lumbar region: Secondary | ICD-10-CM | POA: Diagnosis not present

## 2017-05-12 DIAGNOSIS — I700 Atherosclerosis of aorta: Secondary | ICD-10-CM | POA: Diagnosis not present

## 2017-05-18 DIAGNOSIS — C672 Malignant neoplasm of lateral wall of bladder: Secondary | ICD-10-CM | POA: Diagnosis not present

## 2017-05-19 DIAGNOSIS — I2510 Atherosclerotic heart disease of native coronary artery without angina pectoris: Secondary | ICD-10-CM | POA: Diagnosis not present

## 2017-05-19 DIAGNOSIS — I481 Persistent atrial fibrillation: Secondary | ICD-10-CM | POA: Diagnosis not present

## 2017-05-19 DIAGNOSIS — E119 Type 2 diabetes mellitus without complications: Secondary | ICD-10-CM | POA: Diagnosis not present

## 2017-05-19 DIAGNOSIS — N184 Chronic kidney disease, stage 4 (severe): Secondary | ICD-10-CM | POA: Diagnosis not present

## 2017-05-19 DIAGNOSIS — I452 Bifascicular block: Secondary | ICD-10-CM | POA: Insufficient documentation

## 2017-05-25 DIAGNOSIS — J449 Chronic obstructive pulmonary disease, unspecified: Secondary | ICD-10-CM | POA: Diagnosis not present

## 2017-05-25 DIAGNOSIS — Z79899 Other long term (current) drug therapy: Secondary | ICD-10-CM | POA: Diagnosis not present

## 2017-05-25 DIAGNOSIS — I509 Heart failure, unspecified: Secondary | ICD-10-CM | POA: Diagnosis not present

## 2017-05-25 DIAGNOSIS — I10 Essential (primary) hypertension: Secondary | ICD-10-CM | POA: Diagnosis not present

## 2017-05-25 DIAGNOSIS — N184 Chronic kidney disease, stage 4 (severe): Secondary | ICD-10-CM | POA: Diagnosis not present

## 2017-05-25 DIAGNOSIS — Z6830 Body mass index (BMI) 30.0-30.9, adult: Secondary | ICD-10-CM | POA: Diagnosis not present

## 2017-05-25 DIAGNOSIS — I739 Peripheral vascular disease, unspecified: Secondary | ICD-10-CM | POA: Diagnosis not present

## 2017-05-25 DIAGNOSIS — D692 Other nonthrombocytopenic purpura: Secondary | ICD-10-CM | POA: Diagnosis not present

## 2017-05-25 DIAGNOSIS — I2510 Atherosclerotic heart disease of native coronary artery without angina pectoris: Secondary | ICD-10-CM | POA: Diagnosis not present

## 2017-05-25 DIAGNOSIS — E1151 Type 2 diabetes mellitus with diabetic peripheral angiopathy without gangrene: Secondary | ICD-10-CM | POA: Diagnosis not present

## 2017-06-19 DIAGNOSIS — C672 Malignant neoplasm of lateral wall of bladder: Secondary | ICD-10-CM | POA: Diagnosis not present

## 2017-06-19 DIAGNOSIS — N401 Enlarged prostate with lower urinary tract symptoms: Secondary | ICD-10-CM | POA: Diagnosis not present

## 2017-06-23 DIAGNOSIS — R310 Gross hematuria: Secondary | ICD-10-CM | POA: Diagnosis not present

## 2017-06-23 DIAGNOSIS — Z79899 Other long term (current) drug therapy: Secondary | ICD-10-CM | POA: Diagnosis not present

## 2017-06-23 DIAGNOSIS — Z7982 Long term (current) use of aspirin: Secondary | ICD-10-CM | POA: Diagnosis not present

## 2017-06-23 DIAGNOSIS — I739 Peripheral vascular disease, unspecified: Secondary | ICD-10-CM | POA: Diagnosis not present

## 2017-06-23 DIAGNOSIS — I519 Heart disease, unspecified: Secondary | ICD-10-CM | POA: Diagnosis not present

## 2017-06-23 DIAGNOSIS — D494 Neoplasm of unspecified behavior of bladder: Secondary | ICD-10-CM | POA: Diagnosis not present

## 2017-06-23 DIAGNOSIS — I4891 Unspecified atrial fibrillation: Secondary | ICD-10-CM | POA: Diagnosis not present

## 2017-06-23 DIAGNOSIS — J45909 Unspecified asthma, uncomplicated: Secondary | ICD-10-CM | POA: Diagnosis not present

## 2017-06-23 DIAGNOSIS — Z951 Presence of aortocoronary bypass graft: Secondary | ICD-10-CM | POA: Diagnosis not present

## 2017-06-23 DIAGNOSIS — J449 Chronic obstructive pulmonary disease, unspecified: Secondary | ICD-10-CM | POA: Diagnosis not present

## 2017-06-23 DIAGNOSIS — E119 Type 2 diabetes mellitus without complications: Secondary | ICD-10-CM | POA: Diagnosis not present

## 2017-06-23 DIAGNOSIS — K219 Gastro-esophageal reflux disease without esophagitis: Secondary | ICD-10-CM | POA: Diagnosis not present

## 2017-06-23 DIAGNOSIS — I10 Essential (primary) hypertension: Secondary | ICD-10-CM | POA: Diagnosis not present

## 2017-06-23 DIAGNOSIS — C678 Malignant neoplasm of overlapping sites of bladder: Secondary | ICD-10-CM | POA: Diagnosis not present

## 2017-06-23 DIAGNOSIS — N401 Enlarged prostate with lower urinary tract symptoms: Secondary | ICD-10-CM | POA: Diagnosis not present

## 2017-06-23 DIAGNOSIS — I252 Old myocardial infarction: Secondary | ICD-10-CM | POA: Diagnosis not present

## 2017-06-23 DIAGNOSIS — C672 Malignant neoplasm of lateral wall of bladder: Secondary | ICD-10-CM | POA: Diagnosis not present

## 2017-06-24 DIAGNOSIS — C672 Malignant neoplasm of lateral wall of bladder: Secondary | ICD-10-CM | POA: Diagnosis not present

## 2017-07-02 DIAGNOSIS — N289 Disorder of kidney and ureter, unspecified: Secondary | ICD-10-CM | POA: Diagnosis not present

## 2017-07-02 DIAGNOSIS — C672 Malignant neoplasm of lateral wall of bladder: Secondary | ICD-10-CM | POA: Diagnosis not present

## 2017-07-16 DIAGNOSIS — C672 Malignant neoplasm of lateral wall of bladder: Secondary | ICD-10-CM | POA: Diagnosis not present

## 2017-07-22 DIAGNOSIS — N401 Enlarged prostate with lower urinary tract symptoms: Secondary | ICD-10-CM | POA: Diagnosis not present

## 2017-07-22 DIAGNOSIS — C672 Malignant neoplasm of lateral wall of bladder: Secondary | ICD-10-CM | POA: Diagnosis not present

## 2017-07-29 DIAGNOSIS — C672 Malignant neoplasm of lateral wall of bladder: Secondary | ICD-10-CM | POA: Diagnosis not present

## 2017-07-29 DIAGNOSIS — N401 Enlarged prostate with lower urinary tract symptoms: Secondary | ICD-10-CM | POA: Diagnosis not present

## 2017-08-05 DIAGNOSIS — N401 Enlarged prostate with lower urinary tract symptoms: Secondary | ICD-10-CM | POA: Diagnosis not present

## 2017-08-05 DIAGNOSIS — C672 Malignant neoplasm of lateral wall of bladder: Secondary | ICD-10-CM | POA: Diagnosis not present

## 2017-08-12 DIAGNOSIS — C672 Malignant neoplasm of lateral wall of bladder: Secondary | ICD-10-CM | POA: Diagnosis not present

## 2017-08-12 DIAGNOSIS — N401 Enlarged prostate with lower urinary tract symptoms: Secondary | ICD-10-CM | POA: Diagnosis not present

## 2017-08-19 DIAGNOSIS — C672 Malignant neoplasm of lateral wall of bladder: Secondary | ICD-10-CM | POA: Diagnosis not present

## 2017-08-26 DIAGNOSIS — N390 Urinary tract infection, site not specified: Secondary | ICD-10-CM | POA: Diagnosis not present

## 2017-08-26 DIAGNOSIS — C672 Malignant neoplasm of lateral wall of bladder: Secondary | ICD-10-CM | POA: Diagnosis not present

## 2017-08-28 DIAGNOSIS — Z23 Encounter for immunization: Secondary | ICD-10-CM | POA: Diagnosis not present

## 2017-09-02 DIAGNOSIS — N390 Urinary tract infection, site not specified: Secondary | ICD-10-CM | POA: Diagnosis not present

## 2017-09-15 DIAGNOSIS — N390 Urinary tract infection, site not specified: Secondary | ICD-10-CM | POA: Diagnosis not present

## 2017-09-16 DIAGNOSIS — H401113 Primary open-angle glaucoma, right eye, severe stage: Secondary | ICD-10-CM | POA: Diagnosis not present

## 2017-09-16 DIAGNOSIS — E119 Type 2 diabetes mellitus without complications: Secondary | ICD-10-CM | POA: Diagnosis not present

## 2017-09-16 DIAGNOSIS — H401121 Primary open-angle glaucoma, left eye, mild stage: Secondary | ICD-10-CM | POA: Diagnosis not present

## 2017-09-16 DIAGNOSIS — H2512 Age-related nuclear cataract, left eye: Secondary | ICD-10-CM | POA: Diagnosis not present

## 2017-09-23 DIAGNOSIS — C672 Malignant neoplasm of lateral wall of bladder: Secondary | ICD-10-CM | POA: Diagnosis not present

## 2017-10-08 DIAGNOSIS — H401121 Primary open-angle glaucoma, left eye, mild stage: Secondary | ICD-10-CM | POA: Diagnosis not present

## 2017-10-08 DIAGNOSIS — H401113 Primary open-angle glaucoma, right eye, severe stage: Secondary | ICD-10-CM | POA: Diagnosis not present

## 2017-10-08 DIAGNOSIS — N390 Urinary tract infection, site not specified: Secondary | ICD-10-CM | POA: Diagnosis not present

## 2017-10-08 DIAGNOSIS — E119 Type 2 diabetes mellitus without complications: Secondary | ICD-10-CM | POA: Diagnosis not present

## 2017-10-10 DIAGNOSIS — K219 Gastro-esophageal reflux disease without esophagitis: Secondary | ICD-10-CM | POA: Diagnosis present

## 2017-10-10 DIAGNOSIS — Z743 Need for continuous supervision: Secondary | ICD-10-CM | POA: Diagnosis not present

## 2017-10-10 DIAGNOSIS — Z7902 Long term (current) use of antithrombotics/antiplatelets: Secondary | ICD-10-CM | POA: Diagnosis not present

## 2017-10-10 DIAGNOSIS — C775 Secondary and unspecified malignant neoplasm of intrapelvic lymph nodes: Secondary | ICD-10-CM | POA: Diagnosis not present

## 2017-10-10 DIAGNOSIS — I4891 Unspecified atrial fibrillation: Secondary | ICD-10-CM | POA: Diagnosis not present

## 2017-10-10 DIAGNOSIS — I25810 Atherosclerosis of coronary artery bypass graft(s) without angina pectoris: Secondary | ICD-10-CM | POA: Diagnosis not present

## 2017-10-10 DIAGNOSIS — Z880 Allergy status to penicillin: Secondary | ICD-10-CM | POA: Diagnosis not present

## 2017-10-10 DIAGNOSIS — C672 Malignant neoplasm of lateral wall of bladder: Secondary | ICD-10-CM | POA: Diagnosis not present

## 2017-10-10 DIAGNOSIS — I129 Hypertensive chronic kidney disease with stage 1 through stage 4 chronic kidney disease, or unspecified chronic kidney disease: Secondary | ICD-10-CM | POA: Diagnosis present

## 2017-10-10 DIAGNOSIS — S32001D Stable burst fracture of unspecified lumbar vertebra, subsequent encounter for fracture with routine healing: Secondary | ICD-10-CM | POA: Diagnosis not present

## 2017-10-10 DIAGNOSIS — R109 Unspecified abdominal pain: Secondary | ICD-10-CM | POA: Diagnosis not present

## 2017-10-10 DIAGNOSIS — Z9981 Dependence on supplemental oxygen: Secondary | ICD-10-CM | POA: Diagnosis not present

## 2017-10-10 DIAGNOSIS — Z87891 Personal history of nicotine dependence: Secondary | ICD-10-CM | POA: Diagnosis not present

## 2017-10-10 DIAGNOSIS — E1122 Type 2 diabetes mellitus with diabetic chronic kidney disease: Secondary | ICD-10-CM | POA: Diagnosis present

## 2017-10-10 DIAGNOSIS — Z885 Allergy status to narcotic agent status: Secondary | ICD-10-CM | POA: Diagnosis not present

## 2017-10-10 DIAGNOSIS — N429 Disorder of prostate, unspecified: Secondary | ICD-10-CM | POA: Diagnosis not present

## 2017-10-10 DIAGNOSIS — Z7982 Long term (current) use of aspirin: Secondary | ICD-10-CM | POA: Diagnosis not present

## 2017-10-10 DIAGNOSIS — R279 Unspecified lack of coordination: Secondary | ICD-10-CM | POA: Diagnosis not present

## 2017-10-10 DIAGNOSIS — C801 Malignant (primary) neoplasm, unspecified: Secondary | ICD-10-CM | POA: Diagnosis not present

## 2017-10-10 DIAGNOSIS — Z794 Long term (current) use of insulin: Secondary | ICD-10-CM | POA: Diagnosis not present

## 2017-10-10 DIAGNOSIS — E119 Type 2 diabetes mellitus without complications: Secondary | ICD-10-CM | POA: Diagnosis not present

## 2017-10-10 DIAGNOSIS — R590 Localized enlarged lymph nodes: Secondary | ICD-10-CM | POA: Diagnosis not present

## 2017-10-10 DIAGNOSIS — S22051A Stable burst fracture of T5-T6 vertebra, initial encounter for closed fracture: Secondary | ICD-10-CM | POA: Diagnosis not present

## 2017-10-10 DIAGNOSIS — E039 Hypothyroidism, unspecified: Secondary | ICD-10-CM | POA: Diagnosis not present

## 2017-10-10 DIAGNOSIS — J449 Chronic obstructive pulmonary disease, unspecified: Secondary | ICD-10-CM | POA: Diagnosis not present

## 2017-10-10 DIAGNOSIS — Z955 Presence of coronary angioplasty implant and graft: Secondary | ICD-10-CM | POA: Diagnosis not present

## 2017-10-10 DIAGNOSIS — S32011A Stable burst fracture of first lumbar vertebra, initial encounter for closed fracture: Secondary | ICD-10-CM | POA: Diagnosis not present

## 2017-10-10 DIAGNOSIS — N183 Chronic kidney disease, stage 3 (moderate): Secondary | ICD-10-CM | POA: Diagnosis not present

## 2017-10-10 DIAGNOSIS — M79652 Pain in left thigh: Secondary | ICD-10-CM | POA: Diagnosis not present

## 2017-10-10 DIAGNOSIS — M545 Low back pain: Secondary | ICD-10-CM | POA: Diagnosis not present

## 2017-10-10 DIAGNOSIS — Z79899 Other long term (current) drug therapy: Secondary | ICD-10-CM | POA: Diagnosis not present

## 2017-10-10 DIAGNOSIS — M25552 Pain in left hip: Secondary | ICD-10-CM | POA: Diagnosis not present

## 2017-10-10 DIAGNOSIS — S32001A Stable burst fracture of unspecified lumbar vertebra, initial encounter for closed fracture: Secondary | ICD-10-CM | POA: Diagnosis not present

## 2017-10-10 DIAGNOSIS — Z888 Allergy status to other drugs, medicaments and biological substances status: Secondary | ICD-10-CM | POA: Diagnosis not present

## 2017-10-10 DIAGNOSIS — Z951 Presence of aortocoronary bypass graft: Secondary | ICD-10-CM | POA: Diagnosis not present

## 2017-10-10 DIAGNOSIS — N390 Urinary tract infection, site not specified: Secondary | ICD-10-CM | POA: Diagnosis not present

## 2017-10-10 DIAGNOSIS — C679 Malignant neoplasm of bladder, unspecified: Secondary | ICD-10-CM | POA: Diagnosis present

## 2017-10-10 DIAGNOSIS — M5136 Other intervertebral disc degeneration, lumbar region: Secondary | ICD-10-CM | POA: Diagnosis not present

## 2017-10-13 DIAGNOSIS — S32001A Stable burst fracture of unspecified lumbar vertebra, initial encounter for closed fracture: Secondary | ICD-10-CM | POA: Diagnosis not present

## 2017-10-13 DIAGNOSIS — E119 Type 2 diabetes mellitus without complications: Secondary | ICD-10-CM | POA: Diagnosis not present

## 2017-10-13 DIAGNOSIS — I25810 Atherosclerosis of coronary artery bypass graft(s) without angina pectoris: Secondary | ICD-10-CM | POA: Diagnosis not present

## 2017-10-13 DIAGNOSIS — C801 Malignant (primary) neoplasm, unspecified: Secondary | ICD-10-CM | POA: Diagnosis not present

## 2017-10-13 DIAGNOSIS — C775 Secondary and unspecified malignant neoplasm of intrapelvic lymph nodes: Secondary | ICD-10-CM | POA: Diagnosis not present

## 2017-10-13 DIAGNOSIS — R279 Unspecified lack of coordination: Secondary | ICD-10-CM | POA: Diagnosis not present

## 2017-10-13 DIAGNOSIS — Z743 Need for continuous supervision: Secondary | ICD-10-CM | POA: Diagnosis not present

## 2017-10-13 DIAGNOSIS — M549 Dorsalgia, unspecified: Secondary | ICD-10-CM | POA: Diagnosis not present

## 2017-10-13 DIAGNOSIS — N429 Disorder of prostate, unspecified: Secondary | ICD-10-CM | POA: Diagnosis not present

## 2017-10-13 DIAGNOSIS — M47897 Other spondylosis, lumbosacral region: Secondary | ICD-10-CM | POA: Diagnosis not present

## 2017-10-13 DIAGNOSIS — S32402D Unspecified fracture of left acetabulum, subsequent encounter for fracture with routine healing: Secondary | ICD-10-CM | POA: Diagnosis not present

## 2017-10-13 DIAGNOSIS — M47817 Spondylosis without myelopathy or radiculopathy, lumbosacral region: Secondary | ICD-10-CM | POA: Diagnosis not present

## 2017-10-13 DIAGNOSIS — M545 Low back pain: Secondary | ICD-10-CM | POA: Diagnosis not present

## 2017-10-13 DIAGNOSIS — M2550 Pain in unspecified joint: Secondary | ICD-10-CM | POA: Diagnosis not present

## 2017-10-13 DIAGNOSIS — M5416 Radiculopathy, lumbar region: Secondary | ICD-10-CM | POA: Diagnosis not present

## 2017-10-13 DIAGNOSIS — E039 Hypothyroidism, unspecified: Secondary | ICD-10-CM | POA: Diagnosis not present

## 2017-10-13 DIAGNOSIS — R590 Localized enlarged lymph nodes: Secondary | ICD-10-CM | POA: Diagnosis not present

## 2017-10-13 DIAGNOSIS — S32001D Stable burst fracture of unspecified lumbar vertebra, subsequent encounter for fracture with routine healing: Secondary | ICD-10-CM | POA: Diagnosis not present

## 2017-10-13 DIAGNOSIS — S32011A Stable burst fracture of first lumbar vertebra, initial encounter for closed fracture: Secondary | ICD-10-CM | POA: Diagnosis not present

## 2017-10-13 DIAGNOSIS — M810 Age-related osteoporosis without current pathological fracture: Secondary | ICD-10-CM | POA: Diagnosis not present

## 2017-10-13 DIAGNOSIS — S22051A Stable burst fracture of T5-T6 vertebra, initial encounter for closed fracture: Secondary | ICD-10-CM | POA: Diagnosis not present

## 2017-10-13 DIAGNOSIS — R262 Difficulty in walking, not elsewhere classified: Secondary | ICD-10-CM | POA: Diagnosis not present

## 2017-10-13 DIAGNOSIS — Z794 Long term (current) use of insulin: Secondary | ICD-10-CM | POA: Diagnosis not present

## 2017-10-13 DIAGNOSIS — N390 Urinary tract infection, site not specified: Secondary | ICD-10-CM | POA: Diagnosis not present

## 2017-10-13 DIAGNOSIS — I509 Heart failure, unspecified: Secondary | ICD-10-CM | POA: Diagnosis not present

## 2017-10-13 DIAGNOSIS — N183 Chronic kidney disease, stage 3 (moderate): Secondary | ICD-10-CM | POA: Diagnosis not present

## 2017-10-13 DIAGNOSIS — I4891 Unspecified atrial fibrillation: Secondary | ICD-10-CM | POA: Diagnosis not present

## 2017-10-13 DIAGNOSIS — J449 Chronic obstructive pulmonary disease, unspecified: Secondary | ICD-10-CM | POA: Diagnosis not present

## 2017-10-16 DIAGNOSIS — M5416 Radiculopathy, lumbar region: Secondary | ICD-10-CM | POA: Diagnosis not present

## 2017-10-16 DIAGNOSIS — M47897 Other spondylosis, lumbosacral region: Secondary | ICD-10-CM | POA: Diagnosis not present

## 2017-10-16 DIAGNOSIS — M47817 Spondylosis without myelopathy or radiculopathy, lumbosacral region: Secondary | ICD-10-CM | POA: Diagnosis not present

## 2017-10-17 DIAGNOSIS — I4891 Unspecified atrial fibrillation: Secondary | ICD-10-CM | POA: Diagnosis not present

## 2017-10-17 DIAGNOSIS — N183 Chronic kidney disease, stage 3 (moderate): Secondary | ICD-10-CM | POA: Diagnosis not present

## 2017-10-17 DIAGNOSIS — I509 Heart failure, unspecified: Secondary | ICD-10-CM | POA: Diagnosis not present

## 2017-10-17 DIAGNOSIS — M549 Dorsalgia, unspecified: Secondary | ICD-10-CM | POA: Diagnosis not present

## 2017-10-17 DIAGNOSIS — M2550 Pain in unspecified joint: Secondary | ICD-10-CM | POA: Diagnosis not present

## 2017-10-17 DIAGNOSIS — M810 Age-related osteoporosis without current pathological fracture: Secondary | ICD-10-CM | POA: Diagnosis not present

## 2017-10-17 DIAGNOSIS — S32402D Unspecified fracture of left acetabulum, subsequent encounter for fracture with routine healing: Secondary | ICD-10-CM | POA: Diagnosis not present

## 2017-10-17 DIAGNOSIS — R262 Difficulty in walking, not elsewhere classified: Secondary | ICD-10-CM | POA: Diagnosis not present

## 2017-10-27 DIAGNOSIS — J439 Emphysema, unspecified: Secondary | ICD-10-CM | POA: Diagnosis not present

## 2017-10-27 DIAGNOSIS — J449 Chronic obstructive pulmonary disease, unspecified: Secondary | ICD-10-CM | POA: Diagnosis not present

## 2017-10-29 DIAGNOSIS — Z125 Encounter for screening for malignant neoplasm of prostate: Secondary | ICD-10-CM | POA: Diagnosis not present

## 2017-10-29 DIAGNOSIS — C672 Malignant neoplasm of lateral wall of bladder: Secondary | ICD-10-CM | POA: Diagnosis not present

## 2017-10-29 DIAGNOSIS — N401 Enlarged prostate with lower urinary tract symptoms: Secondary | ICD-10-CM | POA: Diagnosis not present

## 2017-11-04 DIAGNOSIS — Z6828 Body mass index (BMI) 28.0-28.9, adult: Secondary | ICD-10-CM | POA: Diagnosis not present

## 2017-11-04 DIAGNOSIS — C679 Malignant neoplasm of bladder, unspecified: Secondary | ICD-10-CM | POA: Diagnosis not present

## 2017-11-04 DIAGNOSIS — S32001A Stable burst fracture of unspecified lumbar vertebra, initial encounter for closed fracture: Secondary | ICD-10-CM | POA: Diagnosis not present

## 2017-11-04 DIAGNOSIS — E663 Overweight: Secondary | ICD-10-CM | POA: Diagnosis not present

## 2017-11-04 DIAGNOSIS — Z09 Encounter for follow-up examination after completed treatment for conditions other than malignant neoplasm: Secondary | ICD-10-CM | POA: Diagnosis not present

## 2017-11-05 DIAGNOSIS — C775 Secondary and unspecified malignant neoplasm of intrapelvic lymph nodes: Secondary | ICD-10-CM | POA: Diagnosis not present

## 2017-11-05 DIAGNOSIS — K219 Gastro-esophageal reflux disease without esophagitis: Secondary | ICD-10-CM | POA: Diagnosis not present

## 2017-11-05 DIAGNOSIS — Z951 Presence of aortocoronary bypass graft: Secondary | ICD-10-CM | POA: Diagnosis not present

## 2017-11-05 DIAGNOSIS — N400 Enlarged prostate without lower urinary tract symptoms: Secondary | ICD-10-CM | POA: Diagnosis not present

## 2017-11-05 DIAGNOSIS — Z7982 Long term (current) use of aspirin: Secondary | ICD-10-CM | POA: Diagnosis not present

## 2017-11-05 DIAGNOSIS — Z923 Personal history of irradiation: Secondary | ICD-10-CM | POA: Diagnosis not present

## 2017-11-05 DIAGNOSIS — Z9221 Personal history of antineoplastic chemotherapy: Secondary | ICD-10-CM | POA: Diagnosis not present

## 2017-11-05 DIAGNOSIS — J449 Chronic obstructive pulmonary disease, unspecified: Secondary | ICD-10-CM | POA: Diagnosis not present

## 2017-11-05 DIAGNOSIS — E039 Hypothyroidism, unspecified: Secondary | ICD-10-CM | POA: Diagnosis not present

## 2017-11-05 DIAGNOSIS — Z79899 Other long term (current) drug therapy: Secondary | ICD-10-CM | POA: Diagnosis not present

## 2017-11-05 DIAGNOSIS — C675 Malignant neoplasm of bladder neck: Secondary | ICD-10-CM | POA: Diagnosis not present

## 2017-11-05 DIAGNOSIS — I4891 Unspecified atrial fibrillation: Secondary | ICD-10-CM | POA: Diagnosis not present

## 2017-11-05 DIAGNOSIS — M25552 Pain in left hip: Secondary | ICD-10-CM | POA: Diagnosis not present

## 2017-11-05 DIAGNOSIS — Z87891 Personal history of nicotine dependence: Secondary | ICD-10-CM | POA: Diagnosis not present

## 2017-11-06 DIAGNOSIS — M25552 Pain in left hip: Secondary | ICD-10-CM | POA: Diagnosis not present

## 2017-11-06 DIAGNOSIS — R600 Localized edema: Secondary | ICD-10-CM | POA: Diagnosis not present

## 2017-11-06 DIAGNOSIS — C675 Malignant neoplasm of bladder neck: Secondary | ICD-10-CM | POA: Diagnosis not present

## 2017-11-10 DIAGNOSIS — C672 Malignant neoplasm of lateral wall of bladder: Secondary | ICD-10-CM | POA: Diagnosis not present

## 2017-11-11 DIAGNOSIS — Z452 Encounter for adjustment and management of vascular access device: Secondary | ICD-10-CM | POA: Diagnosis not present

## 2017-11-11 DIAGNOSIS — I2510 Atherosclerotic heart disease of native coronary artery without angina pectoris: Secondary | ICD-10-CM | POA: Diagnosis not present

## 2017-11-11 DIAGNOSIS — J449 Chronic obstructive pulmonary disease, unspecified: Secondary | ICD-10-CM | POA: Diagnosis not present

## 2017-11-11 DIAGNOSIS — C7911 Secondary malignant neoplasm of bladder: Secondary | ICD-10-CM | POA: Diagnosis not present

## 2017-11-11 DIAGNOSIS — C672 Malignant neoplasm of lateral wall of bladder: Secondary | ICD-10-CM | POA: Diagnosis not present

## 2017-11-12 DIAGNOSIS — C675 Malignant neoplasm of bladder neck: Secondary | ICD-10-CM | POA: Diagnosis not present

## 2017-11-13 DIAGNOSIS — C675 Malignant neoplasm of bladder neck: Secondary | ICD-10-CM | POA: Diagnosis not present

## 2017-11-14 DIAGNOSIS — Z951 Presence of aortocoronary bypass graft: Secondary | ICD-10-CM | POA: Diagnosis not present

## 2017-11-14 DIAGNOSIS — N400 Enlarged prostate without lower urinary tract symptoms: Secondary | ICD-10-CM | POA: Diagnosis present

## 2017-11-14 DIAGNOSIS — M25562 Pain in left knee: Secondary | ICD-10-CM | POA: Diagnosis not present

## 2017-11-14 DIAGNOSIS — S8991XA Unspecified injury of right lower leg, initial encounter: Secondary | ICD-10-CM | POA: Diagnosis not present

## 2017-11-14 DIAGNOSIS — I129 Hypertensive chronic kidney disease with stage 1 through stage 4 chronic kidney disease, or unspecified chronic kidney disease: Secondary | ICD-10-CM | POA: Diagnosis present

## 2017-11-14 DIAGNOSIS — I482 Chronic atrial fibrillation: Secondary | ICD-10-CM | POA: Diagnosis present

## 2017-11-14 DIAGNOSIS — J189 Pneumonia, unspecified organism: Secondary | ICD-10-CM | POA: Diagnosis not present

## 2017-11-14 DIAGNOSIS — S8992XA Unspecified injury of left lower leg, initial encounter: Secondary | ICD-10-CM | POA: Diagnosis not present

## 2017-11-14 DIAGNOSIS — J441 Chronic obstructive pulmonary disease with (acute) exacerbation: Secondary | ICD-10-CM | POA: Diagnosis present

## 2017-11-14 DIAGNOSIS — I4891 Unspecified atrial fibrillation: Secondary | ICD-10-CM | POA: Diagnosis not present

## 2017-11-14 DIAGNOSIS — R6510 Systemic inflammatory response syndrome (SIRS) of non-infectious origin without acute organ dysfunction: Secondary | ICD-10-CM | POA: Diagnosis not present

## 2017-11-14 DIAGNOSIS — S0990XA Unspecified injury of head, initial encounter: Secondary | ICD-10-CM | POA: Diagnosis not present

## 2017-11-14 DIAGNOSIS — E782 Mixed hyperlipidemia: Secondary | ICD-10-CM | POA: Diagnosis present

## 2017-11-14 DIAGNOSIS — S59911A Unspecified injury of right forearm, initial encounter: Secondary | ICD-10-CM | POA: Diagnosis not present

## 2017-11-14 DIAGNOSIS — M79662 Pain in left lower leg: Secondary | ICD-10-CM | POA: Diagnosis not present

## 2017-11-14 DIAGNOSIS — M79631 Pain in right forearm: Secondary | ICD-10-CM | POA: Diagnosis not present

## 2017-11-14 DIAGNOSIS — N183 Chronic kidney disease, stage 3 (moderate): Secondary | ICD-10-CM | POA: Diagnosis present

## 2017-11-14 DIAGNOSIS — K219 Gastro-esophageal reflux disease without esophagitis: Secondary | ICD-10-CM | POA: Diagnosis present

## 2017-11-14 DIAGNOSIS — J440 Chronic obstructive pulmonary disease with acute lower respiratory infection: Secondary | ICD-10-CM | POA: Diagnosis present

## 2017-11-14 DIAGNOSIS — M25551 Pain in right hip: Secondary | ICD-10-CM | POA: Diagnosis not present

## 2017-11-14 DIAGNOSIS — G9341 Metabolic encephalopathy: Secondary | ICD-10-CM | POA: Diagnosis present

## 2017-11-14 DIAGNOSIS — M25561 Pain in right knee: Secondary | ICD-10-CM | POA: Diagnosis not present

## 2017-11-14 DIAGNOSIS — E039 Hypothyroidism, unspecified: Secondary | ICD-10-CM | POA: Diagnosis not present

## 2017-11-14 DIAGNOSIS — C675 Malignant neoplasm of bladder neck: Secondary | ICD-10-CM | POA: Diagnosis not present

## 2017-11-14 DIAGNOSIS — R4182 Altered mental status, unspecified: Secondary | ICD-10-CM | POA: Diagnosis not present

## 2017-11-14 DIAGNOSIS — Z9181 History of falling: Secondary | ICD-10-CM | POA: Diagnosis not present

## 2017-11-14 DIAGNOSIS — Z7901 Long term (current) use of anticoagulants: Secondary | ICD-10-CM | POA: Diagnosis not present

## 2017-11-14 DIAGNOSIS — N390 Urinary tract infection, site not specified: Secondary | ICD-10-CM | POA: Diagnosis not present

## 2017-11-14 DIAGNOSIS — I2510 Atherosclerotic heart disease of native coronary artery without angina pectoris: Secondary | ICD-10-CM | POA: Diagnosis not present

## 2017-11-14 DIAGNOSIS — T148XXA Other injury of unspecified body region, initial encounter: Secondary | ICD-10-CM | POA: Diagnosis not present

## 2017-11-14 DIAGNOSIS — I252 Old myocardial infarction: Secondary | ICD-10-CM | POA: Diagnosis not present

## 2017-11-14 DIAGNOSIS — A419 Sepsis, unspecified organism: Secondary | ICD-10-CM | POA: Diagnosis not present

## 2017-11-14 DIAGNOSIS — S199XXA Unspecified injury of neck, initial encounter: Secondary | ICD-10-CM | POA: Diagnosis not present

## 2017-11-14 DIAGNOSIS — Z79899 Other long term (current) drug therapy: Secondary | ICD-10-CM | POA: Diagnosis not present

## 2017-11-14 DIAGNOSIS — E119 Type 2 diabetes mellitus without complications: Secondary | ICD-10-CM | POA: Diagnosis not present

## 2017-11-14 DIAGNOSIS — B370 Candidal stomatitis: Secondary | ICD-10-CM | POA: Diagnosis not present

## 2017-11-14 DIAGNOSIS — S59901A Unspecified injury of right elbow, initial encounter: Secondary | ICD-10-CM | POA: Diagnosis not present

## 2017-11-14 DIAGNOSIS — E1122 Type 2 diabetes mellitus with diabetic chronic kidney disease: Secondary | ICD-10-CM | POA: Diagnosis present

## 2017-11-14 DIAGNOSIS — Z7982 Long term (current) use of aspirin: Secondary | ICD-10-CM | POA: Diagnosis not present

## 2017-11-14 DIAGNOSIS — J181 Lobar pneumonia, unspecified organism: Secondary | ICD-10-CM | POA: Diagnosis not present

## 2017-11-14 DIAGNOSIS — Z23 Encounter for immunization: Secondary | ICD-10-CM | POA: Diagnosis not present

## 2017-11-14 DIAGNOSIS — M79661 Pain in right lower leg: Secondary | ICD-10-CM | POA: Diagnosis not present

## 2017-11-14 DIAGNOSIS — E785 Hyperlipidemia, unspecified: Secondary | ICD-10-CM | POA: Diagnosis not present

## 2017-11-14 DIAGNOSIS — J449 Chronic obstructive pulmonary disease, unspecified: Secondary | ICD-10-CM | POA: Diagnosis not present

## 2017-11-14 DIAGNOSIS — M25521 Pain in right elbow: Secondary | ICD-10-CM | POA: Diagnosis not present

## 2017-11-14 DIAGNOSIS — Z87891 Personal history of nicotine dependence: Secondary | ICD-10-CM | POA: Diagnosis not present

## 2017-11-15 DIAGNOSIS — A419 Sepsis, unspecified organism: Secondary | ICD-10-CM

## 2017-11-15 DIAGNOSIS — M79662 Pain in left lower leg: Secondary | ICD-10-CM

## 2017-11-15 DIAGNOSIS — N390 Urinary tract infection, site not specified: Secondary | ICD-10-CM

## 2017-11-15 DIAGNOSIS — B370 Candidal stomatitis: Secondary | ICD-10-CM

## 2017-11-15 DIAGNOSIS — J181 Lobar pneumonia, unspecified organism: Secondary | ICD-10-CM

## 2017-11-15 DIAGNOSIS — C675 Malignant neoplasm of bladder neck: Secondary | ICD-10-CM

## 2017-11-15 DIAGNOSIS — R4182 Altered mental status, unspecified: Secondary | ICD-10-CM

## 2017-11-18 DIAGNOSIS — C675 Malignant neoplasm of bladder neck: Secondary | ICD-10-CM | POA: Diagnosis not present

## 2017-11-19 DIAGNOSIS — C675 Malignant neoplasm of bladder neck: Secondary | ICD-10-CM | POA: Diagnosis not present

## 2017-11-20 DIAGNOSIS — Z510 Encounter for antineoplastic radiation therapy: Secondary | ICD-10-CM | POA: Diagnosis not present

## 2017-11-20 DIAGNOSIS — C675 Malignant neoplasm of bladder neck: Secondary | ICD-10-CM | POA: Diagnosis not present

## 2017-11-23 DIAGNOSIS — Z510 Encounter for antineoplastic radiation therapy: Secondary | ICD-10-CM | POA: Diagnosis not present

## 2017-11-23 DIAGNOSIS — C675 Malignant neoplasm of bladder neck: Secondary | ICD-10-CM | POA: Diagnosis not present

## 2017-11-25 DIAGNOSIS — C675 Malignant neoplasm of bladder neck: Secondary | ICD-10-CM | POA: Diagnosis not present

## 2017-11-25 DIAGNOSIS — Z09 Encounter for follow-up examination after completed treatment for conditions other than malignant neoplasm: Secondary | ICD-10-CM | POA: Diagnosis not present

## 2017-11-25 DIAGNOSIS — Z510 Encounter for antineoplastic radiation therapy: Secondary | ICD-10-CM | POA: Diagnosis not present

## 2017-11-26 DIAGNOSIS — M4850XA Collapsed vertebra, not elsewhere classified, site unspecified, initial encounter for fracture: Secondary | ICD-10-CM | POA: Diagnosis not present

## 2017-11-26 DIAGNOSIS — Z510 Encounter for antineoplastic radiation therapy: Secondary | ICD-10-CM | POA: Diagnosis not present

## 2017-11-26 DIAGNOSIS — E663 Overweight: Secondary | ICD-10-CM | POA: Diagnosis not present

## 2017-11-26 DIAGNOSIS — C679 Malignant neoplasm of bladder, unspecified: Secondary | ICD-10-CM | POA: Diagnosis not present

## 2017-11-26 DIAGNOSIS — C675 Malignant neoplasm of bladder neck: Secondary | ICD-10-CM | POA: Diagnosis not present

## 2017-11-26 DIAGNOSIS — E782 Mixed hyperlipidemia: Secondary | ICD-10-CM | POA: Diagnosis not present

## 2017-11-26 DIAGNOSIS — E669 Obesity, unspecified: Secondary | ICD-10-CM | POA: Diagnosis not present

## 2017-11-26 DIAGNOSIS — J180 Bronchopneumonia, unspecified organism: Secondary | ICD-10-CM | POA: Diagnosis not present

## 2017-11-27 DIAGNOSIS — C675 Malignant neoplasm of bladder neck: Secondary | ICD-10-CM | POA: Diagnosis not present

## 2017-11-27 DIAGNOSIS — Z510 Encounter for antineoplastic radiation therapy: Secondary | ICD-10-CM | POA: Diagnosis not present

## 2017-11-30 DIAGNOSIS — Z510 Encounter for antineoplastic radiation therapy: Secondary | ICD-10-CM | POA: Diagnosis not present

## 2017-11-30 DIAGNOSIS — C675 Malignant neoplasm of bladder neck: Secondary | ICD-10-CM | POA: Diagnosis not present

## 2017-12-02 DIAGNOSIS — C675 Malignant neoplasm of bladder neck: Secondary | ICD-10-CM | POA: Diagnosis not present

## 2017-12-02 DIAGNOSIS — M1612 Unilateral primary osteoarthritis, left hip: Secondary | ICD-10-CM | POA: Diagnosis not present

## 2017-12-02 DIAGNOSIS — Z510 Encounter for antineoplastic radiation therapy: Secondary | ICD-10-CM | POA: Diagnosis not present

## 2017-12-02 DIAGNOSIS — M79662 Pain in left lower leg: Secondary | ICD-10-CM | POA: Diagnosis not present

## 2017-12-02 DIAGNOSIS — M25562 Pain in left knee: Secondary | ICD-10-CM | POA: Diagnosis not present

## 2017-12-03 DIAGNOSIS — I4510 Unspecified right bundle-branch block: Secondary | ICD-10-CM | POA: Diagnosis not present

## 2017-12-03 DIAGNOSIS — M25552 Pain in left hip: Secondary | ICD-10-CM | POA: Diagnosis not present

## 2017-12-03 DIAGNOSIS — C675 Malignant neoplasm of bladder neck: Secondary | ICD-10-CM | POA: Diagnosis not present

## 2017-12-03 DIAGNOSIS — I4891 Unspecified atrial fibrillation: Secondary | ICD-10-CM | POA: Diagnosis not present

## 2017-12-03 DIAGNOSIS — Z515 Encounter for palliative care: Secondary | ICD-10-CM | POA: Diagnosis not present

## 2017-12-03 DIAGNOSIS — C775 Secondary and unspecified malignant neoplasm of intrapelvic lymph nodes: Secondary | ICD-10-CM | POA: Diagnosis not present

## 2017-12-04 DIAGNOSIS — Z0001 Encounter for general adult medical examination with abnormal findings: Secondary | ICD-10-CM | POA: Diagnosis not present

## 2017-12-04 DIAGNOSIS — C675 Malignant neoplasm of bladder neck: Secondary | ICD-10-CM | POA: Diagnosis not present

## 2017-12-07 DIAGNOSIS — C675 Malignant neoplasm of bladder neck: Secondary | ICD-10-CM | POA: Diagnosis not present

## 2017-12-08 DIAGNOSIS — C675 Malignant neoplasm of bladder neck: Secondary | ICD-10-CM | POA: Diagnosis not present

## 2017-12-08 DIAGNOSIS — Z951 Presence of aortocoronary bypass graft: Secondary | ICD-10-CM

## 2017-12-08 HISTORY — DX: Presence of aortocoronary bypass graft: Z95.1

## 2017-12-09 DIAGNOSIS — C675 Malignant neoplasm of bladder neck: Secondary | ICD-10-CM | POA: Diagnosis not present

## 2017-12-10 DIAGNOSIS — N3281 Overactive bladder: Secondary | ICD-10-CM | POA: Diagnosis not present

## 2017-12-10 DIAGNOSIS — C672 Malignant neoplasm of lateral wall of bladder: Secondary | ICD-10-CM | POA: Diagnosis not present

## 2017-12-10 DIAGNOSIS — C675 Malignant neoplasm of bladder neck: Secondary | ICD-10-CM | POA: Diagnosis not present

## 2017-12-11 DIAGNOSIS — C675 Malignant neoplasm of bladder neck: Secondary | ICD-10-CM | POA: Diagnosis not present

## 2017-12-14 DIAGNOSIS — C675 Malignant neoplasm of bladder neck: Secondary | ICD-10-CM | POA: Diagnosis not present

## 2017-12-15 DIAGNOSIS — C675 Malignant neoplasm of bladder neck: Secondary | ICD-10-CM | POA: Diagnosis not present

## 2017-12-16 DIAGNOSIS — H401121 Primary open-angle glaucoma, left eye, mild stage: Secondary | ICD-10-CM | POA: Diagnosis not present

## 2017-12-16 DIAGNOSIS — C675 Malignant neoplasm of bladder neck: Secondary | ICD-10-CM | POA: Diagnosis not present

## 2017-12-16 DIAGNOSIS — H401113 Primary open-angle glaucoma, right eye, severe stage: Secondary | ICD-10-CM | POA: Diagnosis not present

## 2017-12-17 DIAGNOSIS — C675 Malignant neoplasm of bladder neck: Secondary | ICD-10-CM | POA: Diagnosis not present

## 2017-12-18 DIAGNOSIS — C675 Malignant neoplasm of bladder neck: Secondary | ICD-10-CM | POA: Diagnosis not present

## 2017-12-21 DIAGNOSIS — C675 Malignant neoplasm of bladder neck: Secondary | ICD-10-CM | POA: Diagnosis not present

## 2017-12-22 DIAGNOSIS — C675 Malignant neoplasm of bladder neck: Secondary | ICD-10-CM | POA: Diagnosis not present

## 2017-12-23 DIAGNOSIS — H02125 Mechanical ectropion of left lower eyelid: Secondary | ICD-10-CM | POA: Diagnosis not present

## 2017-12-23 DIAGNOSIS — H401121 Primary open-angle glaucoma, left eye, mild stage: Secondary | ICD-10-CM | POA: Diagnosis not present

## 2017-12-23 DIAGNOSIS — R531 Weakness: Secondary | ICD-10-CM | POA: Diagnosis not present

## 2017-12-23 DIAGNOSIS — C675 Malignant neoplasm of bladder neck: Secondary | ICD-10-CM | POA: Diagnosis not present

## 2017-12-23 DIAGNOSIS — Z515 Encounter for palliative care: Secondary | ICD-10-CM | POA: Diagnosis not present

## 2017-12-23 DIAGNOSIS — H02122 Mechanical ectropion of right lower eyelid: Secondary | ICD-10-CM | POA: Diagnosis not present

## 2017-12-23 DIAGNOSIS — H401113 Primary open-angle glaucoma, right eye, severe stage: Secondary | ICD-10-CM | POA: Diagnosis not present

## 2017-12-24 DIAGNOSIS — C675 Malignant neoplasm of bladder neck: Secondary | ICD-10-CM | POA: Diagnosis not present

## 2017-12-24 DIAGNOSIS — R197 Diarrhea, unspecified: Secondary | ICD-10-CM | POA: Diagnosis not present

## 2017-12-24 DIAGNOSIS — Z515 Encounter for palliative care: Secondary | ICD-10-CM | POA: Diagnosis not present

## 2017-12-24 DIAGNOSIS — R531 Weakness: Secondary | ICD-10-CM | POA: Diagnosis not present

## 2017-12-25 DIAGNOSIS — Z510 Encounter for antineoplastic radiation therapy: Secondary | ICD-10-CM | POA: Diagnosis not present

## 2017-12-25 DIAGNOSIS — C675 Malignant neoplasm of bladder neck: Secondary | ICD-10-CM | POA: Diagnosis not present

## 2017-12-28 DIAGNOSIS — C679 Malignant neoplasm of bladder, unspecified: Secondary | ICD-10-CM | POA: Diagnosis not present

## 2017-12-28 DIAGNOSIS — J449 Chronic obstructive pulmonary disease, unspecified: Secondary | ICD-10-CM | POA: Diagnosis not present

## 2017-12-28 DIAGNOSIS — M4850XA Collapsed vertebra, not elsewhere classified, site unspecified, initial encounter for fracture: Secondary | ICD-10-CM | POA: Diagnosis not present

## 2017-12-28 DIAGNOSIS — I2510 Atherosclerotic heart disease of native coronary artery without angina pectoris: Secondary | ICD-10-CM | POA: Diagnosis not present

## 2017-12-28 DIAGNOSIS — C675 Malignant neoplasm of bladder neck: Secondary | ICD-10-CM | POA: Diagnosis not present

## 2017-12-28 DIAGNOSIS — E782 Mixed hyperlipidemia: Secondary | ICD-10-CM | POA: Diagnosis not present

## 2017-12-28 DIAGNOSIS — Z1389 Encounter for screening for other disorder: Secondary | ICD-10-CM | POA: Diagnosis not present

## 2017-12-28 DIAGNOSIS — Z9181 History of falling: Secondary | ICD-10-CM | POA: Diagnosis not present

## 2017-12-28 DIAGNOSIS — I10 Essential (primary) hypertension: Secondary | ICD-10-CM | POA: Diagnosis not present

## 2017-12-28 DIAGNOSIS — Z510 Encounter for antineoplastic radiation therapy: Secondary | ICD-10-CM | POA: Diagnosis not present

## 2017-12-28 DIAGNOSIS — E1151 Type 2 diabetes mellitus with diabetic peripheral angiopathy without gangrene: Secondary | ICD-10-CM | POA: Diagnosis not present

## 2017-12-28 DIAGNOSIS — M170 Bilateral primary osteoarthritis of knee: Secondary | ICD-10-CM | POA: Diagnosis not present

## 2017-12-28 DIAGNOSIS — N184 Chronic kidney disease, stage 4 (severe): Secondary | ICD-10-CM | POA: Diagnosis not present

## 2017-12-28 DIAGNOSIS — Z1331 Encounter for screening for depression: Secondary | ICD-10-CM | POA: Diagnosis not present

## 2017-12-28 DIAGNOSIS — I509 Heart failure, unspecified: Secondary | ICD-10-CM | POA: Diagnosis not present

## 2017-12-29 DIAGNOSIS — C675 Malignant neoplasm of bladder neck: Secondary | ICD-10-CM | POA: Diagnosis not present

## 2017-12-29 DIAGNOSIS — Z510 Encounter for antineoplastic radiation therapy: Secondary | ICD-10-CM | POA: Diagnosis not present

## 2017-12-30 DIAGNOSIS — C675 Malignant neoplasm of bladder neck: Secondary | ICD-10-CM | POA: Diagnosis not present

## 2017-12-30 DIAGNOSIS — Z510 Encounter for antineoplastic radiation therapy: Secondary | ICD-10-CM | POA: Diagnosis not present

## 2017-12-31 DIAGNOSIS — R197 Diarrhea, unspecified: Secondary | ICD-10-CM | POA: Diagnosis not present

## 2017-12-31 DIAGNOSIS — C675 Malignant neoplasm of bladder neck: Secondary | ICD-10-CM | POA: Diagnosis not present

## 2018-01-01 DIAGNOSIS — Z510 Encounter for antineoplastic radiation therapy: Secondary | ICD-10-CM | POA: Diagnosis not present

## 2018-01-01 DIAGNOSIS — C675 Malignant neoplasm of bladder neck: Secondary | ICD-10-CM | POA: Diagnosis not present

## 2018-01-04 DIAGNOSIS — C675 Malignant neoplasm of bladder neck: Secondary | ICD-10-CM | POA: Diagnosis not present

## 2018-01-04 DIAGNOSIS — Z510 Encounter for antineoplastic radiation therapy: Secondary | ICD-10-CM | POA: Diagnosis not present

## 2018-01-05 DIAGNOSIS — C675 Malignant neoplasm of bladder neck: Secondary | ICD-10-CM | POA: Diagnosis not present

## 2018-01-05 DIAGNOSIS — Z510 Encounter for antineoplastic radiation therapy: Secondary | ICD-10-CM | POA: Diagnosis not present

## 2018-01-06 DIAGNOSIS — C675 Malignant neoplasm of bladder neck: Secondary | ICD-10-CM | POA: Diagnosis not present

## 2018-01-06 DIAGNOSIS — Z510 Encounter for antineoplastic radiation therapy: Secondary | ICD-10-CM | POA: Diagnosis not present

## 2018-01-07 DIAGNOSIS — Z510 Encounter for antineoplastic radiation therapy: Secondary | ICD-10-CM | POA: Diagnosis not present

## 2018-01-07 DIAGNOSIS — C675 Malignant neoplasm of bladder neck: Secondary | ICD-10-CM | POA: Diagnosis not present

## 2018-01-21 DIAGNOSIS — C675 Malignant neoplasm of bladder neck: Secondary | ICD-10-CM | POA: Diagnosis not present

## 2018-01-21 DIAGNOSIS — Z515 Encounter for palliative care: Secondary | ICD-10-CM | POA: Diagnosis not present

## 2018-01-27 DIAGNOSIS — C672 Malignant neoplasm of lateral wall of bladder: Secondary | ICD-10-CM | POA: Diagnosis not present

## 2018-01-27 DIAGNOSIS — N3281 Overactive bladder: Secondary | ICD-10-CM | POA: Diagnosis not present

## 2018-02-10 DIAGNOSIS — J439 Emphysema, unspecified: Secondary | ICD-10-CM | POA: Diagnosis not present

## 2018-02-10 DIAGNOSIS — C675 Malignant neoplasm of bladder neck: Secondary | ICD-10-CM | POA: Diagnosis not present

## 2018-02-10 DIAGNOSIS — Z79899 Other long term (current) drug therapy: Secondary | ICD-10-CM | POA: Diagnosis not present

## 2018-02-10 DIAGNOSIS — N200 Calculus of kidney: Secondary | ICD-10-CM | POA: Diagnosis not present

## 2018-02-10 DIAGNOSIS — I700 Atherosclerosis of aorta: Secondary | ICD-10-CM | POA: Diagnosis not present

## 2018-02-10 DIAGNOSIS — M4856XS Collapsed vertebra, not elsewhere classified, lumbar region, sequela of fracture: Secondary | ICD-10-CM | POA: Diagnosis not present

## 2018-02-11 DIAGNOSIS — Z8551 Personal history of malignant neoplasm of bladder: Secondary | ICD-10-CM | POA: Diagnosis not present

## 2018-02-11 DIAGNOSIS — C675 Malignant neoplasm of bladder neck: Secondary | ICD-10-CM | POA: Diagnosis not present

## 2018-02-11 DIAGNOSIS — Z923 Personal history of irradiation: Secondary | ICD-10-CM | POA: Diagnosis not present

## 2018-02-11 DIAGNOSIS — Z9221 Personal history of antineoplastic chemotherapy: Secondary | ICD-10-CM | POA: Diagnosis not present

## 2018-02-16 DIAGNOSIS — M4850XA Collapsed vertebra, not elsewhere classified, site unspecified, initial encounter for fracture: Secondary | ICD-10-CM | POA: Diagnosis not present

## 2018-02-16 DIAGNOSIS — D692 Other nonthrombocytopenic purpura: Secondary | ICD-10-CM | POA: Diagnosis not present

## 2018-02-16 DIAGNOSIS — C679 Malignant neoplasm of bladder, unspecified: Secondary | ICD-10-CM | POA: Diagnosis not present

## 2018-02-16 DIAGNOSIS — J449 Chronic obstructive pulmonary disease, unspecified: Secondary | ICD-10-CM | POA: Diagnosis not present

## 2018-02-16 DIAGNOSIS — Z79899 Other long term (current) drug therapy: Secondary | ICD-10-CM | POA: Diagnosis not present

## 2018-02-16 DIAGNOSIS — I509 Heart failure, unspecified: Secondary | ICD-10-CM | POA: Diagnosis not present

## 2018-02-16 DIAGNOSIS — I10 Essential (primary) hypertension: Secondary | ICD-10-CM | POA: Diagnosis not present

## 2018-02-16 DIAGNOSIS — E782 Mixed hyperlipidemia: Secondary | ICD-10-CM | POA: Diagnosis not present

## 2018-02-16 DIAGNOSIS — E1151 Type 2 diabetes mellitus with diabetic peripheral angiopathy without gangrene: Secondary | ICD-10-CM | POA: Diagnosis not present

## 2018-02-16 DIAGNOSIS — Z6828 Body mass index (BMI) 28.0-28.9, adult: Secondary | ICD-10-CM | POA: Diagnosis not present

## 2018-02-16 DIAGNOSIS — I2510 Atherosclerotic heart disease of native coronary artery without angina pectoris: Secondary | ICD-10-CM | POA: Diagnosis not present

## 2018-02-24 DIAGNOSIS — R2689 Other abnormalities of gait and mobility: Secondary | ICD-10-CM | POA: Diagnosis not present

## 2018-02-24 DIAGNOSIS — R293 Abnormal posture: Secondary | ICD-10-CM | POA: Diagnosis not present

## 2018-02-24 DIAGNOSIS — M545 Low back pain: Secondary | ICD-10-CM | POA: Diagnosis not present

## 2018-02-24 DIAGNOSIS — M6281 Muscle weakness (generalized): Secondary | ICD-10-CM | POA: Diagnosis not present

## 2018-02-24 DIAGNOSIS — M2560 Stiffness of unspecified joint, not elsewhere classified: Secondary | ICD-10-CM | POA: Diagnosis not present

## 2018-02-24 DIAGNOSIS — C678 Malignant neoplasm of overlapping sites of bladder: Secondary | ICD-10-CM | POA: Diagnosis not present

## 2018-02-24 DIAGNOSIS — R5383 Other fatigue: Secondary | ICD-10-CM | POA: Diagnosis not present

## 2018-02-24 DIAGNOSIS — R2681 Unsteadiness on feet: Secondary | ICD-10-CM | POA: Diagnosis not present

## 2018-02-26 DIAGNOSIS — R293 Abnormal posture: Secondary | ICD-10-CM | POA: Diagnosis not present

## 2018-02-26 DIAGNOSIS — M6281 Muscle weakness (generalized): Secondary | ICD-10-CM | POA: Diagnosis not present

## 2018-02-26 DIAGNOSIS — R5383 Other fatigue: Secondary | ICD-10-CM | POA: Diagnosis not present

## 2018-02-26 DIAGNOSIS — C678 Malignant neoplasm of overlapping sites of bladder: Secondary | ICD-10-CM | POA: Diagnosis not present

## 2018-02-26 DIAGNOSIS — M545 Low back pain: Secondary | ICD-10-CM | POA: Diagnosis not present

## 2018-02-26 DIAGNOSIS — R2681 Unsteadiness on feet: Secondary | ICD-10-CM | POA: Diagnosis not present

## 2018-03-02 DIAGNOSIS — M545 Low back pain: Secondary | ICD-10-CM | POA: Diagnosis not present

## 2018-03-02 DIAGNOSIS — M2560 Stiffness of unspecified joint, not elsewhere classified: Secondary | ICD-10-CM | POA: Diagnosis not present

## 2018-03-02 DIAGNOSIS — R293 Abnormal posture: Secondary | ICD-10-CM | POA: Diagnosis not present

## 2018-03-02 DIAGNOSIS — M6281 Muscle weakness (generalized): Secondary | ICD-10-CM | POA: Diagnosis not present

## 2018-03-02 DIAGNOSIS — R2681 Unsteadiness on feet: Secondary | ICD-10-CM | POA: Diagnosis not present

## 2018-03-02 DIAGNOSIS — C678 Malignant neoplasm of overlapping sites of bladder: Secondary | ICD-10-CM | POA: Diagnosis not present

## 2018-03-02 DIAGNOSIS — R5383 Other fatigue: Secondary | ICD-10-CM | POA: Diagnosis not present

## 2018-03-02 DIAGNOSIS — R2689 Other abnormalities of gait and mobility: Secondary | ICD-10-CM | POA: Diagnosis not present

## 2018-03-04 DIAGNOSIS — M6281 Muscle weakness (generalized): Secondary | ICD-10-CM | POA: Diagnosis not present

## 2018-03-04 DIAGNOSIS — R2681 Unsteadiness on feet: Secondary | ICD-10-CM | POA: Diagnosis not present

## 2018-03-04 DIAGNOSIS — C678 Malignant neoplasm of overlapping sites of bladder: Secondary | ICD-10-CM | POA: Diagnosis not present

## 2018-03-04 DIAGNOSIS — M545 Low back pain: Secondary | ICD-10-CM | POA: Diagnosis not present

## 2018-03-04 DIAGNOSIS — R5383 Other fatigue: Secondary | ICD-10-CM | POA: Diagnosis not present

## 2018-03-04 DIAGNOSIS — R293 Abnormal posture: Secondary | ICD-10-CM | POA: Diagnosis not present

## 2018-03-09 DIAGNOSIS — M545 Low back pain: Secondary | ICD-10-CM | POA: Diagnosis not present

## 2018-03-09 DIAGNOSIS — R293 Abnormal posture: Secondary | ICD-10-CM | POA: Diagnosis not present

## 2018-03-09 DIAGNOSIS — M6281 Muscle weakness (generalized): Secondary | ICD-10-CM | POA: Diagnosis not present

## 2018-03-09 DIAGNOSIS — R2681 Unsteadiness on feet: Secondary | ICD-10-CM | POA: Diagnosis not present

## 2018-03-09 DIAGNOSIS — R5383 Other fatigue: Secondary | ICD-10-CM | POA: Diagnosis not present

## 2018-03-09 DIAGNOSIS — C678 Malignant neoplasm of overlapping sites of bladder: Secondary | ICD-10-CM | POA: Diagnosis not present

## 2018-03-10 DIAGNOSIS — C672 Malignant neoplasm of lateral wall of bladder: Secondary | ICD-10-CM | POA: Diagnosis not present

## 2018-03-10 DIAGNOSIS — N401 Enlarged prostate with lower urinary tract symptoms: Secondary | ICD-10-CM | POA: Diagnosis not present

## 2018-03-11 DIAGNOSIS — C678 Malignant neoplasm of overlapping sites of bladder: Secondary | ICD-10-CM | POA: Diagnosis not present

## 2018-03-11 DIAGNOSIS — M6281 Muscle weakness (generalized): Secondary | ICD-10-CM | POA: Diagnosis not present

## 2018-03-11 DIAGNOSIS — M545 Low back pain: Secondary | ICD-10-CM | POA: Diagnosis not present

## 2018-03-11 DIAGNOSIS — R293 Abnormal posture: Secondary | ICD-10-CM | POA: Diagnosis not present

## 2018-03-11 DIAGNOSIS — R2681 Unsteadiness on feet: Secondary | ICD-10-CM | POA: Diagnosis not present

## 2018-03-11 DIAGNOSIS — R5383 Other fatigue: Secondary | ICD-10-CM | POA: Diagnosis not present

## 2018-03-16 DIAGNOSIS — R2681 Unsteadiness on feet: Secondary | ICD-10-CM | POA: Diagnosis not present

## 2018-03-16 DIAGNOSIS — R293 Abnormal posture: Secondary | ICD-10-CM | POA: Diagnosis not present

## 2018-03-16 DIAGNOSIS — R5383 Other fatigue: Secondary | ICD-10-CM | POA: Diagnosis not present

## 2018-03-16 DIAGNOSIS — M6281 Muscle weakness (generalized): Secondary | ICD-10-CM | POA: Diagnosis not present

## 2018-03-16 DIAGNOSIS — C678 Malignant neoplasm of overlapping sites of bladder: Secondary | ICD-10-CM | POA: Diagnosis not present

## 2018-03-16 DIAGNOSIS — M545 Low back pain: Secondary | ICD-10-CM | POA: Diagnosis not present

## 2018-03-17 DIAGNOSIS — H401113 Primary open-angle glaucoma, right eye, severe stage: Secondary | ICD-10-CM | POA: Diagnosis not present

## 2018-03-18 DIAGNOSIS — R2681 Unsteadiness on feet: Secondary | ICD-10-CM | POA: Diagnosis not present

## 2018-03-18 DIAGNOSIS — R293 Abnormal posture: Secondary | ICD-10-CM | POA: Diagnosis not present

## 2018-03-18 DIAGNOSIS — C678 Malignant neoplasm of overlapping sites of bladder: Secondary | ICD-10-CM | POA: Diagnosis not present

## 2018-03-18 DIAGNOSIS — R5383 Other fatigue: Secondary | ICD-10-CM | POA: Diagnosis not present

## 2018-03-18 DIAGNOSIS — M545 Low back pain: Secondary | ICD-10-CM | POA: Diagnosis not present

## 2018-03-18 DIAGNOSIS — M6281 Muscle weakness (generalized): Secondary | ICD-10-CM | POA: Diagnosis not present

## 2018-03-23 DIAGNOSIS — R293 Abnormal posture: Secondary | ICD-10-CM | POA: Diagnosis not present

## 2018-03-23 DIAGNOSIS — R5383 Other fatigue: Secondary | ICD-10-CM | POA: Diagnosis not present

## 2018-03-23 DIAGNOSIS — M545 Low back pain: Secondary | ICD-10-CM | POA: Diagnosis not present

## 2018-03-23 DIAGNOSIS — R2681 Unsteadiness on feet: Secondary | ICD-10-CM | POA: Diagnosis not present

## 2018-03-23 DIAGNOSIS — M6281 Muscle weakness (generalized): Secondary | ICD-10-CM | POA: Diagnosis not present

## 2018-03-23 DIAGNOSIS — C678 Malignant neoplasm of overlapping sites of bladder: Secondary | ICD-10-CM | POA: Diagnosis not present

## 2018-03-25 DIAGNOSIS — R5383 Other fatigue: Secondary | ICD-10-CM | POA: Diagnosis not present

## 2018-03-25 DIAGNOSIS — M6281 Muscle weakness (generalized): Secondary | ICD-10-CM | POA: Diagnosis not present

## 2018-03-25 DIAGNOSIS — M545 Low back pain: Secondary | ICD-10-CM | POA: Diagnosis not present

## 2018-03-25 DIAGNOSIS — R2681 Unsteadiness on feet: Secondary | ICD-10-CM | POA: Diagnosis not present

## 2018-03-25 DIAGNOSIS — C678 Malignant neoplasm of overlapping sites of bladder: Secondary | ICD-10-CM | POA: Diagnosis not present

## 2018-03-25 DIAGNOSIS — R293 Abnormal posture: Secondary | ICD-10-CM | POA: Diagnosis not present

## 2018-03-30 DIAGNOSIS — M6281 Muscle weakness (generalized): Secondary | ICD-10-CM | POA: Diagnosis not present

## 2018-03-30 DIAGNOSIS — M545 Low back pain: Secondary | ICD-10-CM | POA: Diagnosis not present

## 2018-03-30 DIAGNOSIS — R2681 Unsteadiness on feet: Secondary | ICD-10-CM | POA: Diagnosis not present

## 2018-03-30 DIAGNOSIS — C678 Malignant neoplasm of overlapping sites of bladder: Secondary | ICD-10-CM | POA: Diagnosis not present

## 2018-03-30 DIAGNOSIS — R5383 Other fatigue: Secondary | ICD-10-CM | POA: Diagnosis not present

## 2018-03-30 DIAGNOSIS — R293 Abnormal posture: Secondary | ICD-10-CM | POA: Diagnosis not present

## 2018-04-01 DIAGNOSIS — R5383 Other fatigue: Secondary | ICD-10-CM | POA: Diagnosis not present

## 2018-04-01 DIAGNOSIS — R2681 Unsteadiness on feet: Secondary | ICD-10-CM | POA: Diagnosis not present

## 2018-04-01 DIAGNOSIS — M2560 Stiffness of unspecified joint, not elsewhere classified: Secondary | ICD-10-CM | POA: Diagnosis not present

## 2018-04-01 DIAGNOSIS — R293 Abnormal posture: Secondary | ICD-10-CM | POA: Diagnosis not present

## 2018-04-01 DIAGNOSIS — M545 Low back pain: Secondary | ICD-10-CM | POA: Diagnosis not present

## 2018-04-01 DIAGNOSIS — M6281 Muscle weakness (generalized): Secondary | ICD-10-CM | POA: Diagnosis not present

## 2018-04-01 DIAGNOSIS — R2689 Other abnormalities of gait and mobility: Secondary | ICD-10-CM | POA: Diagnosis not present

## 2018-04-01 DIAGNOSIS — C678 Malignant neoplasm of overlapping sites of bladder: Secondary | ICD-10-CM | POA: Diagnosis not present

## 2018-04-06 DIAGNOSIS — M545 Low back pain: Secondary | ICD-10-CM | POA: Diagnosis not present

## 2018-04-06 DIAGNOSIS — R5383 Other fatigue: Secondary | ICD-10-CM | POA: Diagnosis not present

## 2018-04-06 DIAGNOSIS — C678 Malignant neoplasm of overlapping sites of bladder: Secondary | ICD-10-CM | POA: Diagnosis not present

## 2018-04-06 DIAGNOSIS — M6281 Muscle weakness (generalized): Secondary | ICD-10-CM | POA: Diagnosis not present

## 2018-04-06 DIAGNOSIS — R2681 Unsteadiness on feet: Secondary | ICD-10-CM | POA: Diagnosis not present

## 2018-04-06 DIAGNOSIS — R293 Abnormal posture: Secondary | ICD-10-CM | POA: Diagnosis not present

## 2018-04-08 DIAGNOSIS — R2681 Unsteadiness on feet: Secondary | ICD-10-CM | POA: Diagnosis not present

## 2018-04-08 DIAGNOSIS — R5383 Other fatigue: Secondary | ICD-10-CM | POA: Diagnosis not present

## 2018-04-08 DIAGNOSIS — R293 Abnormal posture: Secondary | ICD-10-CM | POA: Diagnosis not present

## 2018-04-08 DIAGNOSIS — C678 Malignant neoplasm of overlapping sites of bladder: Secondary | ICD-10-CM | POA: Diagnosis not present

## 2018-04-08 DIAGNOSIS — M6281 Muscle weakness (generalized): Secondary | ICD-10-CM | POA: Diagnosis not present

## 2018-04-08 DIAGNOSIS — M545 Low back pain: Secondary | ICD-10-CM | POA: Diagnosis not present

## 2018-04-13 DIAGNOSIS — R5383 Other fatigue: Secondary | ICD-10-CM | POA: Diagnosis not present

## 2018-04-13 DIAGNOSIS — C678 Malignant neoplasm of overlapping sites of bladder: Secondary | ICD-10-CM | POA: Diagnosis not present

## 2018-04-13 DIAGNOSIS — M545 Low back pain: Secondary | ICD-10-CM | POA: Diagnosis not present

## 2018-04-13 DIAGNOSIS — M6281 Muscle weakness (generalized): Secondary | ICD-10-CM | POA: Diagnosis not present

## 2018-04-13 DIAGNOSIS — R2681 Unsteadiness on feet: Secondary | ICD-10-CM | POA: Diagnosis not present

## 2018-04-13 DIAGNOSIS — R293 Abnormal posture: Secondary | ICD-10-CM | POA: Diagnosis not present

## 2018-04-15 DIAGNOSIS — R293 Abnormal posture: Secondary | ICD-10-CM | POA: Diagnosis not present

## 2018-04-15 DIAGNOSIS — M6281 Muscle weakness (generalized): Secondary | ICD-10-CM | POA: Diagnosis not present

## 2018-04-15 DIAGNOSIS — C678 Malignant neoplasm of overlapping sites of bladder: Secondary | ICD-10-CM | POA: Diagnosis not present

## 2018-04-15 DIAGNOSIS — R5383 Other fatigue: Secondary | ICD-10-CM | POA: Diagnosis not present

## 2018-04-15 DIAGNOSIS — M545 Low back pain: Secondary | ICD-10-CM | POA: Diagnosis not present

## 2018-04-15 DIAGNOSIS — R2681 Unsteadiness on feet: Secondary | ICD-10-CM | POA: Diagnosis not present

## 2018-04-20 DIAGNOSIS — R2681 Unsteadiness on feet: Secondary | ICD-10-CM | POA: Diagnosis not present

## 2018-04-20 DIAGNOSIS — M545 Low back pain: Secondary | ICD-10-CM | POA: Diagnosis not present

## 2018-04-20 DIAGNOSIS — R293 Abnormal posture: Secondary | ICD-10-CM | POA: Diagnosis not present

## 2018-04-20 DIAGNOSIS — R5383 Other fatigue: Secondary | ICD-10-CM | POA: Diagnosis not present

## 2018-04-20 DIAGNOSIS — C678 Malignant neoplasm of overlapping sites of bladder: Secondary | ICD-10-CM | POA: Diagnosis not present

## 2018-04-20 DIAGNOSIS — M6281 Muscle weakness (generalized): Secondary | ICD-10-CM | POA: Diagnosis not present

## 2018-04-22 DIAGNOSIS — M6281 Muscle weakness (generalized): Secondary | ICD-10-CM | POA: Diagnosis not present

## 2018-04-22 DIAGNOSIS — R2681 Unsteadiness on feet: Secondary | ICD-10-CM | POA: Diagnosis not present

## 2018-04-22 DIAGNOSIS — C678 Malignant neoplasm of overlapping sites of bladder: Secondary | ICD-10-CM | POA: Diagnosis not present

## 2018-04-22 DIAGNOSIS — M545 Low back pain: Secondary | ICD-10-CM | POA: Diagnosis not present

## 2018-04-22 DIAGNOSIS — R293 Abnormal posture: Secondary | ICD-10-CM | POA: Diagnosis not present

## 2018-04-22 DIAGNOSIS — R5383 Other fatigue: Secondary | ICD-10-CM | POA: Diagnosis not present

## 2018-04-29 DIAGNOSIS — M6281 Muscle weakness (generalized): Secondary | ICD-10-CM | POA: Diagnosis not present

## 2018-04-29 DIAGNOSIS — C678 Malignant neoplasm of overlapping sites of bladder: Secondary | ICD-10-CM | POA: Diagnosis not present

## 2018-04-29 DIAGNOSIS — R2681 Unsteadiness on feet: Secondary | ICD-10-CM | POA: Diagnosis not present

## 2018-04-29 DIAGNOSIS — M545 Low back pain: Secondary | ICD-10-CM | POA: Diagnosis not present

## 2018-04-29 DIAGNOSIS — R5383 Other fatigue: Secondary | ICD-10-CM | POA: Diagnosis not present

## 2018-04-29 DIAGNOSIS — R293 Abnormal posture: Secondary | ICD-10-CM | POA: Diagnosis not present

## 2018-04-30 DIAGNOSIS — L570 Actinic keratosis: Secondary | ICD-10-CM | POA: Diagnosis not present

## 2018-04-30 DIAGNOSIS — C44629 Squamous cell carcinoma of skin of left upper limb, including shoulder: Secondary | ICD-10-CM | POA: Diagnosis not present

## 2018-04-30 DIAGNOSIS — L821 Other seborrheic keratosis: Secondary | ICD-10-CM | POA: Diagnosis not present

## 2018-04-30 DIAGNOSIS — L578 Other skin changes due to chronic exposure to nonionizing radiation: Secondary | ICD-10-CM | POA: Diagnosis not present

## 2018-05-04 DIAGNOSIS — E663 Overweight: Secondary | ICD-10-CM | POA: Diagnosis not present

## 2018-05-04 DIAGNOSIS — K922 Gastrointestinal hemorrhage, unspecified: Secondary | ICD-10-CM | POA: Diagnosis not present

## 2018-05-04 DIAGNOSIS — Z6829 Body mass index (BMI) 29.0-29.9, adult: Secondary | ICD-10-CM | POA: Diagnosis not present

## 2018-05-04 DIAGNOSIS — R0902 Hypoxemia: Secondary | ICD-10-CM | POA: Diagnosis not present

## 2018-05-05 DIAGNOSIS — R1915 Other abnormal bowel sounds: Secondary | ICD-10-CM | POA: Diagnosis not present

## 2018-05-05 DIAGNOSIS — K921 Melena: Secondary | ICD-10-CM | POA: Diagnosis not present

## 2018-05-06 DIAGNOSIS — M2560 Stiffness of unspecified joint, not elsewhere classified: Secondary | ICD-10-CM | POA: Diagnosis not present

## 2018-05-06 DIAGNOSIS — M545 Low back pain: Secondary | ICD-10-CM | POA: Diagnosis not present

## 2018-05-06 DIAGNOSIS — R2681 Unsteadiness on feet: Secondary | ICD-10-CM | POA: Diagnosis not present

## 2018-05-06 DIAGNOSIS — R2689 Other abnormalities of gait and mobility: Secondary | ICD-10-CM | POA: Diagnosis not present

## 2018-05-06 DIAGNOSIS — M6281 Muscle weakness (generalized): Secondary | ICD-10-CM | POA: Diagnosis not present

## 2018-05-06 DIAGNOSIS — R5383 Other fatigue: Secondary | ICD-10-CM | POA: Diagnosis not present

## 2018-05-06 DIAGNOSIS — C678 Malignant neoplasm of overlapping sites of bladder: Secondary | ICD-10-CM | POA: Diagnosis not present

## 2018-05-06 DIAGNOSIS — R293 Abnormal posture: Secondary | ICD-10-CM | POA: Diagnosis not present

## 2018-05-10 DIAGNOSIS — D500 Iron deficiency anemia secondary to blood loss (chronic): Secondary | ICD-10-CM | POA: Diagnosis not present

## 2018-05-10 DIAGNOSIS — D510 Vitamin B12 deficiency anemia due to intrinsic factor deficiency: Secondary | ICD-10-CM | POA: Diagnosis not present

## 2018-05-12 DIAGNOSIS — M6281 Muscle weakness (generalized): Secondary | ICD-10-CM | POA: Diagnosis not present

## 2018-05-12 DIAGNOSIS — R2681 Unsteadiness on feet: Secondary | ICD-10-CM | POA: Diagnosis not present

## 2018-05-12 DIAGNOSIS — M545 Low back pain: Secondary | ICD-10-CM | POA: Diagnosis not present

## 2018-05-12 DIAGNOSIS — R293 Abnormal posture: Secondary | ICD-10-CM | POA: Diagnosis not present

## 2018-05-12 DIAGNOSIS — C678 Malignant neoplasm of overlapping sites of bladder: Secondary | ICD-10-CM | POA: Diagnosis not present

## 2018-05-12 DIAGNOSIS — R5383 Other fatigue: Secondary | ICD-10-CM | POA: Diagnosis not present

## 2018-05-13 DIAGNOSIS — D509 Iron deficiency anemia, unspecified: Secondary | ICD-10-CM | POA: Diagnosis not present

## 2018-05-13 DIAGNOSIS — K921 Melena: Secondary | ICD-10-CM | POA: Diagnosis not present

## 2018-05-17 DIAGNOSIS — C675 Malignant neoplasm of bladder neck: Secondary | ICD-10-CM | POA: Diagnosis not present

## 2018-05-17 DIAGNOSIS — Z452 Encounter for adjustment and management of vascular access device: Secondary | ICD-10-CM | POA: Diagnosis not present

## 2018-05-18 DIAGNOSIS — R5383 Other fatigue: Secondary | ICD-10-CM | POA: Diagnosis not present

## 2018-05-18 DIAGNOSIS — R2681 Unsteadiness on feet: Secondary | ICD-10-CM | POA: Diagnosis not present

## 2018-05-18 DIAGNOSIS — M6281 Muscle weakness (generalized): Secondary | ICD-10-CM | POA: Diagnosis not present

## 2018-05-18 DIAGNOSIS — M545 Low back pain: Secondary | ICD-10-CM | POA: Diagnosis not present

## 2018-05-18 DIAGNOSIS — C44629 Squamous cell carcinoma of skin of left upper limb, including shoulder: Secondary | ICD-10-CM | POA: Diagnosis not present

## 2018-05-18 DIAGNOSIS — R293 Abnormal posture: Secondary | ICD-10-CM | POA: Diagnosis not present

## 2018-05-18 DIAGNOSIS — L570 Actinic keratosis: Secondary | ICD-10-CM | POA: Diagnosis not present

## 2018-05-18 DIAGNOSIS — C678 Malignant neoplasm of overlapping sites of bladder: Secondary | ICD-10-CM | POA: Diagnosis not present

## 2018-05-25 DIAGNOSIS — M545 Low back pain: Secondary | ICD-10-CM | POA: Diagnosis not present

## 2018-05-25 DIAGNOSIS — C678 Malignant neoplasm of overlapping sites of bladder: Secondary | ICD-10-CM | POA: Diagnosis not present

## 2018-05-25 DIAGNOSIS — R293 Abnormal posture: Secondary | ICD-10-CM | POA: Diagnosis not present

## 2018-05-25 DIAGNOSIS — M6281 Muscle weakness (generalized): Secondary | ICD-10-CM | POA: Diagnosis not present

## 2018-05-25 DIAGNOSIS — R2681 Unsteadiness on feet: Secondary | ICD-10-CM | POA: Diagnosis not present

## 2018-05-25 DIAGNOSIS — R5383 Other fatigue: Secondary | ICD-10-CM | POA: Diagnosis not present

## 2018-05-27 DIAGNOSIS — R2681 Unsteadiness on feet: Secondary | ICD-10-CM | POA: Diagnosis not present

## 2018-05-27 DIAGNOSIS — R293 Abnormal posture: Secondary | ICD-10-CM | POA: Diagnosis not present

## 2018-05-27 DIAGNOSIS — M6281 Muscle weakness (generalized): Secondary | ICD-10-CM | POA: Diagnosis not present

## 2018-05-27 DIAGNOSIS — M545 Low back pain: Secondary | ICD-10-CM | POA: Diagnosis not present

## 2018-05-27 DIAGNOSIS — R5383 Other fatigue: Secondary | ICD-10-CM | POA: Diagnosis not present

## 2018-05-27 DIAGNOSIS — C678 Malignant neoplasm of overlapping sites of bladder: Secondary | ICD-10-CM | POA: Diagnosis not present

## 2018-05-28 DIAGNOSIS — J449 Chronic obstructive pulmonary disease, unspecified: Secondary | ICD-10-CM | POA: Diagnosis not present

## 2018-05-28 DIAGNOSIS — R0902 Hypoxemia: Secondary | ICD-10-CM | POA: Diagnosis not present

## 2018-05-28 DIAGNOSIS — J984 Other disorders of lung: Secondary | ICD-10-CM | POA: Diagnosis not present

## 2018-06-08 DIAGNOSIS — Z79899 Other long term (current) drug therapy: Secondary | ICD-10-CM | POA: Diagnosis not present

## 2018-06-08 DIAGNOSIS — S8012XA Contusion of left lower leg, initial encounter: Secondary | ICD-10-CM | POA: Diagnosis not present

## 2018-06-08 DIAGNOSIS — Z6828 Body mass index (BMI) 28.0-28.9, adult: Secondary | ICD-10-CM | POA: Diagnosis not present

## 2018-06-09 DIAGNOSIS — M2560 Stiffness of unspecified joint, not elsewhere classified: Secondary | ICD-10-CM | POA: Diagnosis not present

## 2018-06-09 DIAGNOSIS — R2689 Other abnormalities of gait and mobility: Secondary | ICD-10-CM | POA: Diagnosis not present

## 2018-06-09 DIAGNOSIS — C678 Malignant neoplasm of overlapping sites of bladder: Secondary | ICD-10-CM | POA: Diagnosis not present

## 2018-06-09 DIAGNOSIS — R293 Abnormal posture: Secondary | ICD-10-CM | POA: Diagnosis not present

## 2018-06-09 DIAGNOSIS — R5383 Other fatigue: Secondary | ICD-10-CM | POA: Diagnosis not present

## 2018-06-09 DIAGNOSIS — N401 Enlarged prostate with lower urinary tract symptoms: Secondary | ICD-10-CM | POA: Diagnosis not present

## 2018-06-09 DIAGNOSIS — C672 Malignant neoplasm of lateral wall of bladder: Secondary | ICD-10-CM | POA: Diagnosis not present

## 2018-06-09 DIAGNOSIS — M6281 Muscle weakness (generalized): Secondary | ICD-10-CM | POA: Diagnosis not present

## 2018-06-09 DIAGNOSIS — R2681 Unsteadiness on feet: Secondary | ICD-10-CM | POA: Diagnosis not present

## 2018-06-09 DIAGNOSIS — M545 Low back pain: Secondary | ICD-10-CM | POA: Diagnosis not present

## 2018-06-09 DIAGNOSIS — M7989 Other specified soft tissue disorders: Secondary | ICD-10-CM | POA: Diagnosis not present

## 2018-06-10 DIAGNOSIS — R5383 Other fatigue: Secondary | ICD-10-CM | POA: Diagnosis not present

## 2018-06-10 DIAGNOSIS — R293 Abnormal posture: Secondary | ICD-10-CM | POA: Diagnosis not present

## 2018-06-10 DIAGNOSIS — C678 Malignant neoplasm of overlapping sites of bladder: Secondary | ICD-10-CM | POA: Diagnosis not present

## 2018-06-10 DIAGNOSIS — M6281 Muscle weakness (generalized): Secondary | ICD-10-CM | POA: Diagnosis not present

## 2018-06-10 DIAGNOSIS — M545 Low back pain: Secondary | ICD-10-CM | POA: Diagnosis not present

## 2018-06-10 DIAGNOSIS — R2681 Unsteadiness on feet: Secondary | ICD-10-CM | POA: Diagnosis not present

## 2018-06-15 DIAGNOSIS — M545 Low back pain: Secondary | ICD-10-CM | POA: Diagnosis not present

## 2018-06-15 DIAGNOSIS — M6281 Muscle weakness (generalized): Secondary | ICD-10-CM | POA: Diagnosis not present

## 2018-06-15 DIAGNOSIS — R293 Abnormal posture: Secondary | ICD-10-CM | POA: Diagnosis not present

## 2018-06-15 DIAGNOSIS — R5383 Other fatigue: Secondary | ICD-10-CM | POA: Diagnosis not present

## 2018-06-15 DIAGNOSIS — C678 Malignant neoplasm of overlapping sites of bladder: Secondary | ICD-10-CM | POA: Diagnosis not present

## 2018-06-15 DIAGNOSIS — R2681 Unsteadiness on feet: Secondary | ICD-10-CM | POA: Diagnosis not present

## 2018-06-16 DIAGNOSIS — Z0000 Encounter for general adult medical examination without abnormal findings: Secondary | ICD-10-CM | POA: Diagnosis not present

## 2018-06-16 DIAGNOSIS — E785 Hyperlipidemia, unspecified: Secondary | ICD-10-CM | POA: Diagnosis not present

## 2018-06-16 DIAGNOSIS — Z139 Encounter for screening, unspecified: Secondary | ICD-10-CM | POA: Diagnosis not present

## 2018-06-16 DIAGNOSIS — Z9181 History of falling: Secondary | ICD-10-CM | POA: Diagnosis not present

## 2018-06-16 DIAGNOSIS — Z125 Encounter for screening for malignant neoplasm of prostate: Secondary | ICD-10-CM | POA: Diagnosis not present

## 2018-06-16 DIAGNOSIS — H401121 Primary open-angle glaucoma, left eye, mild stage: Secondary | ICD-10-CM | POA: Diagnosis not present

## 2018-06-16 DIAGNOSIS — H401113 Primary open-angle glaucoma, right eye, severe stage: Secondary | ICD-10-CM | POA: Diagnosis not present

## 2018-06-17 DIAGNOSIS — M6281 Muscle weakness (generalized): Secondary | ICD-10-CM | POA: Diagnosis not present

## 2018-06-17 DIAGNOSIS — M545 Low back pain: Secondary | ICD-10-CM | POA: Diagnosis not present

## 2018-06-17 DIAGNOSIS — R293 Abnormal posture: Secondary | ICD-10-CM | POA: Diagnosis not present

## 2018-06-17 DIAGNOSIS — M1712 Unilateral primary osteoarthritis, left knee: Secondary | ICD-10-CM | POA: Diagnosis not present

## 2018-06-17 DIAGNOSIS — R5383 Other fatigue: Secondary | ICD-10-CM | POA: Diagnosis not present

## 2018-06-17 DIAGNOSIS — C678 Malignant neoplasm of overlapping sites of bladder: Secondary | ICD-10-CM | POA: Diagnosis not present

## 2018-06-17 DIAGNOSIS — R2681 Unsteadiness on feet: Secondary | ICD-10-CM | POA: Diagnosis not present

## 2018-06-22 DIAGNOSIS — M6281 Muscle weakness (generalized): Secondary | ICD-10-CM | POA: Diagnosis not present

## 2018-06-22 DIAGNOSIS — R2681 Unsteadiness on feet: Secondary | ICD-10-CM | POA: Diagnosis not present

## 2018-06-22 DIAGNOSIS — M545 Low back pain: Secondary | ICD-10-CM | POA: Diagnosis not present

## 2018-06-22 DIAGNOSIS — C678 Malignant neoplasm of overlapping sites of bladder: Secondary | ICD-10-CM | POA: Diagnosis not present

## 2018-06-22 DIAGNOSIS — R5383 Other fatigue: Secondary | ICD-10-CM | POA: Diagnosis not present

## 2018-06-22 DIAGNOSIS — R293 Abnormal posture: Secondary | ICD-10-CM | POA: Diagnosis not present

## 2018-06-24 DIAGNOSIS — R2681 Unsteadiness on feet: Secondary | ICD-10-CM | POA: Diagnosis not present

## 2018-06-24 DIAGNOSIS — M545 Low back pain: Secondary | ICD-10-CM | POA: Diagnosis not present

## 2018-06-24 DIAGNOSIS — C678 Malignant neoplasm of overlapping sites of bladder: Secondary | ICD-10-CM | POA: Diagnosis not present

## 2018-06-24 DIAGNOSIS — M6281 Muscle weakness (generalized): Secondary | ICD-10-CM | POA: Diagnosis not present

## 2018-06-24 DIAGNOSIS — R5383 Other fatigue: Secondary | ICD-10-CM | POA: Diagnosis not present

## 2018-06-24 DIAGNOSIS — R293 Abnormal posture: Secondary | ICD-10-CM | POA: Diagnosis not present

## 2018-06-29 DIAGNOSIS — M6281 Muscle weakness (generalized): Secondary | ICD-10-CM | POA: Diagnosis not present

## 2018-06-29 DIAGNOSIS — C678 Malignant neoplasm of overlapping sites of bladder: Secondary | ICD-10-CM | POA: Diagnosis not present

## 2018-06-29 DIAGNOSIS — M545 Low back pain: Secondary | ICD-10-CM | POA: Diagnosis not present

## 2018-06-29 DIAGNOSIS — R293 Abnormal posture: Secondary | ICD-10-CM | POA: Diagnosis not present

## 2018-06-29 DIAGNOSIS — R5383 Other fatigue: Secondary | ICD-10-CM | POA: Diagnosis not present

## 2018-06-29 DIAGNOSIS — R2681 Unsteadiness on feet: Secondary | ICD-10-CM | POA: Diagnosis not present

## 2018-07-01 DIAGNOSIS — R293 Abnormal posture: Secondary | ICD-10-CM | POA: Diagnosis not present

## 2018-07-01 DIAGNOSIS — M2560 Stiffness of unspecified joint, not elsewhere classified: Secondary | ICD-10-CM | POA: Diagnosis not present

## 2018-07-01 DIAGNOSIS — C678 Malignant neoplasm of overlapping sites of bladder: Secondary | ICD-10-CM | POA: Diagnosis not present

## 2018-07-01 DIAGNOSIS — R2689 Other abnormalities of gait and mobility: Secondary | ICD-10-CM | POA: Diagnosis not present

## 2018-07-01 DIAGNOSIS — R2681 Unsteadiness on feet: Secondary | ICD-10-CM | POA: Diagnosis not present

## 2018-07-01 DIAGNOSIS — M6281 Muscle weakness (generalized): Secondary | ICD-10-CM | POA: Diagnosis not present

## 2018-07-01 DIAGNOSIS — M545 Low back pain: Secondary | ICD-10-CM | POA: Diagnosis not present

## 2018-07-01 DIAGNOSIS — R5383 Other fatigue: Secondary | ICD-10-CM | POA: Diagnosis not present

## 2018-07-06 DIAGNOSIS — R5383 Other fatigue: Secondary | ICD-10-CM | POA: Diagnosis not present

## 2018-07-06 DIAGNOSIS — R293 Abnormal posture: Secondary | ICD-10-CM | POA: Diagnosis not present

## 2018-07-06 DIAGNOSIS — M545 Low back pain: Secondary | ICD-10-CM | POA: Diagnosis not present

## 2018-07-06 DIAGNOSIS — M6281 Muscle weakness (generalized): Secondary | ICD-10-CM | POA: Diagnosis not present

## 2018-07-06 DIAGNOSIS — C678 Malignant neoplasm of overlapping sites of bladder: Secondary | ICD-10-CM | POA: Diagnosis not present

## 2018-07-06 DIAGNOSIS — R2681 Unsteadiness on feet: Secondary | ICD-10-CM | POA: Diagnosis not present

## 2018-07-07 DIAGNOSIS — K5900 Constipation, unspecified: Secondary | ICD-10-CM | POA: Diagnosis not present

## 2018-07-07 DIAGNOSIS — K921 Melena: Secondary | ICD-10-CM | POA: Diagnosis not present

## 2018-07-08 DIAGNOSIS — R293 Abnormal posture: Secondary | ICD-10-CM | POA: Diagnosis not present

## 2018-07-08 DIAGNOSIS — M545 Low back pain: Secondary | ICD-10-CM | POA: Diagnosis not present

## 2018-07-08 DIAGNOSIS — M6281 Muscle weakness (generalized): Secondary | ICD-10-CM | POA: Diagnosis not present

## 2018-07-08 DIAGNOSIS — R2681 Unsteadiness on feet: Secondary | ICD-10-CM | POA: Diagnosis not present

## 2018-07-08 DIAGNOSIS — R5383 Other fatigue: Secondary | ICD-10-CM | POA: Diagnosis not present

## 2018-07-08 DIAGNOSIS — C678 Malignant neoplasm of overlapping sites of bladder: Secondary | ICD-10-CM | POA: Diagnosis not present

## 2018-07-13 DIAGNOSIS — M545 Low back pain: Secondary | ICD-10-CM | POA: Diagnosis not present

## 2018-07-13 DIAGNOSIS — R293 Abnormal posture: Secondary | ICD-10-CM | POA: Diagnosis not present

## 2018-07-13 DIAGNOSIS — C678 Malignant neoplasm of overlapping sites of bladder: Secondary | ICD-10-CM | POA: Diagnosis not present

## 2018-07-13 DIAGNOSIS — R2681 Unsteadiness on feet: Secondary | ICD-10-CM | POA: Diagnosis not present

## 2018-07-13 DIAGNOSIS — M6281 Muscle weakness (generalized): Secondary | ICD-10-CM | POA: Diagnosis not present

## 2018-07-13 DIAGNOSIS — R5383 Other fatigue: Secondary | ICD-10-CM | POA: Diagnosis not present

## 2018-07-15 DIAGNOSIS — C678 Malignant neoplasm of overlapping sites of bladder: Secondary | ICD-10-CM | POA: Diagnosis not present

## 2018-07-15 DIAGNOSIS — M545 Low back pain: Secondary | ICD-10-CM | POA: Diagnosis not present

## 2018-07-15 DIAGNOSIS — M6281 Muscle weakness (generalized): Secondary | ICD-10-CM | POA: Diagnosis not present

## 2018-07-15 DIAGNOSIS — R2681 Unsteadiness on feet: Secondary | ICD-10-CM | POA: Diagnosis not present

## 2018-07-15 DIAGNOSIS — R293 Abnormal posture: Secondary | ICD-10-CM | POA: Diagnosis not present

## 2018-07-15 DIAGNOSIS — R5383 Other fatigue: Secondary | ICD-10-CM | POA: Diagnosis not present

## 2018-07-20 DIAGNOSIS — C678 Malignant neoplasm of overlapping sites of bladder: Secondary | ICD-10-CM | POA: Diagnosis not present

## 2018-07-20 DIAGNOSIS — R2681 Unsteadiness on feet: Secondary | ICD-10-CM | POA: Diagnosis not present

## 2018-07-20 DIAGNOSIS — R293 Abnormal posture: Secondary | ICD-10-CM | POA: Diagnosis not present

## 2018-07-20 DIAGNOSIS — M6281 Muscle weakness (generalized): Secondary | ICD-10-CM | POA: Diagnosis not present

## 2018-07-20 DIAGNOSIS — M545 Low back pain: Secondary | ICD-10-CM | POA: Diagnosis not present

## 2018-07-20 DIAGNOSIS — R5383 Other fatigue: Secondary | ICD-10-CM | POA: Diagnosis not present

## 2018-07-22 DIAGNOSIS — M6281 Muscle weakness (generalized): Secondary | ICD-10-CM | POA: Diagnosis not present

## 2018-07-22 DIAGNOSIS — C678 Malignant neoplasm of overlapping sites of bladder: Secondary | ICD-10-CM | POA: Diagnosis not present

## 2018-07-22 DIAGNOSIS — M545 Low back pain: Secondary | ICD-10-CM | POA: Diagnosis not present

## 2018-07-22 DIAGNOSIS — R5383 Other fatigue: Secondary | ICD-10-CM | POA: Diagnosis not present

## 2018-07-22 DIAGNOSIS — R293 Abnormal posture: Secondary | ICD-10-CM | POA: Diagnosis not present

## 2018-07-22 DIAGNOSIS — R2681 Unsteadiness on feet: Secondary | ICD-10-CM | POA: Diagnosis not present

## 2018-07-27 DIAGNOSIS — R5383 Other fatigue: Secondary | ICD-10-CM | POA: Diagnosis not present

## 2018-07-27 DIAGNOSIS — C678 Malignant neoplasm of overlapping sites of bladder: Secondary | ICD-10-CM | POA: Diagnosis not present

## 2018-07-27 DIAGNOSIS — M545 Low back pain: Secondary | ICD-10-CM | POA: Diagnosis not present

## 2018-07-27 DIAGNOSIS — R2681 Unsteadiness on feet: Secondary | ICD-10-CM | POA: Diagnosis not present

## 2018-07-27 DIAGNOSIS — M6281 Muscle weakness (generalized): Secondary | ICD-10-CM | POA: Diagnosis not present

## 2018-07-27 DIAGNOSIS — R293 Abnormal posture: Secondary | ICD-10-CM | POA: Diagnosis not present

## 2018-08-10 DIAGNOSIS — R293 Abnormal posture: Secondary | ICD-10-CM | POA: Diagnosis not present

## 2018-08-10 DIAGNOSIS — M545 Low back pain: Secondary | ICD-10-CM | POA: Diagnosis not present

## 2018-08-10 DIAGNOSIS — M2560 Stiffness of unspecified joint, not elsewhere classified: Secondary | ICD-10-CM | POA: Diagnosis not present

## 2018-08-10 DIAGNOSIS — M6281 Muscle weakness (generalized): Secondary | ICD-10-CM | POA: Diagnosis not present

## 2018-08-10 DIAGNOSIS — C678 Malignant neoplasm of overlapping sites of bladder: Secondary | ICD-10-CM | POA: Diagnosis not present

## 2018-08-10 DIAGNOSIS — R5383 Other fatigue: Secondary | ICD-10-CM | POA: Diagnosis not present

## 2018-08-10 DIAGNOSIS — R2681 Unsteadiness on feet: Secondary | ICD-10-CM | POA: Diagnosis not present

## 2018-08-10 DIAGNOSIS — R2689 Other abnormalities of gait and mobility: Secondary | ICD-10-CM | POA: Diagnosis not present

## 2018-08-12 DIAGNOSIS — C675 Malignant neoplasm of bladder neck: Secondary | ICD-10-CM | POA: Diagnosis not present

## 2018-08-13 DIAGNOSIS — Z923 Personal history of irradiation: Secondary | ICD-10-CM | POA: Diagnosis not present

## 2018-08-13 DIAGNOSIS — Z8551 Personal history of malignant neoplasm of bladder: Secondary | ICD-10-CM | POA: Diagnosis not present

## 2018-08-13 DIAGNOSIS — Z9221 Personal history of antineoplastic chemotherapy: Secondary | ICD-10-CM | POA: Diagnosis not present

## 2018-08-13 DIAGNOSIS — C675 Malignant neoplasm of bladder neck: Secondary | ICD-10-CM | POA: Diagnosis not present

## 2018-08-18 DIAGNOSIS — R293 Abnormal posture: Secondary | ICD-10-CM | POA: Diagnosis not present

## 2018-08-18 DIAGNOSIS — R2681 Unsteadiness on feet: Secondary | ICD-10-CM | POA: Diagnosis not present

## 2018-08-18 DIAGNOSIS — M545 Low back pain: Secondary | ICD-10-CM | POA: Diagnosis not present

## 2018-08-18 DIAGNOSIS — C678 Malignant neoplasm of overlapping sites of bladder: Secondary | ICD-10-CM | POA: Diagnosis not present

## 2018-08-18 DIAGNOSIS — C44629 Squamous cell carcinoma of skin of left upper limb, including shoulder: Secondary | ICD-10-CM | POA: Diagnosis not present

## 2018-08-18 DIAGNOSIS — H401113 Primary open-angle glaucoma, right eye, severe stage: Secondary | ICD-10-CM | POA: Diagnosis not present

## 2018-08-18 DIAGNOSIS — R5383 Other fatigue: Secondary | ICD-10-CM | POA: Diagnosis not present

## 2018-08-18 DIAGNOSIS — M6281 Muscle weakness (generalized): Secondary | ICD-10-CM | POA: Diagnosis not present

## 2018-08-18 DIAGNOSIS — R233 Spontaneous ecchymoses: Secondary | ICD-10-CM | POA: Diagnosis not present

## 2018-08-18 DIAGNOSIS — L570 Actinic keratosis: Secondary | ICD-10-CM | POA: Diagnosis not present

## 2018-08-24 DIAGNOSIS — R293 Abnormal posture: Secondary | ICD-10-CM | POA: Diagnosis not present

## 2018-08-24 DIAGNOSIS — M6281 Muscle weakness (generalized): Secondary | ICD-10-CM | POA: Diagnosis not present

## 2018-08-24 DIAGNOSIS — R2681 Unsteadiness on feet: Secondary | ICD-10-CM | POA: Diagnosis not present

## 2018-08-24 DIAGNOSIS — C678 Malignant neoplasm of overlapping sites of bladder: Secondary | ICD-10-CM | POA: Diagnosis not present

## 2018-08-24 DIAGNOSIS — R5383 Other fatigue: Secondary | ICD-10-CM | POA: Diagnosis not present

## 2018-08-24 DIAGNOSIS — M545 Low back pain: Secondary | ICD-10-CM | POA: Diagnosis not present

## 2018-08-26 DIAGNOSIS — M545 Low back pain: Secondary | ICD-10-CM | POA: Diagnosis not present

## 2018-08-26 DIAGNOSIS — R5383 Other fatigue: Secondary | ICD-10-CM | POA: Diagnosis not present

## 2018-08-26 DIAGNOSIS — R293 Abnormal posture: Secondary | ICD-10-CM | POA: Diagnosis not present

## 2018-08-26 DIAGNOSIS — R2681 Unsteadiness on feet: Secondary | ICD-10-CM | POA: Diagnosis not present

## 2018-08-26 DIAGNOSIS — C678 Malignant neoplasm of overlapping sites of bladder: Secondary | ICD-10-CM | POA: Diagnosis not present

## 2018-08-26 DIAGNOSIS — M6281 Muscle weakness (generalized): Secondary | ICD-10-CM | POA: Diagnosis not present

## 2018-08-27 DIAGNOSIS — M25551 Pain in right hip: Secondary | ICD-10-CM | POA: Diagnosis not present

## 2018-08-27 DIAGNOSIS — E039 Hypothyroidism, unspecified: Secondary | ICD-10-CM

## 2018-08-27 DIAGNOSIS — Z951 Presence of aortocoronary bypass graft: Secondary | ICD-10-CM | POA: Diagnosis not present

## 2018-08-27 DIAGNOSIS — Z7901 Long term (current) use of anticoagulants: Secondary | ICD-10-CM | POA: Diagnosis not present

## 2018-08-27 DIAGNOSIS — W19XXXA Unspecified fall, initial encounter: Secondary | ICD-10-CM | POA: Diagnosis not present

## 2018-08-27 DIAGNOSIS — I2510 Atherosclerotic heart disease of native coronary artery without angina pectoris: Secondary | ICD-10-CM | POA: Diagnosis not present

## 2018-08-27 DIAGNOSIS — S79922A Unspecified injury of left thigh, initial encounter: Secondary | ICD-10-CM | POA: Diagnosis not present

## 2018-08-27 DIAGNOSIS — S8992XA Unspecified injury of left lower leg, initial encounter: Secondary | ICD-10-CM | POA: Diagnosis not present

## 2018-08-27 DIAGNOSIS — S79912A Unspecified injury of left hip, initial encounter: Secondary | ICD-10-CM | POA: Diagnosis not present

## 2018-08-27 DIAGNOSIS — S32009A Unspecified fracture of unspecified lumbar vertebra, initial encounter for closed fracture: Secondary | ICD-10-CM | POA: Diagnosis not present

## 2018-08-27 DIAGNOSIS — S32402A Unspecified fracture of left acetabulum, initial encounter for closed fracture: Secondary | ICD-10-CM | POA: Diagnosis not present

## 2018-08-27 DIAGNOSIS — S3991XA Unspecified injury of abdomen, initial encounter: Secondary | ICD-10-CM | POA: Diagnosis not present

## 2018-08-27 DIAGNOSIS — M549 Dorsalgia, unspecified: Secondary | ICD-10-CM | POA: Diagnosis not present

## 2018-08-27 DIAGNOSIS — S199XXA Unspecified injury of neck, initial encounter: Secondary | ICD-10-CM | POA: Diagnosis not present

## 2018-08-27 DIAGNOSIS — Z7951 Long term (current) use of inhaled steroids: Secondary | ICD-10-CM | POA: Diagnosis not present

## 2018-08-27 DIAGNOSIS — S0990XA Unspecified injury of head, initial encounter: Secondary | ICD-10-CM | POA: Diagnosis not present

## 2018-08-27 DIAGNOSIS — Z23 Encounter for immunization: Secondary | ICD-10-CM | POA: Diagnosis not present

## 2018-08-27 DIAGNOSIS — S79911A Unspecified injury of right hip, initial encounter: Secondary | ICD-10-CM | POA: Diagnosis not present

## 2018-08-27 DIAGNOSIS — Z79899 Other long term (current) drug therapy: Secondary | ICD-10-CM | POA: Diagnosis not present

## 2018-08-27 DIAGNOSIS — J449 Chronic obstructive pulmonary disease, unspecified: Secondary | ICD-10-CM

## 2018-08-27 DIAGNOSIS — S299XXA Unspecified injury of thorax, initial encounter: Secondary | ICD-10-CM | POA: Diagnosis not present

## 2018-08-27 DIAGNOSIS — S0003XA Contusion of scalp, initial encounter: Secondary | ICD-10-CM | POA: Diagnosis not present

## 2018-08-27 DIAGNOSIS — S3992XA Unspecified injury of lower back, initial encounter: Secondary | ICD-10-CM | POA: Diagnosis not present

## 2018-08-27 DIAGNOSIS — I4891 Unspecified atrial fibrillation: Secondary | ICD-10-CM

## 2018-08-27 DIAGNOSIS — R52 Pain, unspecified: Secondary | ICD-10-CM | POA: Diagnosis not present

## 2018-08-27 DIAGNOSIS — M542 Cervicalgia: Secondary | ICD-10-CM | POA: Diagnosis not present

## 2018-08-27 DIAGNOSIS — K219 Gastro-esophageal reflux disease without esophagitis: Secondary | ICD-10-CM | POA: Diagnosis not present

## 2018-08-27 DIAGNOSIS — S32059A Unspecified fracture of fifth lumbar vertebra, initial encounter for closed fracture: Secondary | ICD-10-CM | POA: Diagnosis not present

## 2018-08-27 DIAGNOSIS — S32415A Nondisplaced fracture of anterior wall of left acetabulum, initial encounter for closed fracture: Secondary | ICD-10-CM | POA: Diagnosis not present

## 2018-08-27 DIAGNOSIS — E119 Type 2 diabetes mellitus without complications: Secondary | ICD-10-CM

## 2018-08-27 DIAGNOSIS — S32302A Unspecified fracture of left ilium, initial encounter for closed fracture: Secondary | ICD-10-CM | POA: Diagnosis not present

## 2018-08-28 DIAGNOSIS — S32009A Unspecified fracture of unspecified lumbar vertebra, initial encounter for closed fracture: Secondary | ICD-10-CM | POA: Diagnosis not present

## 2018-08-28 DIAGNOSIS — S0003XA Contusion of scalp, initial encounter: Secondary | ICD-10-CM | POA: Diagnosis not present

## 2018-08-28 DIAGNOSIS — S32302A Unspecified fracture of left ilium, initial encounter for closed fracture: Secondary | ICD-10-CM | POA: Diagnosis not present

## 2018-08-28 DIAGNOSIS — W19XXXA Unspecified fall, initial encounter: Secondary | ICD-10-CM | POA: Diagnosis not present

## 2018-08-28 DIAGNOSIS — S32402A Unspecified fracture of left acetabulum, initial encounter for closed fracture: Secondary | ICD-10-CM | POA: Diagnosis not present

## 2018-08-29 DIAGNOSIS — S32302A Unspecified fracture of left ilium, initial encounter for closed fracture: Secondary | ICD-10-CM | POA: Diagnosis not present

## 2018-08-29 DIAGNOSIS — S0003XA Contusion of scalp, initial encounter: Secondary | ICD-10-CM | POA: Diagnosis not present

## 2018-08-29 DIAGNOSIS — S32402A Unspecified fracture of left acetabulum, initial encounter for closed fracture: Secondary | ICD-10-CM | POA: Diagnosis not present

## 2018-08-29 DIAGNOSIS — W19XXXA Unspecified fall, initial encounter: Secondary | ICD-10-CM | POA: Diagnosis not present

## 2018-08-29 DIAGNOSIS — S32009A Unspecified fracture of unspecified lumbar vertebra, initial encounter for closed fracture: Secondary | ICD-10-CM | POA: Diagnosis not present

## 2018-08-30 DIAGNOSIS — S32402A Unspecified fracture of left acetabulum, initial encounter for closed fracture: Secondary | ICD-10-CM | POA: Diagnosis not present

## 2018-08-30 DIAGNOSIS — S0003XA Contusion of scalp, initial encounter: Secondary | ICD-10-CM | POA: Diagnosis not present

## 2018-08-30 DIAGNOSIS — S32302A Unspecified fracture of left ilium, initial encounter for closed fracture: Secondary | ICD-10-CM | POA: Diagnosis not present

## 2018-08-30 DIAGNOSIS — W19XXXA Unspecified fall, initial encounter: Secondary | ICD-10-CM | POA: Diagnosis not present

## 2018-08-30 DIAGNOSIS — S32009A Unspecified fracture of unspecified lumbar vertebra, initial encounter for closed fracture: Secondary | ICD-10-CM | POA: Diagnosis not present

## 2018-08-31 DIAGNOSIS — W19XXXA Unspecified fall, initial encounter: Secondary | ICD-10-CM | POA: Diagnosis not present

## 2018-08-31 DIAGNOSIS — R531 Weakness: Secondary | ICD-10-CM | POA: Diagnosis not present

## 2018-08-31 DIAGNOSIS — S32009A Unspecified fracture of unspecified lumbar vertebra, initial encounter for closed fracture: Secondary | ICD-10-CM | POA: Diagnosis not present

## 2018-08-31 DIAGNOSIS — S32302A Unspecified fracture of left ilium, initial encounter for closed fracture: Secondary | ICD-10-CM | POA: Diagnosis not present

## 2018-08-31 DIAGNOSIS — S32402A Unspecified fracture of left acetabulum, initial encounter for closed fracture: Secondary | ICD-10-CM | POA: Diagnosis not present

## 2018-08-31 DIAGNOSIS — S0003XA Contusion of scalp, initial encounter: Secondary | ICD-10-CM | POA: Diagnosis not present

## 2018-08-31 DIAGNOSIS — Z743 Need for continuous supervision: Secondary | ICD-10-CM | POA: Diagnosis not present

## 2018-08-31 DIAGNOSIS — R279 Unspecified lack of coordination: Secondary | ICD-10-CM | POA: Diagnosis not present

## 2018-09-01 DIAGNOSIS — S32001D Stable burst fracture of unspecified lumbar vertebra, subsequent encounter for fracture with routine healing: Secondary | ICD-10-CM | POA: Diagnosis not present

## 2018-09-01 DIAGNOSIS — R2689 Other abnormalities of gait and mobility: Secondary | ICD-10-CM | POA: Diagnosis not present

## 2018-09-01 DIAGNOSIS — Z741 Need for assistance with personal care: Secondary | ICD-10-CM | POA: Diagnosis not present

## 2018-09-01 DIAGNOSIS — S32302D Unspecified fracture of left ilium, subsequent encounter for fracture with routine healing: Secondary | ICD-10-CM | POA: Diagnosis not present

## 2018-09-02 DIAGNOSIS — R262 Difficulty in walking, not elsewhere classified: Secondary | ICD-10-CM | POA: Diagnosis not present

## 2018-09-02 DIAGNOSIS — S32001D Stable burst fracture of unspecified lumbar vertebra, subsequent encounter for fracture with routine healing: Secondary | ICD-10-CM | POA: Diagnosis not present

## 2018-09-02 DIAGNOSIS — Z741 Need for assistance with personal care: Secondary | ICD-10-CM | POA: Diagnosis not present

## 2018-09-02 DIAGNOSIS — S32302D Unspecified fracture of left ilium, subsequent encounter for fracture with routine healing: Secondary | ICD-10-CM | POA: Diagnosis not present

## 2018-09-02 DIAGNOSIS — S32402D Unspecified fracture of left acetabulum, subsequent encounter for fracture with routine healing: Secondary | ICD-10-CM | POA: Diagnosis not present

## 2018-09-02 DIAGNOSIS — M439 Deforming dorsopathy, unspecified: Secondary | ICD-10-CM | POA: Diagnosis not present

## 2018-09-02 DIAGNOSIS — I4891 Unspecified atrial fibrillation: Secondary | ICD-10-CM | POA: Diagnosis not present

## 2018-09-02 DIAGNOSIS — R2689 Other abnormalities of gait and mobility: Secondary | ICD-10-CM | POA: Diagnosis not present

## 2018-09-03 DIAGNOSIS — R2689 Other abnormalities of gait and mobility: Secondary | ICD-10-CM | POA: Diagnosis not present

## 2018-09-03 DIAGNOSIS — S32302D Unspecified fracture of left ilium, subsequent encounter for fracture with routine healing: Secondary | ICD-10-CM | POA: Diagnosis not present

## 2018-09-03 DIAGNOSIS — S32001D Stable burst fracture of unspecified lumbar vertebra, subsequent encounter for fracture with routine healing: Secondary | ICD-10-CM | POA: Diagnosis not present

## 2018-09-03 DIAGNOSIS — Z741 Need for assistance with personal care: Secondary | ICD-10-CM | POA: Diagnosis not present

## 2018-09-05 DIAGNOSIS — R2689 Other abnormalities of gait and mobility: Secondary | ICD-10-CM | POA: Diagnosis not present

## 2018-09-05 DIAGNOSIS — S32001D Stable burst fracture of unspecified lumbar vertebra, subsequent encounter for fracture with routine healing: Secondary | ICD-10-CM | POA: Diagnosis not present

## 2018-09-05 DIAGNOSIS — S32302D Unspecified fracture of left ilium, subsequent encounter for fracture with routine healing: Secondary | ICD-10-CM | POA: Diagnosis not present

## 2018-09-05 DIAGNOSIS — Z741 Need for assistance with personal care: Secondary | ICD-10-CM | POA: Diagnosis not present

## 2018-09-06 DIAGNOSIS — Z741 Need for assistance with personal care: Secondary | ICD-10-CM | POA: Diagnosis not present

## 2018-09-06 DIAGNOSIS — S32302D Unspecified fracture of left ilium, subsequent encounter for fracture with routine healing: Secondary | ICD-10-CM | POA: Diagnosis not present

## 2018-09-06 DIAGNOSIS — S32001D Stable burst fracture of unspecified lumbar vertebra, subsequent encounter for fracture with routine healing: Secondary | ICD-10-CM | POA: Diagnosis not present

## 2018-09-06 DIAGNOSIS — R2689 Other abnormalities of gait and mobility: Secondary | ICD-10-CM | POA: Diagnosis not present

## 2018-09-07 DIAGNOSIS — R2689 Other abnormalities of gait and mobility: Secondary | ICD-10-CM | POA: Diagnosis not present

## 2018-09-07 DIAGNOSIS — S32001D Stable burst fracture of unspecified lumbar vertebra, subsequent encounter for fracture with routine healing: Secondary | ICD-10-CM | POA: Diagnosis not present

## 2018-09-07 DIAGNOSIS — Z741 Need for assistance with personal care: Secondary | ICD-10-CM | POA: Diagnosis not present

## 2018-09-07 DIAGNOSIS — S32302D Unspecified fracture of left ilium, subsequent encounter for fracture with routine healing: Secondary | ICD-10-CM | POA: Diagnosis not present

## 2018-09-08 DIAGNOSIS — S32302D Unspecified fracture of left ilium, subsequent encounter for fracture with routine healing: Secondary | ICD-10-CM | POA: Diagnosis not present

## 2018-09-08 DIAGNOSIS — S32001D Stable burst fracture of unspecified lumbar vertebra, subsequent encounter for fracture with routine healing: Secondary | ICD-10-CM | POA: Diagnosis not present

## 2018-09-08 DIAGNOSIS — Z741 Need for assistance with personal care: Secondary | ICD-10-CM | POA: Diagnosis not present

## 2018-09-08 DIAGNOSIS — R2689 Other abnormalities of gait and mobility: Secondary | ICD-10-CM | POA: Diagnosis not present

## 2018-09-09 DIAGNOSIS — Z741 Need for assistance with personal care: Secondary | ICD-10-CM | POA: Diagnosis not present

## 2018-09-09 DIAGNOSIS — R2689 Other abnormalities of gait and mobility: Secondary | ICD-10-CM | POA: Diagnosis not present

## 2018-09-09 DIAGNOSIS — S32001D Stable burst fracture of unspecified lumbar vertebra, subsequent encounter for fracture with routine healing: Secondary | ICD-10-CM | POA: Diagnosis not present

## 2018-09-09 DIAGNOSIS — S32302D Unspecified fracture of left ilium, subsequent encounter for fracture with routine healing: Secondary | ICD-10-CM | POA: Diagnosis not present

## 2018-09-10 DIAGNOSIS — R2689 Other abnormalities of gait and mobility: Secondary | ICD-10-CM | POA: Diagnosis not present

## 2018-09-10 DIAGNOSIS — Z741 Need for assistance with personal care: Secondary | ICD-10-CM | POA: Diagnosis not present

## 2018-09-10 DIAGNOSIS — S32302D Unspecified fracture of left ilium, subsequent encounter for fracture with routine healing: Secondary | ICD-10-CM | POA: Diagnosis not present

## 2018-09-10 DIAGNOSIS — S32001D Stable burst fracture of unspecified lumbar vertebra, subsequent encounter for fracture with routine healing: Secondary | ICD-10-CM | POA: Diagnosis not present

## 2018-09-12 DIAGNOSIS — S32302D Unspecified fracture of left ilium, subsequent encounter for fracture with routine healing: Secondary | ICD-10-CM | POA: Diagnosis not present

## 2018-09-12 DIAGNOSIS — S32001D Stable burst fracture of unspecified lumbar vertebra, subsequent encounter for fracture with routine healing: Secondary | ICD-10-CM | POA: Diagnosis not present

## 2018-09-12 DIAGNOSIS — Z741 Need for assistance with personal care: Secondary | ICD-10-CM | POA: Diagnosis not present

## 2018-09-12 DIAGNOSIS — R2689 Other abnormalities of gait and mobility: Secondary | ICD-10-CM | POA: Diagnosis not present

## 2018-09-13 DIAGNOSIS — S32001D Stable burst fracture of unspecified lumbar vertebra, subsequent encounter for fracture with routine healing: Secondary | ICD-10-CM | POA: Diagnosis not present

## 2018-09-13 DIAGNOSIS — S32302D Unspecified fracture of left ilium, subsequent encounter for fracture with routine healing: Secondary | ICD-10-CM | POA: Diagnosis not present

## 2018-09-13 DIAGNOSIS — Z741 Need for assistance with personal care: Secondary | ICD-10-CM | POA: Diagnosis not present

## 2018-09-13 DIAGNOSIS — R2689 Other abnormalities of gait and mobility: Secondary | ICD-10-CM | POA: Diagnosis not present

## 2018-09-14 DIAGNOSIS — S32302D Unspecified fracture of left ilium, subsequent encounter for fracture with routine healing: Secondary | ICD-10-CM | POA: Diagnosis not present

## 2018-09-14 DIAGNOSIS — S32001D Stable burst fracture of unspecified lumbar vertebra, subsequent encounter for fracture with routine healing: Secondary | ICD-10-CM | POA: Diagnosis not present

## 2018-09-14 DIAGNOSIS — R2689 Other abnormalities of gait and mobility: Secondary | ICD-10-CM | POA: Diagnosis not present

## 2018-09-14 DIAGNOSIS — Z741 Need for assistance with personal care: Secondary | ICD-10-CM | POA: Diagnosis not present

## 2018-09-15 DIAGNOSIS — S32001D Stable burst fracture of unspecified lumbar vertebra, subsequent encounter for fracture with routine healing: Secondary | ICD-10-CM | POA: Diagnosis not present

## 2018-09-15 DIAGNOSIS — S32302D Unspecified fracture of left ilium, subsequent encounter for fracture with routine healing: Secondary | ICD-10-CM | POA: Diagnosis not present

## 2018-09-15 DIAGNOSIS — R2689 Other abnormalities of gait and mobility: Secondary | ICD-10-CM | POA: Diagnosis not present

## 2018-09-15 DIAGNOSIS — Z741 Need for assistance with personal care: Secondary | ICD-10-CM | POA: Diagnosis not present

## 2018-09-16 DIAGNOSIS — S32302D Unspecified fracture of left ilium, subsequent encounter for fracture with routine healing: Secondary | ICD-10-CM | POA: Diagnosis not present

## 2018-09-16 DIAGNOSIS — R2689 Other abnormalities of gait and mobility: Secondary | ICD-10-CM | POA: Diagnosis not present

## 2018-09-16 DIAGNOSIS — Z741 Need for assistance with personal care: Secondary | ICD-10-CM | POA: Diagnosis not present

## 2018-09-16 DIAGNOSIS — S32001D Stable burst fracture of unspecified lumbar vertebra, subsequent encounter for fracture with routine healing: Secondary | ICD-10-CM | POA: Diagnosis not present

## 2018-09-17 DIAGNOSIS — R2689 Other abnormalities of gait and mobility: Secondary | ICD-10-CM | POA: Diagnosis not present

## 2018-09-17 DIAGNOSIS — S32001D Stable burst fracture of unspecified lumbar vertebra, subsequent encounter for fracture with routine healing: Secondary | ICD-10-CM | POA: Diagnosis not present

## 2018-09-17 DIAGNOSIS — S32302D Unspecified fracture of left ilium, subsequent encounter for fracture with routine healing: Secondary | ICD-10-CM | POA: Diagnosis not present

## 2018-09-17 DIAGNOSIS — Z741 Need for assistance with personal care: Secondary | ICD-10-CM | POA: Diagnosis not present

## 2018-09-18 DIAGNOSIS — S32001D Stable burst fracture of unspecified lumbar vertebra, subsequent encounter for fracture with routine healing: Secondary | ICD-10-CM | POA: Diagnosis not present

## 2018-09-18 DIAGNOSIS — Z741 Need for assistance with personal care: Secondary | ICD-10-CM | POA: Diagnosis not present

## 2018-09-18 DIAGNOSIS — S32302D Unspecified fracture of left ilium, subsequent encounter for fracture with routine healing: Secondary | ICD-10-CM | POA: Diagnosis not present

## 2018-09-18 DIAGNOSIS — R2689 Other abnormalities of gait and mobility: Secondary | ICD-10-CM | POA: Diagnosis not present

## 2018-09-20 DIAGNOSIS — R2689 Other abnormalities of gait and mobility: Secondary | ICD-10-CM | POA: Diagnosis not present

## 2018-09-20 DIAGNOSIS — S32302D Unspecified fracture of left ilium, subsequent encounter for fracture with routine healing: Secondary | ICD-10-CM | POA: Diagnosis not present

## 2018-09-20 DIAGNOSIS — Z741 Need for assistance with personal care: Secondary | ICD-10-CM | POA: Diagnosis not present

## 2018-09-20 DIAGNOSIS — S32001D Stable burst fracture of unspecified lumbar vertebra, subsequent encounter for fracture with routine healing: Secondary | ICD-10-CM | POA: Diagnosis not present

## 2018-09-21 DIAGNOSIS — R2689 Other abnormalities of gait and mobility: Secondary | ICD-10-CM | POA: Diagnosis not present

## 2018-09-21 DIAGNOSIS — S32302D Unspecified fracture of left ilium, subsequent encounter for fracture with routine healing: Secondary | ICD-10-CM | POA: Diagnosis not present

## 2018-09-21 DIAGNOSIS — S32001D Stable burst fracture of unspecified lumbar vertebra, subsequent encounter for fracture with routine healing: Secondary | ICD-10-CM | POA: Diagnosis not present

## 2018-09-21 DIAGNOSIS — Z741 Need for assistance with personal care: Secondary | ICD-10-CM | POA: Diagnosis not present

## 2018-09-24 DIAGNOSIS — Z7982 Long term (current) use of aspirin: Secondary | ICD-10-CM | POA: Diagnosis not present

## 2018-09-24 DIAGNOSIS — I480 Paroxysmal atrial fibrillation: Secondary | ICD-10-CM | POA: Diagnosis not present

## 2018-09-24 DIAGNOSIS — D696 Thrombocytopenia, unspecified: Secondary | ICD-10-CM | POA: Diagnosis not present

## 2018-09-24 DIAGNOSIS — Z7984 Long term (current) use of oral hypoglycemic drugs: Secondary | ICD-10-CM | POA: Diagnosis not present

## 2018-09-24 DIAGNOSIS — N183 Chronic kidney disease, stage 3 (moderate): Secondary | ICD-10-CM | POA: Diagnosis not present

## 2018-09-24 DIAGNOSIS — E1122 Type 2 diabetes mellitus with diabetic chronic kidney disease: Secondary | ICD-10-CM | POA: Diagnosis not present

## 2018-09-24 DIAGNOSIS — E039 Hypothyroidism, unspecified: Secondary | ICD-10-CM | POA: Diagnosis not present

## 2018-09-24 DIAGNOSIS — Z9981 Dependence on supplemental oxygen: Secondary | ICD-10-CM | POA: Diagnosis not present

## 2018-09-24 DIAGNOSIS — J439 Emphysema, unspecified: Secondary | ICD-10-CM | POA: Diagnosis not present

## 2018-09-24 DIAGNOSIS — Z7901 Long term (current) use of anticoagulants: Secondary | ICD-10-CM | POA: Diagnosis not present

## 2018-09-24 DIAGNOSIS — S32415D Nondisplaced fracture of anterior wall of left acetabulum, subsequent encounter for fracture with routine healing: Secondary | ICD-10-CM | POA: Diagnosis not present

## 2018-09-24 DIAGNOSIS — S80212D Abrasion, left knee, subsequent encounter: Secondary | ICD-10-CM | POA: Diagnosis not present

## 2018-09-24 DIAGNOSIS — I2510 Atherosclerotic heart disease of native coronary artery without angina pectoris: Secondary | ICD-10-CM | POA: Diagnosis not present

## 2018-09-24 DIAGNOSIS — Z8551 Personal history of malignant neoplasm of bladder: Secondary | ICD-10-CM | POA: Diagnosis not present

## 2018-09-24 DIAGNOSIS — S32009D Unspecified fracture of unspecified lumbar vertebra, subsequent encounter for fracture with routine healing: Secondary | ICD-10-CM | POA: Diagnosis not present

## 2018-09-24 DIAGNOSIS — I130 Hypertensive heart and chronic kidney disease with heart failure and stage 1 through stage 4 chronic kidney disease, or unspecified chronic kidney disease: Secondary | ICD-10-CM | POA: Diagnosis not present

## 2018-09-24 DIAGNOSIS — S32302D Unspecified fracture of left ilium, subsequent encounter for fracture with routine healing: Secondary | ICD-10-CM | POA: Diagnosis not present

## 2018-09-24 DIAGNOSIS — Z9181 History of falling: Secondary | ICD-10-CM | POA: Diagnosis not present

## 2018-09-24 DIAGNOSIS — I5033 Acute on chronic diastolic (congestive) heart failure: Secondary | ICD-10-CM | POA: Diagnosis not present

## 2018-09-25 DIAGNOSIS — S32415D Nondisplaced fracture of anterior wall of left acetabulum, subsequent encounter for fracture with routine healing: Secondary | ICD-10-CM | POA: Diagnosis not present

## 2018-09-25 DIAGNOSIS — S32009D Unspecified fracture of unspecified lumbar vertebra, subsequent encounter for fracture with routine healing: Secondary | ICD-10-CM | POA: Diagnosis not present

## 2018-09-25 DIAGNOSIS — I130 Hypertensive heart and chronic kidney disease with heart failure and stage 1 through stage 4 chronic kidney disease, or unspecified chronic kidney disease: Secondary | ICD-10-CM | POA: Diagnosis not present

## 2018-09-25 DIAGNOSIS — E1122 Type 2 diabetes mellitus with diabetic chronic kidney disease: Secondary | ICD-10-CM | POA: Diagnosis not present

## 2018-09-25 DIAGNOSIS — S32302D Unspecified fracture of left ilium, subsequent encounter for fracture with routine healing: Secondary | ICD-10-CM | POA: Diagnosis not present

## 2018-09-25 DIAGNOSIS — I5033 Acute on chronic diastolic (congestive) heart failure: Secondary | ICD-10-CM | POA: Diagnosis not present

## 2018-09-28 ENCOUNTER — Other Ambulatory Visit: Payer: Self-pay | Admitting: *Deleted

## 2018-09-28 DIAGNOSIS — I5033 Acute on chronic diastolic (congestive) heart failure: Secondary | ICD-10-CM | POA: Diagnosis not present

## 2018-09-28 DIAGNOSIS — S32009D Unspecified fracture of unspecified lumbar vertebra, subsequent encounter for fracture with routine healing: Secondary | ICD-10-CM | POA: Diagnosis not present

## 2018-09-28 DIAGNOSIS — S32415D Nondisplaced fracture of anterior wall of left acetabulum, subsequent encounter for fracture with routine healing: Secondary | ICD-10-CM | POA: Diagnosis not present

## 2018-09-28 DIAGNOSIS — S32302D Unspecified fracture of left ilium, subsequent encounter for fracture with routine healing: Secondary | ICD-10-CM | POA: Diagnosis not present

## 2018-09-28 DIAGNOSIS — I130 Hypertensive heart and chronic kidney disease with heart failure and stage 1 through stage 4 chronic kidney disease, or unspecified chronic kidney disease: Secondary | ICD-10-CM | POA: Diagnosis not present

## 2018-09-28 DIAGNOSIS — E1122 Type 2 diabetes mellitus with diabetic chronic kidney disease: Secondary | ICD-10-CM | POA: Diagnosis not present

## 2018-09-28 NOTE — Patient Outreach (Signed)
[facility]) Care Management  09/28/2018  Brad [name] [birth year] [id number]   EMMI-general discharge  RED ON EMMI ALERT Day # 4 Date: 09/26/18 Sunday 1410  Red Alert Reason: know who to call about changes in condition? No    Outreach attempt # 1 No answer. [facility] RN [name] left HIPAA compliant voicemail message along with [name]'s contact info.   Plan: Brad [name] sent an unsuccessful outreach letter and scheduled this patient for another call attempt within 4 business days  Brad [name] L. Brad Hamman, RN, BSN, [place] Coordinator Office number [phone] Mobile number [phone]  Main [facility] number [phone] Fax number [phone]

## 2018-09-29 ENCOUNTER — Other Ambulatory Visit: Payer: Self-pay | Admitting: *Deleted

## 2018-09-29 DIAGNOSIS — S32302D Unspecified fracture of left ilium, subsequent encounter for fracture with routine healing: Secondary | ICD-10-CM | POA: Diagnosis not present

## 2018-09-29 DIAGNOSIS — S32415D Nondisplaced fracture of anterior wall of left acetabulum, subsequent encounter for fracture with routine healing: Secondary | ICD-10-CM | POA: Diagnosis not present

## 2018-09-29 DIAGNOSIS — I130 Hypertensive heart and chronic kidney disease with heart failure and stage 1 through stage 4 chronic kidney disease, or unspecified chronic kidney disease: Secondary | ICD-10-CM | POA: Diagnosis not present

## 2018-09-29 DIAGNOSIS — E1122 Type 2 diabetes mellitus with diabetic chronic kidney disease: Secondary | ICD-10-CM | POA: Diagnosis not present

## 2018-09-29 DIAGNOSIS — S32009D Unspecified fracture of unspecified lumbar vertebra, subsequent encounter for fracture with routine healing: Secondary | ICD-10-CM | POA: Diagnosis not present

## 2018-09-29 DIAGNOSIS — I5033 Acute on chronic diastolic (congestive) heart failure: Secondary | ICD-10-CM | POA: Diagnosis not present

## 2018-09-29 NOTE — Patient Outreach (Signed)
[facility]) Care Management  09/29/2018  CONNIE HILGERT Feb 27, 1931 [id number]   EMMI-general discharge  RED ON EMMI ALERT Day # 4 Date: 09/26/18 Sunday 1410  Red Alert Reason: know who to call about changes in condition? No    Outreach attempt # 2 no answer at the listed home number   No answer at the mobile number. [facility] RN CM left HIPAA compliant voicemail message along with CM's contact info.   Plan: [facility] RN CM scheduled this patient for a third call attempt within 4 business days  Tamario Heal L. Lavina Hamman, RN, BSN, [place] Coordinator Office number [phone] Mobile number [phone]  Main [facility] number [phone] Fax number [phone]

## 2018-09-30 ENCOUNTER — Other Ambulatory Visit: Payer: Self-pay | Admitting: *Deleted

## 2018-09-30 DIAGNOSIS — S32009D Unspecified fracture of unspecified lumbar vertebra, subsequent encounter for fracture with routine healing: Secondary | ICD-10-CM | POA: Diagnosis not present

## 2018-09-30 DIAGNOSIS — S32415D Nondisplaced fracture of anterior wall of left acetabulum, subsequent encounter for fracture with routine healing: Secondary | ICD-10-CM | POA: Diagnosis not present

## 2018-09-30 DIAGNOSIS — S32302D Unspecified fracture of left ilium, subsequent encounter for fracture with routine healing: Secondary | ICD-10-CM | POA: Diagnosis not present

## 2018-09-30 DIAGNOSIS — I130 Hypertensive heart and chronic kidney disease with heart failure and stage 1 through stage 4 chronic kidney disease, or unspecified chronic kidney disease: Secondary | ICD-10-CM | POA: Diagnosis not present

## 2018-09-30 DIAGNOSIS — E1122 Type 2 diabetes mellitus with diabetic chronic kidney disease: Secondary | ICD-10-CM | POA: Diagnosis not present

## 2018-09-30 DIAGNOSIS — I5033 Acute on chronic diastolic (congestive) heart failure: Secondary | ICD-10-CM | POA: Diagnosis not present

## 2018-09-30 NOTE — Patient Outreach (Signed)
  [facility]) Care Management  09/30/2018  Brad [name] Mar 07, 1931 [id number]  EMMI-general discharge (from [facility])   [place] Day #4 Date:09/26/18 Sunday 1410 Red Alert Reason:know who to call about changes in condition? No   Patient returned a call to [place] CM via his mobile number  Patient is able to verify HIPAA Mr Vanpatten gave permission for Mrs Melroy to speak for him He reports she completes all his business Reviewed and addressed referral to [facility] with patient's wife who reports answering the EMMI questions for him    Social: Mr Farver lives with his wife who is the primary caregiver and provides transportation for him. His neighbors also assist prn. He is able to complete his ADLs independent/assist and iADLs with assist. He is receiving HH PT and OT  He is NWB at the time  DME ramp and wheelchair oxygen at night 2 L [place], pulse oximeter,   Conditions: atrial flutter, afib, CAD, CKD, COPD, DM type 2, edema, DOE, esophageal reflux, former smoker, hx of bladder carcinoma s/p chemo tx, hx of CABG, HTN   Medications: They had concerns with 2 medication refills but have gotten issues resolved.   He had his flu shot at [facility] pharmacy this year  Appointments: 10/05/18 with primary care MD, Dr Megan Salon  Advance Directives: Denies need for assist with advance directives    Consent: [facility] RN CM reviewed [facility] services with patient. Patient gave verbal consent for services. He denies need of services from [facility]/Telephonic RN CM, pharmacy, health coach, NP or SW at this time    Plan: [place] CM will close case at this time as patient has been assessed and no needs identified.   [facility] RN CM discussed information in the outreach letter to include the [facility] brochure enclosed for review  Pt encouraged to return a call to [facility] RN CM prn   Havoc Sanluis L. Lavina Hamman, RN, BSN, [place] Coordinator Office number  [phone] Mobile number [phone]  Main [facility] number [phone] Fax number [phone]

## 2018-10-01 ENCOUNTER — Ambulatory Visit: Payer: Self-pay | Admitting: *Deleted

## 2018-10-04 DIAGNOSIS — S32415D Nondisplaced fracture of anterior wall of left acetabulum, subsequent encounter for fracture with routine healing: Secondary | ICD-10-CM | POA: Diagnosis not present

## 2018-10-04 DIAGNOSIS — S32009D Unspecified fracture of unspecified lumbar vertebra, subsequent encounter for fracture with routine healing: Secondary | ICD-10-CM | POA: Diagnosis not present

## 2018-10-04 DIAGNOSIS — I130 Hypertensive heart and chronic kidney disease with heart failure and stage 1 through stage 4 chronic kidney disease, or unspecified chronic kidney disease: Secondary | ICD-10-CM | POA: Diagnosis not present

## 2018-10-04 DIAGNOSIS — S32302D Unspecified fracture of left ilium, subsequent encounter for fracture with routine healing: Secondary | ICD-10-CM | POA: Diagnosis not present

## 2018-10-04 DIAGNOSIS — I5033 Acute on chronic diastolic (congestive) heart failure: Secondary | ICD-10-CM | POA: Diagnosis not present

## 2018-10-04 DIAGNOSIS — E1122 Type 2 diabetes mellitus with diabetic chronic kidney disease: Secondary | ICD-10-CM | POA: Diagnosis not present

## 2018-10-05 DIAGNOSIS — S32402A Unspecified fracture of left acetabulum, initial encounter for closed fracture: Secondary | ICD-10-CM | POA: Diagnosis not present

## 2018-10-05 DIAGNOSIS — W19XXXA Unspecified fall, initial encounter: Secondary | ICD-10-CM | POA: Diagnosis not present

## 2018-10-05 DIAGNOSIS — S32309A Unspecified fracture of unspecified ilium, initial encounter for closed fracture: Secondary | ICD-10-CM | POA: Diagnosis not present

## 2018-10-05 DIAGNOSIS — Z79899 Other long term (current) drug therapy: Secondary | ICD-10-CM | POA: Diagnosis not present

## 2018-10-05 DIAGNOSIS — Z6826 Body mass index (BMI) 26.0-26.9, adult: Secondary | ICD-10-CM | POA: Diagnosis not present

## 2018-10-07 DIAGNOSIS — I5033 Acute on chronic diastolic (congestive) heart failure: Secondary | ICD-10-CM | POA: Diagnosis not present

## 2018-10-07 DIAGNOSIS — E1122 Type 2 diabetes mellitus with diabetic chronic kidney disease: Secondary | ICD-10-CM | POA: Diagnosis not present

## 2018-10-07 DIAGNOSIS — I130 Hypertensive heart and chronic kidney disease with heart failure and stage 1 through stage 4 chronic kidney disease, or unspecified chronic kidney disease: Secondary | ICD-10-CM | POA: Diagnosis not present

## 2018-10-07 DIAGNOSIS — S32415D Nondisplaced fracture of anterior wall of left acetabulum, subsequent encounter for fracture with routine healing: Secondary | ICD-10-CM | POA: Diagnosis not present

## 2018-10-07 DIAGNOSIS — S32302D Unspecified fracture of left ilium, subsequent encounter for fracture with routine healing: Secondary | ICD-10-CM | POA: Diagnosis not present

## 2018-10-07 DIAGNOSIS — S32009D Unspecified fracture of unspecified lumbar vertebra, subsequent encounter for fracture with routine healing: Secondary | ICD-10-CM | POA: Diagnosis not present

## 2018-10-08 DIAGNOSIS — S32415D Nondisplaced fracture of anterior wall of left acetabulum, subsequent encounter for fracture with routine healing: Secondary | ICD-10-CM | POA: Diagnosis not present

## 2018-10-08 DIAGNOSIS — Z96649 Presence of unspecified artificial hip joint: Secondary | ICD-10-CM | POA: Diagnosis not present

## 2018-10-08 DIAGNOSIS — S32009D Unspecified fracture of unspecified lumbar vertebra, subsequent encounter for fracture with routine healing: Secondary | ICD-10-CM | POA: Diagnosis not present

## 2018-10-08 DIAGNOSIS — S32302D Unspecified fracture of left ilium, subsequent encounter for fracture with routine healing: Secondary | ICD-10-CM | POA: Diagnosis not present

## 2018-10-08 DIAGNOSIS — S3210XD Unspecified fracture of sacrum, subsequent encounter for fracture with routine healing: Secondary | ICD-10-CM | POA: Diagnosis not present

## 2018-10-11 ENCOUNTER — Encounter: Payer: Self-pay | Admitting: Cardiology

## 2018-10-11 ENCOUNTER — Ambulatory Visit (INDEPENDENT_AMBULATORY_CARE_PROVIDER_SITE_OTHER): Payer: Medicare Other | Admitting: Cardiology

## 2018-10-11 DIAGNOSIS — I739 Peripheral vascular disease, unspecified: Secondary | ICD-10-CM | POA: Diagnosis not present

## 2018-10-11 DIAGNOSIS — I2510 Atherosclerotic heart disease of native coronary artery without angina pectoris: Secondary | ICD-10-CM | POA: Diagnosis not present

## 2018-10-11 DIAGNOSIS — S32009D Unspecified fracture of unspecified lumbar vertebra, subsequent encounter for fracture with routine healing: Secondary | ICD-10-CM | POA: Diagnosis not present

## 2018-10-11 DIAGNOSIS — I4821 Permanent atrial fibrillation: Secondary | ICD-10-CM

## 2018-10-11 DIAGNOSIS — S32302D Unspecified fracture of left ilium, subsequent encounter for fracture with routine healing: Secondary | ICD-10-CM | POA: Diagnosis not present

## 2018-10-11 DIAGNOSIS — E1122 Type 2 diabetes mellitus with diabetic chronic kidney disease: Secondary | ICD-10-CM | POA: Diagnosis not present

## 2018-10-11 DIAGNOSIS — Z951 Presence of aortocoronary bypass graft: Secondary | ICD-10-CM

## 2018-10-11 DIAGNOSIS — C679 Malignant neoplasm of bladder, unspecified: Secondary | ICD-10-CM

## 2018-10-11 DIAGNOSIS — S32415D Nondisplaced fracture of anterior wall of left acetabulum, subsequent encounter for fracture with routine healing: Secondary | ICD-10-CM | POA: Diagnosis not present

## 2018-10-11 DIAGNOSIS — I130 Hypertensive heart and chronic kidney disease with heart failure and stage 1 through stage 4 chronic kidney disease, or unspecified chronic kidney disease: Secondary | ICD-10-CM | POA: Diagnosis not present

## 2018-10-11 DIAGNOSIS — I5033 Acute on chronic diastolic (congestive) heart failure: Secondary | ICD-10-CM | POA: Diagnosis not present

## 2018-10-11 HISTORY — DX: Malignant neoplasm of bladder, unspecified: C67.9

## 2018-10-11 NOTE — Patient Instructions (Signed)
Medication Instructions:  Your physician recommends that you continue on your current medications as directed. Please refer to the Current Medication list given to you today.  If you need a refill on your cardiac medications before your next appointment, please call your pharmacy.   Lab work: None.  If you have labs (blood work) drawn today and your tests are completely normal, you will receive your results only by: Marland Kitchen MyChart Message (if you have MyChart) OR . A paper copy in the mail If you have any lab test that is abnormal or we need to change your treatment, we will call you to review the results.  Testing/Procedures: Your physician has requested that you have an echocardiogram. Echocardiography is a painless test that uses sound waves to create images of your heart. It provides your doctor with information about the size and shape of your heart and how well your heart's chambers and valves are working. This procedure takes approximately one hour. There are no restrictions for this procedure.    Your physician has requested that you have a lower or upper extremity arterial duplex. This test is an ultrasound of the arteries in the legs or arms. It looks at arterial blood flow in the legs and arms. Allow one hour for Lower and Upper Arterial scans. There are no restrictions or special instructions   Follow-Up: At [facility], you and your health needs are our priority.  As part of our continuing mission to provide you with exceptional heart care, we have created designated Provider Care Teams.  These Care Teams include your primary Cardiologist (physician) and Advanced Practice Providers (APPs -  Physician Assistants and Nurse Practitioners) who all work together to provide you with the care you need, when you need it. You will need a follow up appointment in 1 months.  Please call our office 2 months in advance to schedule this appointment.  You may see No primary care provider on file.  or another member of our [employer] Provider Team in [place]: Shirlee More, MD . Jyl Heinz, MD  Any Other Special Instructions Will Be Listed Below (If Applicable).   Echocardiogram An echocardiogram, or echocardiography, uses sound waves (ultrasound) to produce an image of your heart. The echocardiogram is simple, painless, obtained within a short period of time, and offers valuable information to your health care provider. The images from an echocardiogram can provide information such as:  Evidence of coronary artery disease (CAD).  Heart size.  Heart muscle function.  Heart valve function.  Aneurysm detection.  Evidence of a past heart attack.  Fluid buildup around the heart.  Heart muscle thickening.  Assess heart valve function.  Tell a health care provider about:  Any allergies you have.  All medicines you are taking, including vitamins, herbs, eye drops, creams, and over-the-counter medicines.  Any problems you or family members have had with anesthetic medicines.  Any blood disorders you have.  Any surgeries you have had.  Any medical conditions you have.  Whether you are pregnant or may be pregnant. What happens before the procedure? No special preparation is needed. Eat and drink normally. What happens during the procedure?  In order to produce an image of your heart, gel will be applied to your chest and a wand-like tool (transducer) will be moved over your chest. The gel will help transmit the sound waves from the transducer. The sound waves will harmlessly bounce off your heart to allow the heart images to be captured in real-time  motion. These images will then be recorded.  You may need an IV to receive a medicine that improves the quality of the pictures. What happens after the procedure? You may return to your normal schedule including diet, activities, and medicines, unless your health care provider tells you otherwise. This information  is not intended to replace advice given to you by your health care provider. Make sure you discuss any questions you have with your health care provider. Document Released: 11/14/2000 Document Revised: 07/05/2016 Document Reviewed: 07/25/2013 Elsevier Interactive Patient Education  2017 [employer].

## 2018-10-11 NOTE — Progress Notes (Signed)
Cardiology Office Note:    Date:  10/11/2018   ID:  Brad [name], DOB [birth year], MRN [id number]  PCP:  Brad [name]., MD  Cardiologist:  Brad Campus, MD    Referring MD: Brad [name]., MD   Chief Complaint  Patient presents with  . Follow-up  Doing fair  History of Present Illness:    Brad [name] is a 84 y.o. male with multiple medical problems that include coronary artery disease peripheral vascular disease permanent atrial fibrillation history of bladder cancer with radiation therapy and chemotherapy.  Comes to my office for follow-up after not being seen for more than a year.  He aged quite significantly he looks much older today he barely can move around he walks with a walker he does have some compression fracture of his thoracic spine which of course complicate the situation a lot.  His wife who is a retired Software engineer from our hospital brought him here today because she would like to have his lower extremities check.  He denies having any pain in his lower extremities the only thing he feels is when he goes to sleep he will feel like socks on his feet.  Concern is he got multiple what appears to be embolic lesions on his distal pulse and feet.  I cannot palpate pulse in his feet however capillary refill is quite brisk.  He is able to move around with difficulties but what created difficulties chronic back pain rather than problem with his feet.  Past Medical History:  Diagnosis Date  . Allergic rhinitis   . Chronic airflow obstruction (HCC)   . Coronary atherosclerosis   . DJD (degenerative joint disease)   . DM (diabetes mellitus) type II controlled peripheral vascular disorder   . GERD (gastroesophageal reflux disease)   . Hyperlipidemia   . Hypertension   . Osteoarthritis   . Renal insufficiency       Current Medications: Current Meds  Medication Sig  . Fluticasone-Salmeterol (ADVAIR) 250-50 MCG/DOSE AEPB Inhale 1 puff into the lungs every 12  (twelve) hours.  . metoprolol succinate (TOPROL-XL) 25 MG 24 hr tablet Take 37.5 mg by mouth daily. Take with or immediately following a meal.   . Omega-3 Fatty Acids (FISH OIL) 1200 MG CAPS Take 1 capsule by mouth daily.     Allergies:   Fentanyl; Morphine; Atorvastatin; Penicillins; and Prednisone   Social History   Socioeconomic History  . Marital status: Married    Spouse name: Not on file  . Number of children: Not on file  . Years of education: Not on file  . Highest education level: Not on file  Occupational History  . Occupation: retired  Scientific laboratory technician  . Financial resource strain: Not on file  . Food insecurity:    Worry: Not on file    Inability: Not on file  . Transportation needs:    Medical: Not on file    Non-medical: Not on file  Tobacco Use  . Smoking status: Former Smoker    Types: Cigarettes  . Smokeless tobacco: Never Used  Substance and Sexual Activity  . Alcohol use: Yes    Comment: occasionally  . Drug use: No  . Sexual activity: Not on file  Lifestyle  . Physical activity:    Days per week: Not on file    Minutes per session: Not on file  . Stress: Not on file  Relationships  . Social connections:    Talks on phone: Not on file  Gets together: Not on file    Attends religious service: Not on file    Active member of club or organization: Not on file    Attends meetings of clubs or organizations: Not on file    Relationship status: Not on file  Other Topics Concern  . Not on file  Social History Narrative  . Not on file     Family History: The patient's family history is not on file. ROS:   Please see the history of present illness.    All 14 point review of systems negative except as described per history of present illness  EKGs/Labs/Other Studies Reviewed:      Recent Labs: No results found for requested labs within last 8760 hours.  Recent Lipid Panel No results found for: CHOL, TRIG, HDL, CHOLHDL, VLDL, LDLCALC,  LDLDIRECT  Physical Exam:    VS:  BP 90/62   Pulse (!) 54   Ht 5\' 6"  (1.676 m)   Wt 170 lb (77.1 kg)   SpO2 90%   BMI 27.44 kg/m     Wt Readings from Last 3 Encounters:  10/11/18 170 lb (77.1 kg)     GEN:  Well nourished, well developed in no acute distress HEENT: Normal NECK: No JVD; No carotid bruits LYMPHATICS: No lymphadenopathy CARDIAC: RRR, no murmurs, no rubs, no gallops RESPIRATORY:  Clear to auscultation without rales, wheezing or rhonchi  ABDOMEN: Soft, non-tender, non-distended MUSCULOSKELETAL:  No edema; No deformity  SKIN: Warm and dry LOWER EXTREMITIES: no swelling NEUROLOGIC:  Alert and oriented x 3 PSYCHIATRIC:  Normal affect   ASSESSMENT:    1. Coronary artery disease involving native coronary artery of native heart without angina pectoris   2. Permanent atrial fibrillation   3. Malignant neoplasm of urinary bladder, unspecified site ([place])   4. Peripheral vascular disease ([place])   5. History of coronary artery bypass graft    PLAN:    In order of problems listed above:  1. Persistent atrial fibrillation.  He is anticoagulated with Eliquis which I will continue. 2. Peripheral vascular disease I will ask him to have arterial duplex of lower extremities. 3. History of coronary artery disease stable denies have any chest pain.  We will schedule him to have echocardiogram. 4. Diabetes mellitus to be followed by internal medicine. 5. I do not see him being on statin therapy we will try to investigate the reason for that.  Overall [name] deteriorated quite significantly since I seen him last time.   Medication Adjustments/Labs and Tests Ordered: Current medicines are reviewed at length with the patient today.  Concerns regarding medicines are outlined above.  No orders of the defined types were placed in this encounter.  Medication changes: No orders of the defined types were placed in this encounter.   Signed, Brad Liter, MD, [facility] 10/11/2018  2:49 PM    [place]

## 2018-10-12 ENCOUNTER — Ambulatory Visit (INDEPENDENT_AMBULATORY_CARE_PROVIDER_SITE_OTHER): Payer: Medicare Other

## 2018-10-12 DIAGNOSIS — I739 Peripheral vascular disease, unspecified: Secondary | ICD-10-CM

## 2018-10-12 NOTE — Progress Notes (Signed)
Complete lower extremity arterial duplex exam has been performed. Left external iliac artery was 30-49% stenosed, Bilateral posterior tibial arteries were 30-49% stenosed. Atherosclerosis seen bilaterally.  Jimmy Amilia Vandenbrink RDCS, RVT

## 2018-10-13 DIAGNOSIS — I130 Hypertensive heart and chronic kidney disease with heart failure and stage 1 through stage 4 chronic kidney disease, or unspecified chronic kidney disease: Secondary | ICD-10-CM | POA: Diagnosis not present

## 2018-10-13 DIAGNOSIS — S32415D Nondisplaced fracture of anterior wall of left acetabulum, subsequent encounter for fracture with routine healing: Secondary | ICD-10-CM | POA: Diagnosis not present

## 2018-10-13 DIAGNOSIS — E1122 Type 2 diabetes mellitus with diabetic chronic kidney disease: Secondary | ICD-10-CM | POA: Diagnosis not present

## 2018-10-13 DIAGNOSIS — N401 Enlarged prostate with lower urinary tract symptoms: Secondary | ICD-10-CM | POA: Diagnosis not present

## 2018-10-13 DIAGNOSIS — S32009D Unspecified fracture of unspecified lumbar vertebra, subsequent encounter for fracture with routine healing: Secondary | ICD-10-CM | POA: Diagnosis not present

## 2018-10-13 DIAGNOSIS — C672 Malignant neoplasm of lateral wall of bladder: Secondary | ICD-10-CM | POA: Diagnosis not present

## 2018-10-13 DIAGNOSIS — I5033 Acute on chronic diastolic (congestive) heart failure: Secondary | ICD-10-CM | POA: Diagnosis not present

## 2018-10-13 DIAGNOSIS — S32302D Unspecified fracture of left ilium, subsequent encounter for fracture with routine healing: Secondary | ICD-10-CM | POA: Diagnosis not present

## 2018-10-14 DIAGNOSIS — S32302D Unspecified fracture of left ilium, subsequent encounter for fracture with routine healing: Secondary | ICD-10-CM | POA: Diagnosis not present

## 2018-10-14 DIAGNOSIS — I130 Hypertensive heart and chronic kidney disease with heart failure and stage 1 through stage 4 chronic kidney disease, or unspecified chronic kidney disease: Secondary | ICD-10-CM | POA: Diagnosis not present

## 2018-10-14 DIAGNOSIS — S32009D Unspecified fracture of unspecified lumbar vertebra, subsequent encounter for fracture with routine healing: Secondary | ICD-10-CM | POA: Diagnosis not present

## 2018-10-14 DIAGNOSIS — E1122 Type 2 diabetes mellitus with diabetic chronic kidney disease: Secondary | ICD-10-CM | POA: Diagnosis not present

## 2018-10-14 DIAGNOSIS — I5033 Acute on chronic diastolic (congestive) heart failure: Secondary | ICD-10-CM | POA: Diagnosis not present

## 2018-10-14 DIAGNOSIS — S32415D Nondisplaced fracture of anterior wall of left acetabulum, subsequent encounter for fracture with routine healing: Secondary | ICD-10-CM | POA: Diagnosis not present

## 2018-10-19 DIAGNOSIS — E1122 Type 2 diabetes mellitus with diabetic chronic kidney disease: Secondary | ICD-10-CM | POA: Diagnosis not present

## 2018-10-19 DIAGNOSIS — S32415D Nondisplaced fracture of anterior wall of left acetabulum, subsequent encounter for fracture with routine healing: Secondary | ICD-10-CM | POA: Diagnosis not present

## 2018-10-19 DIAGNOSIS — I130 Hypertensive heart and chronic kidney disease with heart failure and stage 1 through stage 4 chronic kidney disease, or unspecified chronic kidney disease: Secondary | ICD-10-CM | POA: Diagnosis not present

## 2018-10-19 DIAGNOSIS — S32302D Unspecified fracture of left ilium, subsequent encounter for fracture with routine healing: Secondary | ICD-10-CM | POA: Diagnosis not present

## 2018-10-19 DIAGNOSIS — S32009D Unspecified fracture of unspecified lumbar vertebra, subsequent encounter for fracture with routine healing: Secondary | ICD-10-CM | POA: Diagnosis not present

## 2018-10-19 DIAGNOSIS — I5033 Acute on chronic diastolic (congestive) heart failure: Secondary | ICD-10-CM | POA: Diagnosis not present

## 2018-10-20 DIAGNOSIS — S32009D Unspecified fracture of unspecified lumbar vertebra, subsequent encounter for fracture with routine healing: Secondary | ICD-10-CM | POA: Diagnosis not present

## 2018-10-20 DIAGNOSIS — E1122 Type 2 diabetes mellitus with diabetic chronic kidney disease: Secondary | ICD-10-CM | POA: Diagnosis not present

## 2018-10-20 DIAGNOSIS — S32302D Unspecified fracture of left ilium, subsequent encounter for fracture with routine healing: Secondary | ICD-10-CM | POA: Diagnosis not present

## 2018-10-20 DIAGNOSIS — I5033 Acute on chronic diastolic (congestive) heart failure: Secondary | ICD-10-CM | POA: Diagnosis not present

## 2018-10-20 DIAGNOSIS — S32415D Nondisplaced fracture of anterior wall of left acetabulum, subsequent encounter for fracture with routine healing: Secondary | ICD-10-CM | POA: Diagnosis not present

## 2018-10-20 DIAGNOSIS — I130 Hypertensive heart and chronic kidney disease with heart failure and stage 1 through stage 4 chronic kidney disease, or unspecified chronic kidney disease: Secondary | ICD-10-CM | POA: Diagnosis not present

## 2018-10-25 DIAGNOSIS — S32302D Unspecified fracture of left ilium, subsequent encounter for fracture with routine healing: Secondary | ICD-10-CM | POA: Diagnosis not present

## 2018-10-25 DIAGNOSIS — I130 Hypertensive heart and chronic kidney disease with heart failure and stage 1 through stage 4 chronic kidney disease, or unspecified chronic kidney disease: Secondary | ICD-10-CM | POA: Diagnosis not present

## 2018-10-25 DIAGNOSIS — S32009D Unspecified fracture of unspecified lumbar vertebra, subsequent encounter for fracture with routine healing: Secondary | ICD-10-CM | POA: Diagnosis not present

## 2018-10-25 DIAGNOSIS — E1122 Type 2 diabetes mellitus with diabetic chronic kidney disease: Secondary | ICD-10-CM | POA: Diagnosis not present

## 2018-10-25 DIAGNOSIS — I5033 Acute on chronic diastolic (congestive) heart failure: Secondary | ICD-10-CM | POA: Diagnosis not present

## 2018-10-25 DIAGNOSIS — S32415D Nondisplaced fracture of anterior wall of left acetabulum, subsequent encounter for fracture with routine healing: Secondary | ICD-10-CM | POA: Diagnosis not present

## 2018-11-03 DIAGNOSIS — M5416 Radiculopathy, lumbar region: Secondary | ICD-10-CM | POA: Diagnosis not present

## 2018-11-11 ENCOUNTER — Encounter: Payer: Self-pay | Admitting: Cardiology

## 2018-11-11 ENCOUNTER — Ambulatory Visit (INDEPENDENT_AMBULATORY_CARE_PROVIDER_SITE_OTHER): Payer: Medicare Other | Admitting: Cardiology

## 2018-11-11 VITALS — BP 110/70 | HR 56 | Resp 12 | Ht 66.0 in | Wt 171.0 lb

## 2018-11-11 DIAGNOSIS — I2510 Atherosclerotic heart disease of native coronary artery without angina pectoris: Secondary | ICD-10-CM

## 2018-11-11 DIAGNOSIS — C679 Malignant neoplasm of bladder, unspecified: Secondary | ICD-10-CM | POA: Diagnosis not present

## 2018-11-11 DIAGNOSIS — Z951 Presence of aortocoronary bypass graft: Secondary | ICD-10-CM

## 2018-11-11 DIAGNOSIS — I739 Peripheral vascular disease, unspecified: Secondary | ICD-10-CM | POA: Diagnosis not present

## 2018-11-11 DIAGNOSIS — I4821 Permanent atrial fibrillation: Secondary | ICD-10-CM | POA: Diagnosis not present

## 2018-11-11 NOTE — Progress Notes (Signed)
Cardiology Office Note:    Date:  11/11/2018   ID:  Brad [name], DOB 1931/01/06, MRN [id number]  PCP:  Helen Hashimoto., MD  Cardiologist:  Jenne Campus, MD    Referring MD: Helen Hashimoto., MD   Chief Complaint  Patient presents with  . Follow up on Testing  Doing fine seems to be cheerful today  History of Present Illness:    Brad [name] is a 84 y.o. male with multiple medical problems which include coronary artery disease coronary artery bypass graft peripheral vascular disease permanent atrial fibrillation.  Comes that he left the follow-up last time we discussed the issue of blue toes.  He did have a vascular study lower extremities which did not show any significant stenosis.  Overall he complained of having back plain but he said cardiac wise doing well no chest pain tightness squeezing pressure in chest.  Awaiting echocardiogram which is scheduled in the next few days  Past Medical History:  Diagnosis Date  . Allergic rhinitis   . Chronic airflow obstruction (HCC)   . Coronary atherosclerosis   . DJD (degenerative joint disease)   . DM (diabetes mellitus) type II controlled peripheral vascular disorder   . GERD (gastroesophageal reflux disease)   . Hyperlipidemia   . Hypertension   . Osteoarthritis   . Renal insufficiency       Current Medications: Current Meds  Medication Sig  . dorzolamide-timolol (COSOPT) 22.3-6.8 MG/ML ophthalmic solution Place 1 drop into both eyes daily.  Marland Kitchen ELIQUIS 2.5 MG TABS tablet Take 2.5 mg by mouth 2 (two) times daily.  . famotidine (PEPCID) 20 MG tablet Take 1 tablet by mouth 2 (two) times daily.  . Fluticasone-Salmeterol (ADVAIR) 250-50 MCG/DOSE AEPB Inhale 1 puff into the lungs every 12 (twelve) hours.  . furosemide (LASIX) 20 MG tablet TAKE 2 TABLETS BY MOUTH IN THE MORNING AND 1 TABLET IN THE EVERNING.  Marland Kitchen Ipratropium-Albuterol (COMBIVENT RESPIMAT) 20-100 MCG/ACT AERS respimat Inhale 1 puff into the lungs 4 (four)  times daily as needed.  Marland Kitchen JANUVIA 50 MG tablet Take 50 mg by mouth daily.  Marland Kitchen latanoprost (XALATAN) 0.005 % ophthalmic solution Place 1 drop into both eyes daily.  Marland Kitchen levothyroxine (SYNTHROID, LEVOTHROID) 25 MCG tablet Take 1 tablet by mouth daily.  . metoprolol succinate (TOPROL-XL) 25 MG 24 hr tablet Take 37.5 mg by mouth daily. Take with or immediately following a meal.   . Omega-3 Fatty Acids (FISH OIL) 1200 MG CAPS Take 1 capsule by mouth daily.  Marland Kitchen oxybutynin (DITROPAN) 5 MG tablet Take 1 tablet by mouth 2 (two) times daily.  . ramipril (ALTACE) 10 MG capsule Take 10 mg by mouth daily.  . ranolazine (RANEXA) 500 MG 12 hr tablet Take 500 mg by mouth 2 (two) times daily.  . SYMBICORT 160-4.5 MCG/ACT inhaler Inhale 2 puffs into the lungs daily.  . tamsulosin (FLOMAX) 0.4 MG CAPS capsule Take 1 capsule by mouth 2 (two) times daily.  . traMADol (ULTRAM) 50 MG tablet Take 50 mg by mouth as needed.     Allergies:   Fentanyl; Morphine; Atorvastatin; Penicillins; and Prednisone   Social History   Socioeconomic History  . Marital status: Married    Spouse name: Not on file  . Number of children: Not on file  . Years of education: Not on file  . Highest education level: Not on file  Occupational History  . Occupation: retired  Scientific laboratory technician  . Financial resource strain: Not on file  .  Food insecurity:    Worry: Not on file    Inability: Not on file  . Transportation needs:    Medical: Not on file    Non-medical: Not on file  Tobacco Use  . Smoking status: Former Smoker    Types: Cigarettes  . Smokeless tobacco: Never Used  Substance and Sexual Activity  . Alcohol use: Yes    Comment: occasionally  . Drug use: No  . Sexual activity: Not on file  Lifestyle  . Physical activity:    Days per week: Not on file    Minutes per session: Not on file  . Stress: Not on file  Relationships  . Social connections:    Talks on phone: Not on file    Gets together: Not on file    Attends  religious service: Not on file    Active member of club or organization: Not on file    Attends meetings of clubs or organizations: Not on file    Relationship status: Not on file  Other Topics Concern  . Not on file  Social History Narrative  . Not on file     Family History: The patient's family history is not on file. ROS:   Please see the history of present illness.    All 14 point review of systems negative except as described per history of present illness  EKGs/Labs/Other Studies Reviewed:      Recent Labs: No results found for requested labs within last 8760 hours.  Recent Lipid Panel No results found for: CHOL, TRIG, HDL, CHOLHDL, VLDL, LDLCALC, LDLDIRECT  Physical Exam:    VS:  BP 110/70   Pulse (!) 56   Resp 12   Ht 5\' 6"  (1.676 m)   Wt 171 lb (77.6 kg)   BMI 27.60 kg/m     Wt Readings from Last 3 Encounters:  11/11/18 171 lb (77.6 kg)  10/11/18 170 lb (77.1 kg)     GEN:  Well nourished, well developed in no acute distress HEENT: Normal NECK: No JVD; No carotid bruits LYMPHATICS: No lymphadenopathy CARDIAC: RRR, no murmurs, no rubs, no gallops RESPIRATORY:  Clear to auscultation without rales, wheezing or rhonchi  ABDOMEN: Soft, non-tender, non-distended MUSCULOSKELETAL:  No edema; No deformity  SKIN: Warm and dry LOWER EXTREMITIES: no swelling NEUROLOGIC:  Alert and oriented x 3 PSYCHIATRIC:  Normal affect   ASSESSMENT:    1. Coronary artery disease involving native coronary artery of native heart without angina pectoris   2. Permanent atrial fibrillation   3. Peripheral vascular disease ([place])   4. Malignant neoplasm of urinary bladder, unspecified site ([place])   5. History of coronary artery bypass graft    PLAN:    In order of problems listed above:  1. Coronary artery disease stable from that point we will continue present management. 2. Permanent atrial fibrillation anticoagulated which I will continue. 3. Urinary bladder cancer  followed by urology. 4. History of coronary artery bypass grafting well from that point of view.   Medication Adjustments/Labs and Tests Ordered: Current medicines are reviewed at length with the patient today.  Concerns regarding medicines are outlined above.  No orders of the defined types were placed in this encounter.  Medication changes: No orders of the defined types were placed in this encounter.   Signed, Park Liter, MD, [facility] 11/11/2018 11:08 AM    [place]

## 2018-11-11 NOTE — Patient Instructions (Signed)
Medication Instructions:  Your physician recommends that you continue on your current medications as directed. Please refer to the Current Medication list given to you today.  If you need a refill on your cardiac medications before your next appointment, please call your pharmacy.   Lab work: None.  If you have labs (blood work) drawn today and your tests are completely normal, you will receive your results only by: . MyChart Message (if you have MyChart) OR . A paper copy in the mail If you have any lab test that is abnormal or we need to change your treatment, we will call you to review the results.  Testing/Procedures: None.   Follow-Up: At [facility], you and your health needs are our priority.  As part of our continuing mission to provide you with exceptional heart care, we have created designated Provider Care Teams.  These Care Teams include your primary Cardiologist (physician) and Advanced Practice Providers (APPs -  Physician Assistants and Nurse Practitioners) who all work together to provide you with the care you need, when you need it. You will need a follow up appointment in 3 months.  Please call our office 2 months in advance to schedule this appointment.  You may see Robert Krasowski, MD or another member of our [facility] Provider Team in [place]: Brian Munley, MD . Rajan Revankar, MD  Any Other Special Instructions Will Be Listed Below (If Applicable).     

## 2018-11-16 DIAGNOSIS — H401113 Primary open-angle glaucoma, right eye, severe stage: Secondary | ICD-10-CM | POA: Diagnosis not present

## 2018-11-17 DIAGNOSIS — L570 Actinic keratosis: Secondary | ICD-10-CM | POA: Diagnosis not present

## 2018-11-17 DIAGNOSIS — L821 Other seborrheic keratosis: Secondary | ICD-10-CM | POA: Diagnosis not present

## 2018-11-17 DIAGNOSIS — C44729 Squamous cell carcinoma of skin of left lower limb, including hip: Secondary | ICD-10-CM | POA: Diagnosis not present

## 2018-11-17 DIAGNOSIS — L578 Other skin changes due to chronic exposure to nonionizing radiation: Secondary | ICD-10-CM | POA: Diagnosis not present

## 2018-11-17 DIAGNOSIS — C44629 Squamous cell carcinoma of skin of left upper limb, including shoulder: Secondary | ICD-10-CM | POA: Diagnosis not present

## 2018-11-18 DIAGNOSIS — M5136 Other intervertebral disc degeneration, lumbar region: Secondary | ICD-10-CM | POA: Diagnosis not present

## 2018-11-18 DIAGNOSIS — M25551 Pain in right hip: Secondary | ICD-10-CM | POA: Diagnosis not present

## 2018-11-18 DIAGNOSIS — M549 Dorsalgia, unspecified: Secondary | ICD-10-CM | POA: Diagnosis not present

## 2018-11-18 DIAGNOSIS — M5416 Radiculopathy, lumbar region: Secondary | ICD-10-CM | POA: Diagnosis not present

## 2018-11-18 DIAGNOSIS — M7061 Trochanteric bursitis, right hip: Secondary | ICD-10-CM | POA: Diagnosis not present

## 2018-11-18 DIAGNOSIS — Z96641 Presence of right artificial hip joint: Secondary | ICD-10-CM | POA: Diagnosis not present

## 2018-11-19 DIAGNOSIS — S32302D Unspecified fracture of left ilium, subsequent encounter for fracture with routine healing: Secondary | ICD-10-CM | POA: Diagnosis not present

## 2018-11-19 DIAGNOSIS — S32009D Unspecified fracture of unspecified lumbar vertebra, subsequent encounter for fracture with routine healing: Secondary | ICD-10-CM | POA: Diagnosis not present

## 2018-11-19 DIAGNOSIS — E1122 Type 2 diabetes mellitus with diabetic chronic kidney disease: Secondary | ICD-10-CM | POA: Diagnosis not present

## 2018-11-19 DIAGNOSIS — S32415D Nondisplaced fracture of anterior wall of left acetabulum, subsequent encounter for fracture with routine healing: Secondary | ICD-10-CM | POA: Diagnosis not present

## 2018-11-19 DIAGNOSIS — I130 Hypertensive heart and chronic kidney disease with heart failure and stage 1 through stage 4 chronic kidney disease, or unspecified chronic kidney disease: Secondary | ICD-10-CM | POA: Diagnosis not present

## 2018-11-19 DIAGNOSIS — I5033 Acute on chronic diastolic (congestive) heart failure: Secondary | ICD-10-CM | POA: Diagnosis not present

## 2018-11-23 ENCOUNTER — Ambulatory Visit (INDEPENDENT_AMBULATORY_CARE_PROVIDER_SITE_OTHER): Payer: Medicare Other

## 2018-11-23 DIAGNOSIS — N183 Chronic kidney disease, stage 3 (moderate): Secondary | ICD-10-CM | POA: Diagnosis not present

## 2018-11-23 DIAGNOSIS — I480 Paroxysmal atrial fibrillation: Secondary | ICD-10-CM | POA: Diagnosis not present

## 2018-11-23 DIAGNOSIS — I2510 Atherosclerotic heart disease of native coronary artery without angina pectoris: Secondary | ICD-10-CM

## 2018-11-23 DIAGNOSIS — S32415D Nondisplaced fracture of anterior wall of left acetabulum, subsequent encounter for fracture with routine healing: Secondary | ICD-10-CM | POA: Diagnosis not present

## 2018-11-23 DIAGNOSIS — S32302D Unspecified fracture of left ilium, subsequent encounter for fracture with routine healing: Secondary | ICD-10-CM | POA: Diagnosis not present

## 2018-11-23 DIAGNOSIS — Z7901 Long term (current) use of anticoagulants: Secondary | ICD-10-CM | POA: Diagnosis not present

## 2018-11-23 DIAGNOSIS — Z9981 Dependence on supplemental oxygen: Secondary | ICD-10-CM | POA: Diagnosis not present

## 2018-11-23 DIAGNOSIS — Z9181 History of falling: Secondary | ICD-10-CM | POA: Diagnosis not present

## 2018-11-23 DIAGNOSIS — Z7982 Long term (current) use of aspirin: Secondary | ICD-10-CM | POA: Diagnosis not present

## 2018-11-23 DIAGNOSIS — M5416 Radiculopathy, lumbar region: Secondary | ICD-10-CM | POA: Diagnosis not present

## 2018-11-23 DIAGNOSIS — I130 Hypertensive heart and chronic kidney disease with heart failure and stage 1 through stage 4 chronic kidney disease, or unspecified chronic kidney disease: Secondary | ICD-10-CM | POA: Diagnosis not present

## 2018-11-23 DIAGNOSIS — Z8551 Personal history of malignant neoplasm of bladder: Secondary | ICD-10-CM | POA: Diagnosis not present

## 2018-11-23 DIAGNOSIS — S32009D Unspecified fracture of unspecified lumbar vertebra, subsequent encounter for fracture with routine healing: Secondary | ICD-10-CM | POA: Diagnosis not present

## 2018-11-23 DIAGNOSIS — Z7984 Long term (current) use of oral hypoglycemic drugs: Secondary | ICD-10-CM | POA: Diagnosis not present

## 2018-11-23 DIAGNOSIS — D696 Thrombocytopenia, unspecified: Secondary | ICD-10-CM | POA: Diagnosis not present

## 2018-11-23 DIAGNOSIS — J439 Emphysema, unspecified: Secondary | ICD-10-CM | POA: Diagnosis not present

## 2018-11-23 DIAGNOSIS — E1122 Type 2 diabetes mellitus with diabetic chronic kidney disease: Secondary | ICD-10-CM | POA: Diagnosis not present

## 2018-11-23 DIAGNOSIS — I5033 Acute on chronic diastolic (congestive) heart failure: Secondary | ICD-10-CM | POA: Diagnosis not present

## 2018-11-23 DIAGNOSIS — E039 Hypothyroidism, unspecified: Secondary | ICD-10-CM | POA: Diagnosis not present

## 2018-11-23 NOTE — Progress Notes (Signed)
Complete echocardiogram has been performed.  Jimmy Laurence Crofford RDCS, RVT 

## 2018-12-03 DIAGNOSIS — I5033 Acute on chronic diastolic (congestive) heart failure: Secondary | ICD-10-CM | POA: Diagnosis not present

## 2018-12-03 DIAGNOSIS — I130 Hypertensive heart and chronic kidney disease with heart failure and stage 1 through stage 4 chronic kidney disease, or unspecified chronic kidney disease: Secondary | ICD-10-CM | POA: Diagnosis not present

## 2018-12-03 DIAGNOSIS — S32415D Nondisplaced fracture of anterior wall of left acetabulum, subsequent encounter for fracture with routine healing: Secondary | ICD-10-CM | POA: Diagnosis not present

## 2018-12-03 DIAGNOSIS — S32302D Unspecified fracture of left ilium, subsequent encounter for fracture with routine healing: Secondary | ICD-10-CM | POA: Diagnosis not present

## 2018-12-03 DIAGNOSIS — M5416 Radiculopathy, lumbar region: Secondary | ICD-10-CM | POA: Diagnosis not present

## 2018-12-03 DIAGNOSIS — S32009D Unspecified fracture of unspecified lumbar vertebra, subsequent encounter for fracture with routine healing: Secondary | ICD-10-CM | POA: Diagnosis not present

## 2018-12-06 DIAGNOSIS — M5416 Radiculopathy, lumbar region: Secondary | ICD-10-CM | POA: Diagnosis not present

## 2018-12-06 DIAGNOSIS — I130 Hypertensive heart and chronic kidney disease with heart failure and stage 1 through stage 4 chronic kidney disease, or unspecified chronic kidney disease: Secondary | ICD-10-CM | POA: Diagnosis not present

## 2018-12-06 DIAGNOSIS — I5033 Acute on chronic diastolic (congestive) heart failure: Secondary | ICD-10-CM | POA: Diagnosis not present

## 2018-12-06 DIAGNOSIS — S32009D Unspecified fracture of unspecified lumbar vertebra, subsequent encounter for fracture with routine healing: Secondary | ICD-10-CM | POA: Diagnosis not present

## 2018-12-06 DIAGNOSIS — S32302D Unspecified fracture of left ilium, subsequent encounter for fracture with routine healing: Secondary | ICD-10-CM | POA: Diagnosis not present

## 2018-12-06 DIAGNOSIS — S32415D Nondisplaced fracture of anterior wall of left acetabulum, subsequent encounter for fracture with routine healing: Secondary | ICD-10-CM | POA: Diagnosis not present

## 2018-12-08 DIAGNOSIS — S32009D Unspecified fracture of unspecified lumbar vertebra, subsequent encounter for fracture with routine healing: Secondary | ICD-10-CM | POA: Diagnosis not present

## 2018-12-08 DIAGNOSIS — S32302D Unspecified fracture of left ilium, subsequent encounter for fracture with routine healing: Secondary | ICD-10-CM | POA: Diagnosis not present

## 2018-12-08 DIAGNOSIS — M5416 Radiculopathy, lumbar region: Secondary | ICD-10-CM | POA: Diagnosis not present

## 2018-12-08 DIAGNOSIS — I130 Hypertensive heart and chronic kidney disease with heart failure and stage 1 through stage 4 chronic kidney disease, or unspecified chronic kidney disease: Secondary | ICD-10-CM | POA: Diagnosis not present

## 2018-12-08 DIAGNOSIS — S32415D Nondisplaced fracture of anterior wall of left acetabulum, subsequent encounter for fracture with routine healing: Secondary | ICD-10-CM | POA: Diagnosis not present

## 2018-12-08 DIAGNOSIS — I5033 Acute on chronic diastolic (congestive) heart failure: Secondary | ICD-10-CM | POA: Diagnosis not present

## 2018-12-13 DIAGNOSIS — S32009D Unspecified fracture of unspecified lumbar vertebra, subsequent encounter for fracture with routine healing: Secondary | ICD-10-CM | POA: Diagnosis not present

## 2018-12-13 DIAGNOSIS — S32415D Nondisplaced fracture of anterior wall of left acetabulum, subsequent encounter for fracture with routine healing: Secondary | ICD-10-CM | POA: Diagnosis not present

## 2018-12-13 DIAGNOSIS — I5033 Acute on chronic diastolic (congestive) heart failure: Secondary | ICD-10-CM | POA: Diagnosis not present

## 2018-12-13 DIAGNOSIS — M5416 Radiculopathy, lumbar region: Secondary | ICD-10-CM | POA: Diagnosis not present

## 2018-12-13 DIAGNOSIS — S32302D Unspecified fracture of left ilium, subsequent encounter for fracture with routine healing: Secondary | ICD-10-CM | POA: Diagnosis not present

## 2018-12-13 DIAGNOSIS — I130 Hypertensive heart and chronic kidney disease with heart failure and stage 1 through stage 4 chronic kidney disease, or unspecified chronic kidney disease: Secondary | ICD-10-CM | POA: Diagnosis not present

## 2018-12-15 DIAGNOSIS — I130 Hypertensive heart and chronic kidney disease with heart failure and stage 1 through stage 4 chronic kidney disease, or unspecified chronic kidney disease: Secondary | ICD-10-CM | POA: Diagnosis not present

## 2018-12-15 DIAGNOSIS — S32302D Unspecified fracture of left ilium, subsequent encounter for fracture with routine healing: Secondary | ICD-10-CM | POA: Diagnosis not present

## 2018-12-15 DIAGNOSIS — S32415D Nondisplaced fracture of anterior wall of left acetabulum, subsequent encounter for fracture with routine healing: Secondary | ICD-10-CM | POA: Diagnosis not present

## 2018-12-15 DIAGNOSIS — S32009D Unspecified fracture of unspecified lumbar vertebra, subsequent encounter for fracture with routine healing: Secondary | ICD-10-CM | POA: Diagnosis not present

## 2018-12-15 DIAGNOSIS — I5033 Acute on chronic diastolic (congestive) heart failure: Secondary | ICD-10-CM | POA: Diagnosis not present

## 2018-12-15 DIAGNOSIS — M5416 Radiculopathy, lumbar region: Secondary | ICD-10-CM | POA: Diagnosis not present

## 2018-12-20 DIAGNOSIS — S32302D Unspecified fracture of left ilium, subsequent encounter for fracture with routine healing: Secondary | ICD-10-CM | POA: Diagnosis not present

## 2018-12-20 DIAGNOSIS — M5416 Radiculopathy, lumbar region: Secondary | ICD-10-CM | POA: Diagnosis not present

## 2018-12-20 DIAGNOSIS — S32415D Nondisplaced fracture of anterior wall of left acetabulum, subsequent encounter for fracture with routine healing: Secondary | ICD-10-CM | POA: Diagnosis not present

## 2018-12-20 DIAGNOSIS — I5033 Acute on chronic diastolic (congestive) heart failure: Secondary | ICD-10-CM | POA: Diagnosis not present

## 2018-12-20 DIAGNOSIS — I130 Hypertensive heart and chronic kidney disease with heart failure and stage 1 through stage 4 chronic kidney disease, or unspecified chronic kidney disease: Secondary | ICD-10-CM | POA: Diagnosis not present

## 2018-12-20 DIAGNOSIS — S32009D Unspecified fracture of unspecified lumbar vertebra, subsequent encounter for fracture with routine healing: Secondary | ICD-10-CM | POA: Diagnosis not present

## 2018-12-22 DIAGNOSIS — I5033 Acute on chronic diastolic (congestive) heart failure: Secondary | ICD-10-CM | POA: Diagnosis not present

## 2018-12-22 DIAGNOSIS — S32302D Unspecified fracture of left ilium, subsequent encounter for fracture with routine healing: Secondary | ICD-10-CM | POA: Diagnosis not present

## 2018-12-22 DIAGNOSIS — S32415D Nondisplaced fracture of anterior wall of left acetabulum, subsequent encounter for fracture with routine healing: Secondary | ICD-10-CM | POA: Diagnosis not present

## 2018-12-22 DIAGNOSIS — I130 Hypertensive heart and chronic kidney disease with heart failure and stage 1 through stage 4 chronic kidney disease, or unspecified chronic kidney disease: Secondary | ICD-10-CM | POA: Diagnosis not present

## 2018-12-22 DIAGNOSIS — M5416 Radiculopathy, lumbar region: Secondary | ICD-10-CM | POA: Diagnosis not present

## 2018-12-22 DIAGNOSIS — S32009D Unspecified fracture of unspecified lumbar vertebra, subsequent encounter for fracture with routine healing: Secondary | ICD-10-CM | POA: Diagnosis not present

## 2018-12-27 DIAGNOSIS — M5416 Radiculopathy, lumbar region: Secondary | ICD-10-CM | POA: Diagnosis not present

## 2018-12-27 DIAGNOSIS — S32009D Unspecified fracture of unspecified lumbar vertebra, subsequent encounter for fracture with routine healing: Secondary | ICD-10-CM | POA: Diagnosis not present

## 2018-12-27 DIAGNOSIS — I130 Hypertensive heart and chronic kidney disease with heart failure and stage 1 through stage 4 chronic kidney disease, or unspecified chronic kidney disease: Secondary | ICD-10-CM | POA: Diagnosis not present

## 2018-12-27 DIAGNOSIS — S32302D Unspecified fracture of left ilium, subsequent encounter for fracture with routine healing: Secondary | ICD-10-CM | POA: Diagnosis not present

## 2018-12-27 DIAGNOSIS — S32415D Nondisplaced fracture of anterior wall of left acetabulum, subsequent encounter for fracture with routine healing: Secondary | ICD-10-CM | POA: Diagnosis not present

## 2018-12-27 DIAGNOSIS — I5033 Acute on chronic diastolic (congestive) heart failure: Secondary | ICD-10-CM | POA: Diagnosis not present

## 2018-12-29 DIAGNOSIS — I130 Hypertensive heart and chronic kidney disease with heart failure and stage 1 through stage 4 chronic kidney disease, or unspecified chronic kidney disease: Secondary | ICD-10-CM | POA: Diagnosis not present

## 2018-12-29 DIAGNOSIS — S32302D Unspecified fracture of left ilium, subsequent encounter for fracture with routine healing: Secondary | ICD-10-CM | POA: Diagnosis not present

## 2018-12-29 DIAGNOSIS — S32009D Unspecified fracture of unspecified lumbar vertebra, subsequent encounter for fracture with routine healing: Secondary | ICD-10-CM | POA: Diagnosis not present

## 2018-12-29 DIAGNOSIS — M5416 Radiculopathy, lumbar region: Secondary | ICD-10-CM | POA: Diagnosis not present

## 2018-12-29 DIAGNOSIS — S32415D Nondisplaced fracture of anterior wall of left acetabulum, subsequent encounter for fracture with routine healing: Secondary | ICD-10-CM | POA: Diagnosis not present

## 2018-12-29 DIAGNOSIS — I5033 Acute on chronic diastolic (congestive) heart failure: Secondary | ICD-10-CM | POA: Diagnosis not present

## 2018-12-30 DIAGNOSIS — M25551 Pain in right hip: Secondary | ICD-10-CM | POA: Diagnosis not present

## 2018-12-30 DIAGNOSIS — M7061 Trochanteric bursitis, right hip: Secondary | ICD-10-CM | POA: Diagnosis not present

## 2018-12-30 DIAGNOSIS — M5416 Radiculopathy, lumbar region: Secondary | ICD-10-CM | POA: Diagnosis not present

## 2018-12-30 DIAGNOSIS — M549 Dorsalgia, unspecified: Secondary | ICD-10-CM | POA: Diagnosis not present

## 2018-12-30 DIAGNOSIS — Z96641 Presence of right artificial hip joint: Secondary | ICD-10-CM | POA: Diagnosis not present

## 2019-01-03 DIAGNOSIS — M5416 Radiculopathy, lumbar region: Secondary | ICD-10-CM | POA: Diagnosis not present

## 2019-01-03 DIAGNOSIS — I130 Hypertensive heart and chronic kidney disease with heart failure and stage 1 through stage 4 chronic kidney disease, or unspecified chronic kidney disease: Secondary | ICD-10-CM | POA: Diagnosis not present

## 2019-01-03 DIAGNOSIS — S32415D Nondisplaced fracture of anterior wall of left acetabulum, subsequent encounter for fracture with routine healing: Secondary | ICD-10-CM | POA: Diagnosis not present

## 2019-01-03 DIAGNOSIS — I5033 Acute on chronic diastolic (congestive) heart failure: Secondary | ICD-10-CM | POA: Diagnosis not present

## 2019-01-03 DIAGNOSIS — S32302D Unspecified fracture of left ilium, subsequent encounter for fracture with routine healing: Secondary | ICD-10-CM | POA: Diagnosis not present

## 2019-01-03 DIAGNOSIS — S32009D Unspecified fracture of unspecified lumbar vertebra, subsequent encounter for fracture with routine healing: Secondary | ICD-10-CM | POA: Diagnosis not present

## 2019-01-05 DIAGNOSIS — I130 Hypertensive heart and chronic kidney disease with heart failure and stage 1 through stage 4 chronic kidney disease, or unspecified chronic kidney disease: Secondary | ICD-10-CM | POA: Diagnosis not present

## 2019-01-05 DIAGNOSIS — S32302D Unspecified fracture of left ilium, subsequent encounter for fracture with routine healing: Secondary | ICD-10-CM | POA: Diagnosis not present

## 2019-01-05 DIAGNOSIS — S32415D Nondisplaced fracture of anterior wall of left acetabulum, subsequent encounter for fracture with routine healing: Secondary | ICD-10-CM | POA: Diagnosis not present

## 2019-01-05 DIAGNOSIS — M5416 Radiculopathy, lumbar region: Secondary | ICD-10-CM | POA: Diagnosis not present

## 2019-01-05 DIAGNOSIS — I5033 Acute on chronic diastolic (congestive) heart failure: Secondary | ICD-10-CM | POA: Diagnosis not present

## 2019-01-05 DIAGNOSIS — S32009D Unspecified fracture of unspecified lumbar vertebra, subsequent encounter for fracture with routine healing: Secondary | ICD-10-CM | POA: Diagnosis not present

## 2019-01-10 DIAGNOSIS — S32009D Unspecified fracture of unspecified lumbar vertebra, subsequent encounter for fracture with routine healing: Secondary | ICD-10-CM | POA: Diagnosis not present

## 2019-01-10 DIAGNOSIS — I130 Hypertensive heart and chronic kidney disease with heart failure and stage 1 through stage 4 chronic kidney disease, or unspecified chronic kidney disease: Secondary | ICD-10-CM | POA: Diagnosis not present

## 2019-01-10 DIAGNOSIS — M5416 Radiculopathy, lumbar region: Secondary | ICD-10-CM | POA: Diagnosis not present

## 2019-01-10 DIAGNOSIS — S32302D Unspecified fracture of left ilium, subsequent encounter for fracture with routine healing: Secondary | ICD-10-CM | POA: Diagnosis not present

## 2019-01-10 DIAGNOSIS — I5033 Acute on chronic diastolic (congestive) heart failure: Secondary | ICD-10-CM | POA: Diagnosis not present

## 2019-01-10 DIAGNOSIS — S32415D Nondisplaced fracture of anterior wall of left acetabulum, subsequent encounter for fracture with routine healing: Secondary | ICD-10-CM | POA: Diagnosis not present

## 2019-01-13 DIAGNOSIS — S32009D Unspecified fracture of unspecified lumbar vertebra, subsequent encounter for fracture with routine healing: Secondary | ICD-10-CM | POA: Diagnosis not present

## 2019-01-13 DIAGNOSIS — M5416 Radiculopathy, lumbar region: Secondary | ICD-10-CM | POA: Diagnosis not present

## 2019-01-13 DIAGNOSIS — I130 Hypertensive heart and chronic kidney disease with heart failure and stage 1 through stage 4 chronic kidney disease, or unspecified chronic kidney disease: Secondary | ICD-10-CM | POA: Diagnosis not present

## 2019-01-13 DIAGNOSIS — I5033 Acute on chronic diastolic (congestive) heart failure: Secondary | ICD-10-CM | POA: Diagnosis not present

## 2019-01-13 DIAGNOSIS — S32415D Nondisplaced fracture of anterior wall of left acetabulum, subsequent encounter for fracture with routine healing: Secondary | ICD-10-CM | POA: Diagnosis not present

## 2019-01-13 DIAGNOSIS — S32302D Unspecified fracture of left ilium, subsequent encounter for fracture with routine healing: Secondary | ICD-10-CM | POA: Diagnosis not present

## 2019-01-17 DIAGNOSIS — M5416 Radiculopathy, lumbar region: Secondary | ICD-10-CM | POA: Diagnosis not present

## 2019-01-17 DIAGNOSIS — S32415D Nondisplaced fracture of anterior wall of left acetabulum, subsequent encounter for fracture with routine healing: Secondary | ICD-10-CM | POA: Diagnosis not present

## 2019-01-17 DIAGNOSIS — S32009D Unspecified fracture of unspecified lumbar vertebra, subsequent encounter for fracture with routine healing: Secondary | ICD-10-CM | POA: Diagnosis not present

## 2019-01-17 DIAGNOSIS — S32302D Unspecified fracture of left ilium, subsequent encounter for fracture with routine healing: Secondary | ICD-10-CM | POA: Diagnosis not present

## 2019-01-17 DIAGNOSIS — I5033 Acute on chronic diastolic (congestive) heart failure: Secondary | ICD-10-CM | POA: Diagnosis not present

## 2019-01-17 DIAGNOSIS — I130 Hypertensive heart and chronic kidney disease with heart failure and stage 1 through stage 4 chronic kidney disease, or unspecified chronic kidney disease: Secondary | ICD-10-CM | POA: Diagnosis not present

## 2019-01-19 DIAGNOSIS — S32009D Unspecified fracture of unspecified lumbar vertebra, subsequent encounter for fracture with routine healing: Secondary | ICD-10-CM | POA: Diagnosis not present

## 2019-01-19 DIAGNOSIS — I5033 Acute on chronic diastolic (congestive) heart failure: Secondary | ICD-10-CM | POA: Diagnosis not present

## 2019-01-19 DIAGNOSIS — S32302D Unspecified fracture of left ilium, subsequent encounter for fracture with routine healing: Secondary | ICD-10-CM | POA: Diagnosis not present

## 2019-01-19 DIAGNOSIS — I130 Hypertensive heart and chronic kidney disease with heart failure and stage 1 through stage 4 chronic kidney disease, or unspecified chronic kidney disease: Secondary | ICD-10-CM | POA: Diagnosis not present

## 2019-01-19 DIAGNOSIS — S32415D Nondisplaced fracture of anterior wall of left acetabulum, subsequent encounter for fracture with routine healing: Secondary | ICD-10-CM | POA: Diagnosis not present

## 2019-01-19 DIAGNOSIS — M5416 Radiculopathy, lumbar region: Secondary | ICD-10-CM | POA: Diagnosis not present

## 2019-01-25 DIAGNOSIS — M5416 Radiculopathy, lumbar region: Secondary | ICD-10-CM | POA: Diagnosis not present

## 2019-01-26 DIAGNOSIS — H02109 Unspecified ectropion of unspecified eye, unspecified eyelid: Secondary | ICD-10-CM | POA: Diagnosis not present

## 2019-02-01 DIAGNOSIS — M9702XD Periprosthetic fracture around internal prosthetic left hip joint, subsequent encounter: Secondary | ICD-10-CM | POA: Diagnosis not present

## 2019-02-01 DIAGNOSIS — N189 Chronic kidney disease, unspecified: Secondary | ICD-10-CM | POA: Diagnosis not present

## 2019-02-01 DIAGNOSIS — M25551 Pain in right hip: Secondary | ICD-10-CM | POA: Diagnosis not present

## 2019-02-01 DIAGNOSIS — I5030 Unspecified diastolic (congestive) heart failure: Secondary | ICD-10-CM | POA: Diagnosis not present

## 2019-02-01 DIAGNOSIS — I2510 Atherosclerotic heart disease of native coronary artery without angina pectoris: Secondary | ICD-10-CM | POA: Diagnosis not present

## 2019-02-01 DIAGNOSIS — S8991XA Unspecified injury of right lower leg, initial encounter: Secondary | ICD-10-CM | POA: Diagnosis not present

## 2019-02-01 DIAGNOSIS — S72144D Nondisplaced intertrochanteric fracture of right femur, subsequent encounter for closed fracture with routine healing: Secondary | ICD-10-CM | POA: Diagnosis not present

## 2019-02-01 DIAGNOSIS — S72324A Nondisplaced transverse fracture of shaft of right femur, initial encounter for closed fracture: Secondary | ICD-10-CM | POA: Diagnosis not present

## 2019-02-01 DIAGNOSIS — W19XXXA Unspecified fall, initial encounter: Secondary | ICD-10-CM | POA: Diagnosis not present

## 2019-02-01 DIAGNOSIS — I252 Old myocardial infarction: Secondary | ICD-10-CM | POA: Diagnosis not present

## 2019-02-01 DIAGNOSIS — Z87891 Personal history of nicotine dependence: Secondary | ICD-10-CM | POA: Diagnosis not present

## 2019-02-01 DIAGNOSIS — E039 Hypothyroidism, unspecified: Secondary | ICD-10-CM | POA: Diagnosis present

## 2019-02-01 DIAGNOSIS — Z7401 Bed confinement status: Secondary | ICD-10-CM | POA: Diagnosis not present

## 2019-02-01 DIAGNOSIS — K219 Gastro-esophageal reflux disease without esophagitis: Secondary | ICD-10-CM | POA: Diagnosis present

## 2019-02-01 DIAGNOSIS — W19XXXD Unspecified fall, subsequent encounter: Secondary | ICD-10-CM | POA: Diagnosis not present

## 2019-02-01 DIAGNOSIS — R262 Difficulty in walking, not elsewhere classified: Secondary | ICD-10-CM | POA: Diagnosis present

## 2019-02-01 DIAGNOSIS — J441 Chronic obstructive pulmonary disease with (acute) exacerbation: Secondary | ICD-10-CM | POA: Diagnosis not present

## 2019-02-01 DIAGNOSIS — H409 Unspecified glaucoma: Secondary | ICD-10-CM | POA: Diagnosis present

## 2019-02-01 DIAGNOSIS — E119 Type 2 diabetes mellitus without complications: Secondary | ICD-10-CM | POA: Diagnosis not present

## 2019-02-01 DIAGNOSIS — J449 Chronic obstructive pulmonary disease, unspecified: Secondary | ICD-10-CM | POA: Diagnosis not present

## 2019-02-01 DIAGNOSIS — S299XXA Unspecified injury of thorax, initial encounter: Secondary | ICD-10-CM | POA: Diagnosis not present

## 2019-02-01 DIAGNOSIS — E1122 Type 2 diabetes mellitus with diabetic chronic kidney disease: Secondary | ICD-10-CM | POA: Diagnosis present

## 2019-02-01 DIAGNOSIS — R0902 Hypoxemia: Secondary | ICD-10-CM | POA: Diagnosis not present

## 2019-02-01 DIAGNOSIS — S32591A Other specified fracture of right pubis, initial encounter for closed fracture: Secondary | ICD-10-CM | POA: Diagnosis not present

## 2019-02-01 DIAGNOSIS — M9701XA Periprosthetic fracture around internal prosthetic right hip joint, initial encounter: Secondary | ICD-10-CM | POA: Diagnosis not present

## 2019-02-01 DIAGNOSIS — Z9181 History of falling: Secondary | ICD-10-CM | POA: Diagnosis not present

## 2019-02-01 DIAGNOSIS — I4891 Unspecified atrial fibrillation: Secondary | ICD-10-CM | POA: Diagnosis not present

## 2019-02-01 DIAGNOSIS — S3210XD Unspecified fracture of sacrum, subsequent encounter for fracture with routine healing: Secondary | ICD-10-CM | POA: Diagnosis not present

## 2019-02-01 DIAGNOSIS — Z9981 Dependence on supplemental oxygen: Secondary | ICD-10-CM | POA: Diagnosis not present

## 2019-02-01 DIAGNOSIS — R52 Pain, unspecified: Secondary | ICD-10-CM | POA: Diagnosis not present

## 2019-02-01 DIAGNOSIS — I4510 Unspecified right bundle-branch block: Secondary | ICD-10-CM | POA: Diagnosis present

## 2019-02-01 DIAGNOSIS — I130 Hypertensive heart and chronic kidney disease with heart failure and stage 1 through stage 4 chronic kidney disease, or unspecified chronic kidney disease: Secondary | ICD-10-CM | POA: Diagnosis present

## 2019-02-01 DIAGNOSIS — R4182 Altered mental status, unspecified: Secondary | ICD-10-CM | POA: Diagnosis not present

## 2019-02-01 DIAGNOSIS — Z96641 Presence of right artificial hip joint: Secondary | ICD-10-CM | POA: Diagnosis not present

## 2019-02-01 DIAGNOSIS — M25561 Pain in right knee: Secondary | ICD-10-CM | POA: Diagnosis not present

## 2019-02-01 DIAGNOSIS — Z951 Presence of aortocoronary bypass graft: Secondary | ICD-10-CM | POA: Diagnosis not present

## 2019-02-01 DIAGNOSIS — R51 Headache: Secondary | ICD-10-CM | POA: Diagnosis not present

## 2019-02-01 DIAGNOSIS — S72044A Nondisplaced fracture of base of neck of right femur, initial encounter for closed fracture: Secondary | ICD-10-CM | POA: Diagnosis not present

## 2019-02-01 DIAGNOSIS — I25810 Atherosclerosis of coronary artery bypass graft(s) without angina pectoris: Secondary | ICD-10-CM | POA: Diagnosis not present

## 2019-02-01 DIAGNOSIS — S0990XA Unspecified injury of head, initial encounter: Secondary | ICD-10-CM | POA: Diagnosis not present

## 2019-02-01 DIAGNOSIS — S32591D Other specified fracture of right pubis, subsequent encounter for fracture with routine healing: Secondary | ICD-10-CM | POA: Diagnosis not present

## 2019-02-01 DIAGNOSIS — M2550 Pain in unspecified joint: Secondary | ICD-10-CM | POA: Diagnosis not present

## 2019-02-01 DIAGNOSIS — Z7901 Long term (current) use of anticoagulants: Secondary | ICD-10-CM | POA: Diagnosis not present

## 2019-02-01 DIAGNOSIS — Z79899 Other long term (current) drug therapy: Secondary | ICD-10-CM | POA: Diagnosis not present

## 2019-02-01 DIAGNOSIS — N400 Enlarged prostate without lower urinary tract symptoms: Secondary | ICD-10-CM | POA: Diagnosis present

## 2019-02-01 DIAGNOSIS — S72001A Fracture of unspecified part of neck of right femur, initial encounter for closed fracture: Secondary | ICD-10-CM | POA: Diagnosis present

## 2019-02-01 DIAGNOSIS — Z8551 Personal history of malignant neoplasm of bladder: Secondary | ICD-10-CM | POA: Diagnosis not present

## 2019-02-01 DIAGNOSIS — I5032 Chronic diastolic (congestive) heart failure: Secondary | ICD-10-CM | POA: Diagnosis present

## 2019-02-01 DIAGNOSIS — S7291XA Unspecified fracture of right femur, initial encounter for closed fracture: Secondary | ICD-10-CM | POA: Diagnosis not present

## 2019-02-01 DIAGNOSIS — Z794 Long term (current) use of insulin: Secondary | ICD-10-CM | POA: Diagnosis not present

## 2019-02-01 DIAGNOSIS — N183 Chronic kidney disease, stage 3 (moderate): Secondary | ICD-10-CM | POA: Diagnosis not present

## 2019-02-01 DIAGNOSIS — S199XXA Unspecified injury of neck, initial encounter: Secondary | ICD-10-CM | POA: Diagnosis not present

## 2019-02-01 DIAGNOSIS — S32511A Fracture of superior rim of right pubis, initial encounter for closed fracture: Secondary | ICD-10-CM | POA: Diagnosis not present

## 2019-02-04 DIAGNOSIS — M25551 Pain in right hip: Secondary | ICD-10-CM | POA: Diagnosis not present

## 2019-02-04 DIAGNOSIS — W19XXXD Unspecified fall, subsequent encounter: Secondary | ICD-10-CM | POA: Diagnosis not present

## 2019-02-04 DIAGNOSIS — M2550 Pain in unspecified joint: Secondary | ICD-10-CM | POA: Diagnosis not present

## 2019-02-04 DIAGNOSIS — S7291XA Unspecified fracture of right femur, initial encounter for closed fracture: Secondary | ICD-10-CM | POA: Diagnosis not present

## 2019-02-04 DIAGNOSIS — S72144D Nondisplaced intertrochanteric fracture of right femur, subsequent encounter for closed fracture with routine healing: Secondary | ICD-10-CM | POA: Diagnosis not present

## 2019-02-04 DIAGNOSIS — H409 Unspecified glaucoma: Secondary | ICD-10-CM | POA: Diagnosis not present

## 2019-02-04 DIAGNOSIS — M9702XD Periprosthetic fracture around internal prosthetic left hip joint, subsequent encounter: Secondary | ICD-10-CM | POA: Diagnosis not present

## 2019-02-04 DIAGNOSIS — N400 Enlarged prostate without lower urinary tract symptoms: Secondary | ICD-10-CM | POA: Diagnosis not present

## 2019-02-04 DIAGNOSIS — N183 Chronic kidney disease, stage 3 (moderate): Secondary | ICD-10-CM | POA: Diagnosis not present

## 2019-02-04 DIAGNOSIS — I25810 Atherosclerosis of coronary artery bypass graft(s) without angina pectoris: Secondary | ICD-10-CM | POA: Diagnosis not present

## 2019-02-04 DIAGNOSIS — W19XXXA Unspecified fall, initial encounter: Secondary | ICD-10-CM | POA: Diagnosis not present

## 2019-02-04 DIAGNOSIS — I4891 Unspecified atrial fibrillation: Secondary | ICD-10-CM | POA: Diagnosis not present

## 2019-02-04 DIAGNOSIS — M810 Age-related osteoporosis without current pathological fracture: Secondary | ICD-10-CM | POA: Diagnosis not present

## 2019-02-04 DIAGNOSIS — Z8551 Personal history of malignant neoplasm of bladder: Secondary | ICD-10-CM | POA: Diagnosis not present

## 2019-02-04 DIAGNOSIS — E119 Type 2 diabetes mellitus without complications: Secondary | ICD-10-CM | POA: Diagnosis not present

## 2019-02-04 DIAGNOSIS — N189 Chronic kidney disease, unspecified: Secondary | ICD-10-CM | POA: Diagnosis not present

## 2019-02-04 DIAGNOSIS — J449 Chronic obstructive pulmonary disease, unspecified: Secondary | ICD-10-CM | POA: Diagnosis not present

## 2019-02-04 DIAGNOSIS — R0902 Hypoxemia: Secondary | ICD-10-CM | POA: Diagnosis not present

## 2019-02-04 DIAGNOSIS — S3210XD Unspecified fracture of sacrum, subsequent encounter for fracture with routine healing: Secondary | ICD-10-CM | POA: Diagnosis not present

## 2019-02-04 DIAGNOSIS — I509 Heart failure, unspecified: Secondary | ICD-10-CM | POA: Diagnosis not present

## 2019-02-04 DIAGNOSIS — S32591D Other specified fracture of right pubis, subsequent encounter for fracture with routine healing: Secondary | ICD-10-CM | POA: Diagnosis not present

## 2019-02-04 DIAGNOSIS — Z794 Long term (current) use of insulin: Secondary | ICD-10-CM | POA: Diagnosis not present

## 2019-02-04 DIAGNOSIS — I2510 Atherosclerotic heart disease of native coronary artery without angina pectoris: Secondary | ICD-10-CM | POA: Diagnosis not present

## 2019-02-04 DIAGNOSIS — E039 Hypothyroidism, unspecified: Secondary | ICD-10-CM | POA: Diagnosis not present

## 2019-02-04 DIAGNOSIS — S32501D Unspecified fracture of right pubis, subsequent encounter for fracture with routine healing: Secondary | ICD-10-CM | POA: Diagnosis not present

## 2019-02-04 DIAGNOSIS — I5030 Unspecified diastolic (congestive) heart failure: Secondary | ICD-10-CM | POA: Diagnosis not present

## 2019-02-04 DIAGNOSIS — Z7401 Bed confinement status: Secondary | ICD-10-CM | POA: Diagnosis not present

## 2019-02-04 DIAGNOSIS — R262 Difficulty in walking, not elsewhere classified: Secondary | ICD-10-CM | POA: Diagnosis not present

## 2019-02-06 DIAGNOSIS — R262 Difficulty in walking, not elsewhere classified: Secondary | ICD-10-CM | POA: Diagnosis not present

## 2019-02-06 DIAGNOSIS — S32501D Unspecified fracture of right pubis, subsequent encounter for fracture with routine healing: Secondary | ICD-10-CM | POA: Diagnosis not present

## 2019-02-06 DIAGNOSIS — I509 Heart failure, unspecified: Secondary | ICD-10-CM | POA: Diagnosis not present

## 2019-02-06 DIAGNOSIS — M810 Age-related osteoporosis without current pathological fracture: Secondary | ICD-10-CM | POA: Diagnosis not present

## 2019-02-16 ENCOUNTER — Ambulatory Visit: Payer: Medicare Other | Admitting: Cardiology

## 2019-02-18 ENCOUNTER — Other Ambulatory Visit: Payer: Self-pay | Admitting: *Deleted

## 2019-02-18 NOTE — Patient Outreach (Signed)
[facility]) Care Management  02/18/2019  Brad [name] [birth year] [id number]   Collaboration with SNF facility for potential [place] Management needs. Discharge planner from [place] recommends [place] Management follow up. [name] slated for discharge home on 02/18/19.  Telephone call to [name] to discuss [place] Management services after his discharge from SNF. [name] pleasantly declines [place] Management services. Denies having any disease management, medication, transportation, or community resource needs. [name] states "my son is a medical doctor, he looks after me." Expressed appreciation of the call.  No further needs.     Marthenia Rolling, MSN-Ed, RN,BSN [place] Acute Care Coordinator  [phone]

## 2019-02-28 DIAGNOSIS — Z09 Encounter for follow-up examination after completed treatment for conditions other than malignant neoplasm: Secondary | ICD-10-CM | POA: Diagnosis not present

## 2019-02-28 DIAGNOSIS — S7291XA Unspecified fracture of right femur, initial encounter for closed fracture: Secondary | ICD-10-CM | POA: Diagnosis not present

## 2019-02-28 DIAGNOSIS — I509 Heart failure, unspecified: Secondary | ICD-10-CM | POA: Diagnosis not present

## 2019-02-28 DIAGNOSIS — J439 Emphysema, unspecified: Secondary | ICD-10-CM | POA: Diagnosis not present

## 2019-03-24 DIAGNOSIS — L570 Actinic keratosis: Secondary | ICD-10-CM | POA: Diagnosis not present

## 2019-03-24 DIAGNOSIS — L578 Other skin changes due to chronic exposure to nonionizing radiation: Secondary | ICD-10-CM | POA: Diagnosis not present

## 2019-03-24 DIAGNOSIS — C44719 Basal cell carcinoma of skin of left lower limb, including hip: Secondary | ICD-10-CM | POA: Diagnosis not present

## 2019-03-24 DIAGNOSIS — L853 Xerosis cutis: Secondary | ICD-10-CM | POA: Diagnosis not present

## 2019-03-30 ENCOUNTER — Telehealth: Payer: Self-pay | Admitting: *Deleted

## 2019-03-30 MED ORDER — ELIQUIS 2.5 MG PO TABS: 2.5000 mg | ORAL_TABLET | Freq: Two times a day (BID) | ORAL | 2 refills | Status: DC

## 2019-03-30 NOTE — Telephone Encounter (Signed)
Rx refill sent to pharmacy. 

## 2019-03-30 NOTE — Telephone Encounter (Signed)
*  STAT* If patient is at the pharmacy, call can be transferred to refill team.   1. Which medications need to be refilled? (please list name of each medication and dose if known) Eliquis 2.5 bid  2. Which pharmacy/location (including street and city if local pharmacy) is medication to be sent to?[place]  3. Do they need a 30 day or 90 day supply? [place]

## 2019-04-06 DIAGNOSIS — Z8551 Personal history of malignant neoplasm of bladder: Secondary | ICD-10-CM | POA: Diagnosis not present

## 2019-04-06 DIAGNOSIS — Z125 Encounter for screening for malignant neoplasm of prostate: Secondary | ICD-10-CM | POA: Diagnosis not present

## 2019-04-06 DIAGNOSIS — N289 Disorder of kidney and ureter, unspecified: Secondary | ICD-10-CM | POA: Diagnosis not present

## 2019-04-06 DIAGNOSIS — C672 Malignant neoplasm of lateral wall of bladder: Secondary | ICD-10-CM | POA: Diagnosis not present

## 2019-04-07 ENCOUNTER — Other Ambulatory Visit: Payer: Self-pay | Admitting: *Deleted

## 2019-04-07 ENCOUNTER — Encounter: Payer: Self-pay | Admitting: *Deleted

## 2019-04-07 NOTE — Patient Outreach (Signed)
[facility]) Care Management [place] Screening call- insurance referral 04/07/2019  Brad [name] [birth year] [id number]  Successful outreach to patient; explained to patient the purpose of my call to him today, and provided him with my name and credentials.  Patient refused to provide HIPAA identifiers stating that he does not provide these over the phone "to anyone." Re-explained the purpose of my call today, as well as need for him to verify identifiers to protect his privacy; patient eventually provided his date of birth, but continued to decline providing additional identifiers, as required to proceed with call.  Patient stated that if his insurance company wanted [facility] CM to contact him, "they should have contacted" him first to inform him to expect the call.  He further stated that his son is a medical doctor and his wife is a Software engineer, and that they "take care of everything" for him.  Patient states he has no needs, and is "doing great" for "an old man."  He further stated that he "had a doctor's appointment yesterday and got a great report."  Discussed with patient that I could put a letter in the mail to him describing [place] services, and although he agrees to this, he again refuses to verify his address- he stated that "if you're calling on behalf of the insurance company, you should already have that information."  I again re-iterated need to verify address/ name with him for privacy reasons, however, patient refused to do so.  Patient stated he wanted me to put the letter in the mail to the address on file, and he would contact [facility] CM himself if he was interested in services; he requested that he not be contacted again, stating that he would prefer to "be the one to do the contacting."   Plan:  Will send patient letter describing [place] services as he has requested  Oneta Rack, RN, BSN, [place] Coordinator [facility] Care Management  [phone]

## 2019-05-06 ENCOUNTER — Other Ambulatory Visit: Payer: Self-pay

## 2019-05-06 MED ORDER — METOPROLOL TARTRATE 25 MG PO TABS: 37.5000 mg | ORAL_TABLET | Freq: Two times a day (BID) | ORAL | 1 refills | Status: DC

## 2019-05-11 ENCOUNTER — Other Ambulatory Visit: Payer: Self-pay

## 2019-05-11 ENCOUNTER — Telehealth: Payer: Self-pay

## 2019-05-11 MED ORDER — METOPROLOL TARTRATE 25 MG PO TABS: 37.5000 mg | ORAL_TABLET | Freq: Two times a day (BID) | ORAL | 1 refills | Status: DC

## 2019-05-11 NOTE — Telephone Encounter (Signed)
RN called patient and received phone consent for telemedicine visit. Patient instructed to have home BP cuff available, weigh himself and all medication out for reconciliation/refills. Patient scheduled for virtual visit and had no further questions. Refill for metoprolol sent to requested pharmacy.

## 2019-05-16 ENCOUNTER — Telehealth (INDEPENDENT_AMBULATORY_CARE_PROVIDER_SITE_OTHER): Payer: Medicare Other | Admitting: Cardiology

## 2019-05-16 ENCOUNTER — Other Ambulatory Visit: Payer: Self-pay

## 2019-05-16 ENCOUNTER — Encounter: Payer: Self-pay | Admitting: Cardiology

## 2019-05-16 VITALS — BP 120/80 | HR 73 | Temp 97.6°F | Wt 166.0 lb

## 2019-05-16 DIAGNOSIS — Z951 Presence of aortocoronary bypass graft: Secondary | ICD-10-CM

## 2019-05-16 DIAGNOSIS — C679 Malignant neoplasm of bladder, unspecified: Secondary | ICD-10-CM

## 2019-05-16 DIAGNOSIS — I739 Peripheral vascular disease, unspecified: Secondary | ICD-10-CM

## 2019-05-16 DIAGNOSIS — I2510 Atherosclerotic heart disease of native coronary artery without angina pectoris: Secondary | ICD-10-CM | POA: Diagnosis not present

## 2019-05-16 DIAGNOSIS — I4821 Permanent atrial fibrillation: Secondary | ICD-10-CM

## 2019-05-16 NOTE — Progress Notes (Signed)
Virtual Visit via Telephone Note   This visit type was conducted due to national recommendations for restrictions regarding the COVID-19 Pandemic (e.g. social distancing) in an effort to limit this patient's exposure and mitigate transmission in our community.  Due to his co-morbid illnesses, this patient is at least at moderate risk for complications without adequate follow up.  This format is felt to be most appropriate for this patient at this time.  The patient did not have access to video technology/had technical difficulties with video requiring transitioning to audio format only (telephone).  All issues noted in this document were discussed and addressed.  No physical exam could be performed with this format.  Please refer to the patient's chart for his  consent to telehealth for [facility].  Evaluation Performed:  Follow-up visit  This visit type was conducted due to national recommendations for restrictions regarding the COVID-19 Pandemic (e.g. social distancing).  This format is felt to be most appropriate for this patient at this time.  All issues noted in this document were discussed and addressed.  No physical exam was performed (except for noted visual exam findings with Video Visits).  Please refer to the patient's chart (MyChart message for video visits and phone note for telephone visits) for the patient's consent to telehealth for [facility].  Date:  05/16/2019  ID: Brad [name], DOB 05/20/1931, MRN [id number]   Patient Location: [place] [address]   Provider location:   [place] Office  PCP:  Helen Hashimoto., MD  Cardiologist:  Jenne Campus, MD     Chief Complaint: Doing well  History of Present Illness:    Brad [name] is a 85 y.o. male  who presents via audio/video conferencing for a telehealth visit today.  Past medical history significant for coronary artery disease, status post coronary bypass graft, chronic  bronchitis, diabetes, hyperlipidemia, overall he is deteriorating and eventually ended up falling down and breaking his pelvis and then going to nursing home for few weeks but now he is back home and he is happy he is telling me that he is getting stronger he is able to walk with a walker.   The patient does not have symptoms concerning for COVID-19 infection (fever, chills, cough, or new SHORTNESS OF BREATH).    Prior CV studies:   The following studies were reviewed today:       Past Medical History:  Diagnosis Date  . Allergic rhinitis   . Chronic airflow obstruction (HCC)   . Coronary atherosclerosis   . DJD (degenerative joint disease)   . DM (diabetes mellitus) type II controlled peripheral vascular disorder   . GERD (gastroesophageal reflux disease)   . Hyperlipidemia   . Hypertension   . Osteoarthritis   . Renal insufficiency     No past surgical history on file.   Current Meds  Medication Sig  . dorzolamide-timolol (COSOPT) 22.3-6.8 MG/ML ophthalmic solution Place 1 drop into both eyes daily.  Marland Kitchen ELIQUIS 2.5 MG TABS tablet Take 1 tablet (2.5 mg total) by mouth 2 (two) times daily. Pt needs office visit before further refills  . famotidine (PEPCID) 20 MG tablet Take 1 tablet by mouth 2 (two) times daily.  . Fluticasone-Salmeterol (ADVAIR) 250-50 MCG/DOSE AEPB Inhale 1 puff into the lungs every 12 (twelve) hours.  . Ipratropium-Albuterol (COMBIVENT RESPIMAT) 20-100 MCG/ACT AERS respimat Inhale 1 puff into the lungs 4 (four) times daily as needed.  Marland Kitchen JANUVIA 50 MG  tablet Take 50 mg by mouth daily.  Marland Kitchen latanoprost (XALATAN) 0.005 % ophthalmic solution Place 1 drop into both eyes daily.  Marland Kitchen levothyroxine (SYNTHROID, LEVOTHROID) 25 MCG tablet Take 1 tablet by mouth daily.  . metoprolol tartrate (LOPRESSOR) 25 MG tablet Take 1.5 tablets (37.5 mg total) by mouth 2 (two) times daily.  . Omega-3 Fatty Acids (FISH OIL) 1200 MG CAPS Take 1 capsule by mouth daily.  Marland Kitchen oxybutynin  (DITROPAN) 5 MG tablet Take 1 tablet by mouth 2 (two) times daily.  . ramipril (ALTACE) 10 MG capsule Take 10 mg by mouth daily.  . ranolazine (RANEXA) 500 MG 12 hr tablet Take 500 mg by mouth 2 (two) times daily.  . SYMBICORT 160-4.5 MCG/ACT inhaler Inhale 2 puffs into the lungs daily.  . tamsulosin (FLOMAX) 0.4 MG CAPS capsule Take 1 capsule by mouth 2 (two) times daily.  . traMADol (ULTRAM) 50 MG tablet Take 50 mg by mouth as needed.      Family History: The patient's family history is not on file.   ROS:   Please see the history of present illness.     All other systems reviewed and are negative.   Labs/Other Tests and Data Reviewed:     Recent Labs: No results found for requested labs within last 8760 hours.  Recent Lipid Panel No results found for: CHOL, TRIG, HDL, CHOLHDL, VLDL, LDLCALC, LDLDIRECT    Exam:    Vital Signs:  BP 120/80   Pulse 73   Temp 97.6 F (36.4 C)   Wt 166 lb (75.3 kg)   SpO2 95%   BMI 26.79 kg/m     Wt Readings from Last 3 Encounters:  05/16/19 166 lb (75.3 kg)  11/11/18 171 lb (77.6 kg)  10/11/18 170 lb (77.1 kg)     Well nourished, well developed in no acute distress. Alert awake oriented x3 with talking over the phone.  He was unable to establish video link.  Diagnosis for this visit:   1. Coronary artery disease involving native coronary artery of native heart without angina pectoris   2. Permanent atrial fibrillation   3. Peripheral vascular disease ([place])   4. Malignant neoplasm of urinary bladder, unspecified site ([place])   5. History of coronary artery bypass graft      ASSESSMENT & PLAN:    1.  Coronary disease stable from that point review denies having the symptoms. 2.  Permanent atrial fibrillation.  Rate controlled.  He cut down his metoprolol because he was unable to cut the pill in half so he takes now 25 twice daily and feels good.  We will continue at that dose.  Will continue with anticoagulation. 3.  Peripheral  vascular disease stable without any symptoms. 3.  Malignant neoplasm of his bladder urinary followed by urology  COVID-19 Education: The signs and symptoms of COVID-19 were discussed with the patient and how to seek care for testing (follow up with PCP or arrange E-visit).  The importance of social distancing was discussed today.  Patient Risk:   After full review of this patients clinical status, I feel that they are at least moderate risk at this time.  Time:   Today, I have spent 16 minutes with the patient with telehealth technology discussing pt health issues.  I spent 5 minutes reviewing her chart before the visit.  Visit was finished at 3:50 PM.    Medication Adjustments/Labs and Tests Ordered: Current medicines are reviewed at length with the patient today.  Concerns regarding medicines are outlined above.  No orders of the defined types were placed in this encounter.  Medication changes: No orders of the defined types were placed in this encounter.    Disposition: Follow-up in 4 months  Signed, Park Liter, MD, [facility] 05/16/2019 3:51 PM    [facility]

## 2019-05-16 NOTE — Patient Instructions (Signed)
Medication Instructions:  Your physician recommends that you continue on your current medications as directed. Please refer to the Current Medication list given to you today.  If you need a refill on your cardiac medications before your next appointment, please call your pharmacy.   Lab work: None.  If you have labs (blood work) drawn today and your tests are completely normal, you will receive your results only by: . MyChart Message (if you have MyChart) OR . A paper copy in the mail If you have any lab test that is abnormal or we need to change your treatment, we will call you to review the results.  Testing/Procedures: None.   Follow-Up: At [facility], you and your health needs are our priority.  As part of our continuing mission to provide you with exceptional heart care, we have created designated Provider Care Teams.  These Care Teams include your primary Cardiologist (physician) and Advanced Practice Providers (APPs -  Physician Assistants and Nurse Practitioners) who all work together to provide you with the care you need, when you need it. You will need a follow up appointment in 4 months.  Please call our office 2 months in advance to schedule this appointment.  You may see Robert Krasowski, MD or another member of our [facility] Provider Team in [place]: Brian Munley, MD . Rajan Revankar, MD  Any Other Special Instructions Will Be Listed Below (If Applicable).     

## 2019-06-28 DIAGNOSIS — E785 Hyperlipidemia, unspecified: Secondary | ICD-10-CM | POA: Diagnosis not present

## 2019-06-28 DIAGNOSIS — Z1331 Encounter for screening for depression: Secondary | ICD-10-CM | POA: Diagnosis not present

## 2019-06-28 DIAGNOSIS — Z9181 History of falling: Secondary | ICD-10-CM | POA: Diagnosis not present

## 2019-06-28 DIAGNOSIS — Z139 Encounter for screening, unspecified: Secondary | ICD-10-CM | POA: Diagnosis not present

## 2019-06-28 DIAGNOSIS — Z0000 Encounter for general adult medical examination without abnormal findings: Secondary | ICD-10-CM | POA: Diagnosis not present

## 2019-06-30 ENCOUNTER — Other Ambulatory Visit: Payer: Self-pay | Admitting: Cardiology

## 2019-06-30 ENCOUNTER — Telehealth: Payer: Self-pay | Admitting: Cardiology

## 2019-06-30 MED ORDER — RANOLAZINE ER 500 MG PO TB12: 500.0000 mg | ORAL_TABLET | Freq: Two times a day (BID) | ORAL | 1 refills | Status: DC

## 2019-06-30 NOTE — Telephone Encounter (Signed)
Ranexa 500 mg twice daily refilled.

## 2019-06-30 NOTE — Telephone Encounter (Signed)
°*  STAT* If patient is at the pharmacy, call can be transferred to refill team.   1. Which medications need to be refilled? (please list name of each medication and dose if known) Ranexa 500mg  12 hr tablet  2. Which pharmacy/location (including street and city if local pharmacy) is medication to be sent to? [facility]   3. Do they need a 30 day or 90 day supply? [place]

## 2019-07-26 DIAGNOSIS — M5416 Radiculopathy, lumbar region: Secondary | ICD-10-CM | POA: Diagnosis not present

## 2019-08-11 DIAGNOSIS — N401 Enlarged prostate with lower urinary tract symptoms: Secondary | ICD-10-CM | POA: Diagnosis not present

## 2019-08-11 DIAGNOSIS — R8290 Unspecified abnormal findings in urine: Secondary | ICD-10-CM | POA: Diagnosis not present

## 2019-08-11 DIAGNOSIS — C672 Malignant neoplasm of lateral wall of bladder: Secondary | ICD-10-CM | POA: Diagnosis not present

## 2019-08-11 DIAGNOSIS — B3789 Other sites of candidiasis: Secondary | ICD-10-CM | POA: Diagnosis not present

## 2019-08-26 DIAGNOSIS — Z23 Encounter for immunization: Secondary | ICD-10-CM | POA: Diagnosis not present

## 2019-09-13 DIAGNOSIS — L578 Other skin changes due to chronic exposure to nonionizing radiation: Secondary | ICD-10-CM | POA: Diagnosis not present

## 2019-09-13 DIAGNOSIS — L821 Other seborrheic keratosis: Secondary | ICD-10-CM | POA: Diagnosis not present

## 2019-09-13 DIAGNOSIS — L570 Actinic keratosis: Secondary | ICD-10-CM | POA: Diagnosis not present

## 2019-09-20 ENCOUNTER — Other Ambulatory Visit: Payer: Self-pay

## 2019-09-20 ENCOUNTER — Encounter: Payer: Self-pay | Admitting: Cardiology

## 2019-09-20 ENCOUNTER — Ambulatory Visit (INDEPENDENT_AMBULATORY_CARE_PROVIDER_SITE_OTHER): Payer: Medicare Other | Admitting: Cardiology

## 2019-09-20 VITALS — BP 120/60 | HR 89 | Wt 166.0 lb

## 2019-09-20 DIAGNOSIS — I4821 Permanent atrial fibrillation: Secondary | ICD-10-CM | POA: Diagnosis not present

## 2019-09-20 DIAGNOSIS — Z951 Presence of aortocoronary bypass graft: Secondary | ICD-10-CM | POA: Diagnosis not present

## 2019-09-20 DIAGNOSIS — I2510 Atherosclerotic heart disease of native coronary artery without angina pectoris: Secondary | ICD-10-CM

## 2019-09-20 DIAGNOSIS — I739 Peripheral vascular disease, unspecified: Secondary | ICD-10-CM | POA: Diagnosis not present

## 2019-09-20 NOTE — Patient Instructions (Signed)
Medication Instructions:  Your physician recommends that you continue on your current medications as directed. Please refer to the Current Medication list given to you today.  *If you need a refill on your cardiac medications before your next appointment, please call your pharmacy*  Lab Work: None.  If you have labs (blood work) drawn today and your tests are completely normal, you will receive your results only by: . MyChart Message (if you have MyChart) OR . A paper copy in the mail If you have any lab test that is abnormal or we need to change your treatment, we will call you to review the results.  Testing/Procedures: Your physician has requested that you have an echocardiogram. Echocardiography is a painless test that uses sound waves to create images of your heart. It provides your doctor with information about the size and shape of your heart and how well your heart's chambers and valves are working. This procedure takes approximately one hour. There are no restrictions for this procedure.    Follow-Up: At [facility], you and your health needs are our priority.  As part of our continuing mission to provide you with exceptional heart care, we have created designated Provider Care Teams.  These Care Teams include your primary Cardiologist (physician) and Advanced Practice Providers (APPs -  Physician Assistants and Nurse Practitioners) who all work together to provide you with the care you need, when you need it.  Your next appointment:   6 months  The format for your next appointment:   In Person  Provider:   Robert Krasowski, MD  Other Instructions   Echocardiogram An echocardiogram is a procedure that uses painless sound waves (ultrasound) to produce an image of the heart. Images from an echocardiogram can provide important information about:  Signs of coronary artery disease (CAD).  Aneurysm detection. An aneurysm is a weak or damaged part of an artery wall that  bulges out from the normal force of blood pumping through the body.  Heart size and shape. Changes in the size or shape of the heart can be associated with certain conditions, including heart failure, aneurysm, and CAD.  Heart muscle function.  Heart valve function.  Signs of a past heart attack.  Fluid buildup around the heart.  Thickening of the heart muscle.  A tumor or infectious growth around the heart valves. Tell a health care provider about:  Any allergies you have.  All medicines you are taking, including vitamins, herbs, eye drops, creams, and over-the-counter medicines.  Any blood disorders you have.  Any surgeries you have had.  Any medical conditions you have.  Whether you are pregnant or may be pregnant. What are the risks? Generally, this is a safe procedure. However, problems may occur, including:  Allergic reaction to dye (contrast) that may be used during the procedure. What happens before the procedure? No specific preparation is needed. You may eat and drink normally. What happens during the procedure?   An IV tube may be inserted into one of your veins.  You may receive contrast through this tube. A contrast is an injection that improves the quality of the pictures from your heart.  A gel will be applied to your chest.  A wand-like tool (transducer) will be moved over your chest. The gel will help to transmit the sound waves from the transducer.  The sound waves will harmlessly bounce off of your heart to allow the heart images to be captured in real-time motion. The images will be recorded   on a computer. The procedure may vary among health care providers and hospitals. What happens after the procedure?  You may return to your normal, everyday life, including diet, activities, and medicines, unless your health care provider tells you not to do that. Summary  An echocardiogram is a procedure that uses painless sound waves (ultrasound) to produce  an image of the heart.  Images from an echocardiogram can provide important information about the size and shape of your heart, heart muscle function, heart valve function, and fluid buildup around your heart.  You do not need to do anything to prepare before this procedure. You may eat and drink normally.  After the echocardiogram is completed, you may return to your normal, everyday life, unless your health care provider tells you not to do that. This information is not intended to replace advice given to you by your health care provider. Make sure you discuss any questions you have with your health care provider. Document Released: 11/14/2000 Document Revised: 03/10/2019 Document Reviewed: 12/20/2016 Elsevier Patient Education  2020 Elsevier Inc.   

## 2019-09-20 NOTE — Progress Notes (Signed)
Cardiology Office Note:    Date:  09/20/2019   ID:  TAMMY ERICSSON, DOB [birth year], MRN [id number]  PCP:  Helen Hashimoto., MD  Cardiologist:  Jenne Campus, MD    Referring MD: Helen Hashimoto., MD   Chief Complaint  Patient presents with  . Follow-up  Doing better  History of Present Illness:    Brad [name] is a 85 y.o. male with past medical history significant for coronary artery disease, chronic atrial fibrillation, advanced COPD, also back problem as well as bladder cancer.  He comes to the 2 months of follow-up and have not made he is looking better than last time last few times have seen him he was either in the wheelchair or with a walker today he came with a cane he said he can pedal around to do much more than he did before denies have any cardiac complaints chief complaint is weakness fatigue and tiredness.  No chest pain, no tightness, no pressure no burning no squeezing the chest  Past Medical History:  Diagnosis Date  . Allergic rhinitis   . Chronic airflow obstruction (HCC)   . Coronary atherosclerosis   . DJD (degenerative joint disease)   . DM (diabetes mellitus) type II controlled peripheral vascular disorder   . GERD (gastroesophageal reflux disease)   . Hyperlipidemia   . Hypertension   . Osteoarthritis   . Renal insufficiency     No past surgical history on file.  Current Medications: Current Meds  Medication Sig  . dorzolamide-timolol (COSOPT) 22.3-6.8 MG/ML ophthalmic solution Place 1 drop into both eyes daily.  Marland Kitchen ELIQUIS 2.5 MG TABS tablet T1T2D  . famotidine (PEPCID) 20 MG tablet Take 1 tablet by mouth 2 (two) times daily.  . Fluticasone-Salmeterol (ADVAIR) 250-50 MCG/DOSE AEPB Inhale 1 puff into the lungs every 12 (twelve) hours.  . furosemide (LASIX) 20 MG tablet TAKE 2 TABLETS BY MOUTH IN THE MORNING AND 1 TABLET IN THE EVERNING.  Marland Kitchen Ipratropium-Albuterol (COMBIVENT RESPIMAT) 20-100 MCG/ACT AERS respimat Inhale 1 puff into the  lungs 4 (four) times daily as needed.  Marland Kitchen JANUVIA 50 MG tablet Take 50 mg by mouth daily.  Marland Kitchen latanoprost (XALATAN) 0.005 % ophthalmic solution Place 1 drop into both eyes daily.  Marland Kitchen levothyroxine (SYNTHROID, LEVOTHROID) 25 MCG tablet Take 1 tablet by mouth daily.  . metoprolol tartrate (LOPRESSOR) 25 MG tablet Take 1.5 tablets (37.5 mg total) by mouth 2 (two) times daily.  . Omega-3 Fatty Acids (FISH OIL) 1200 MG CAPS Take 1 capsule by mouth daily.  Marland Kitchen oxybutynin (DITROPAN) 5 MG tablet Take 1 tablet by mouth 2 (two) times daily.  . ramipril (ALTACE) 10 MG capsule Take 10 mg by mouth daily.  . ranolazine (RANEXA) 500 MG 12 hr tablet Take 1 tablet (500 mg total) by mouth 2 (two) times daily.  . SYMBICORT 160-4.5 MCG/ACT inhaler Inhale 2 puffs into the lungs daily.  . tamsulosin (FLOMAX) 0.4 MG CAPS capsule Take 1 capsule by mouth 2 (two) times daily.  . traMADol (ULTRAM) 50 MG tablet Take 50 mg by mouth as needed.     Allergies:   Fentanyl, Morphine, Atorvastatin, Penicillins, and Prednisone   Social History   Socioeconomic History  . Marital status: Married    Spouse name: Not on file  . Number of children: Not on file  . Years of education: Not on file  . Highest education level: Not on file  Occupational History  . Occupation: retired  Scientific laboratory technician  .  Financial resource strain: Not on file  . Food insecurity    Worry: Not on file    Inability: Not on file  . Transportation needs    Medical: Not on file    Non-medical: Not on file  Tobacco Use  . Smoking status: Former Smoker    Types: Cigarettes  . Smokeless tobacco: Never Used  Substance and Sexual Activity  . Alcohol use: Yes    Comment: occasionally  . Drug use: No  . Sexual activity: Not on file  Lifestyle  . Physical activity    Days per week: Not on file    Minutes per session: Not on file  . Stress: Not on file  Relationships  . Social Herbalist on phone: Not on file    Gets together: Not on file     Attends religious service: Not on file    Active member of club or organization: Not on file    Attends meetings of clubs or organizations: Not on file    Relationship status: Not on file  Other Topics Concern  . Not on file  Social History Narrative  . Not on file     Family History: The patient's family history is not on file. ROS:   Please see the history of present illness.    All 14 point review of systems negative except as described per history of present illness  EKGs/Labs/Other Studies Reviewed:    EKG today showed atrial fibrillation, controlled ventricular rate, right bundle branch block with left axis deviation  Recent Labs: No results found for requested labs within last 8760 hours.  Recent Lipid Panel No results found for: CHOL, TRIG, HDL, CHOLHDL, VLDL, LDLCALC, LDLDIRECT  Physical Exam:    VS:  BP 120/60   Pulse 89   Wt 166 lb (75.3 kg)   SpO2 98%   BMI 26.79 kg/m     Wt Readings from Last 3 Encounters:  09/20/19 166 lb (75.3 kg)  05/16/19 166 lb (75.3 kg)  11/11/18 171 lb (77.6 kg)     GEN:  Well nourished, well developed in no acute distress HEENT: Normal NECK: No JVD; No carotid bruits LYMPHATICS: No lymphadenopathy CARDIAC: RRR, no murmurs, no rubs, no gallops RESPIRATORY:  Clear to auscultation without rales, wheezing or rhonchi  ABDOMEN: Soft, non-tender, non-distended MUSCULOSKELETAL:  No edema; No deformity  SKIN: Warm and dry LOWER EXTREMITIES: no swelling NEUROLOGIC:  Alert and oriented x 3 PSYCHIATRIC:  Normal affect   ASSESSMENT:    1. Permanent atrial fibrillation ([place])   2. Peripheral vascular disease ([place])   3. Coronary artery disease involving native coronary artery of native heart without angina pectoris   4. History of coronary artery bypass graft    PLAN:    In order of problems listed above:  1. Permanent atrial fibrillation rate control anticoagulated which I will continue. 2. Peripheral vascular disease stable  continue present management. 3. Coronary artery disease doing well asymptomatic. 4. History of coronary artery disease noted status post coronary artery bypass graft.  I will schedule him to have echocardiogram to assess left ventricular ejection fraction.  I see him back in 6 months   Medication Adjustments/Labs and Tests Ordered: Current medicines are reviewed at length with the patient today.  Concerns regarding medicines are outlined above.  No orders of the defined types were placed in this encounter.  Medication changes: No orders of the defined types were placed in this encounter.   Signed, Herbie Baltimore  Synetta Fail, MD, [facility] 09/20/2019 11:09 AM    [place]

## 2019-09-29 ENCOUNTER — Other Ambulatory Visit: Payer: Self-pay

## 2019-09-29 ENCOUNTER — Ambulatory Visit (INDEPENDENT_AMBULATORY_CARE_PROVIDER_SITE_OTHER): Payer: Medicare Other

## 2019-09-29 DIAGNOSIS — I4821 Permanent atrial fibrillation: Secondary | ICD-10-CM | POA: Diagnosis not present

## 2019-09-29 DIAGNOSIS — I2510 Atherosclerotic heart disease of native coronary artery without angina pectoris: Secondary | ICD-10-CM

## 2019-09-29 NOTE — Progress Notes (Signed)
Complete echocardiogram has been performed.  Jimmy Dally Oshel, RDCS, RVT 

## 2019-11-10 ENCOUNTER — Other Ambulatory Visit: Payer: Self-pay | Admitting: Cardiology

## 2019-12-08 ENCOUNTER — Other Ambulatory Visit: Payer: Self-pay | Admitting: Cardiology

## 2019-12-12 DIAGNOSIS — C672 Malignant neoplasm of lateral wall of bladder: Secondary | ICD-10-CM | POA: Diagnosis not present

## 2019-12-13 DIAGNOSIS — R82998 Other abnormal findings in urine: Secondary | ICD-10-CM | POA: Diagnosis not present

## 2020-01-03 ENCOUNTER — Other Ambulatory Visit: Payer: Self-pay | Admitting: Cardiology

## 2020-01-25 DIAGNOSIS — H04123 Dry eye syndrome of bilateral lacrimal glands: Secondary | ICD-10-CM | POA: Diagnosis not present

## 2020-01-25 DIAGNOSIS — H2512 Age-related nuclear cataract, left eye: Secondary | ICD-10-CM | POA: Diagnosis not present

## 2020-01-25 DIAGNOSIS — H02102 Unspecified ectropion of right lower eyelid: Secondary | ICD-10-CM | POA: Diagnosis not present

## 2020-01-25 DIAGNOSIS — H401132 Primary open-angle glaucoma, bilateral, moderate stage: Secondary | ICD-10-CM | POA: Diagnosis not present

## 2020-01-25 DIAGNOSIS — H25042 Posterior subcapsular polar age-related cataract, left eye: Secondary | ICD-10-CM | POA: Diagnosis not present

## 2020-03-15 DIAGNOSIS — L578 Other skin changes due to chronic exposure to nonionizing radiation: Secondary | ICD-10-CM | POA: Diagnosis not present

## 2020-03-15 DIAGNOSIS — L821 Other seborrheic keratosis: Secondary | ICD-10-CM | POA: Diagnosis not present

## 2020-03-15 DIAGNOSIS — D045 Carcinoma in situ of skin of trunk: Secondary | ICD-10-CM | POA: Diagnosis not present

## 2020-03-15 DIAGNOSIS — L570 Actinic keratosis: Secondary | ICD-10-CM | POA: Diagnosis not present

## 2020-04-03 ENCOUNTER — Other Ambulatory Visit: Payer: Self-pay | Admitting: Cardiology

## 2020-04-04 NOTE — Telephone Encounter (Signed)
LOV with Dr. Bettina Gavia was 09/29/2019

## 2020-04-04 NOTE — Telephone Encounter (Signed)
41m 75.3kg Scr 1.4 Lovw/munley 09/29/19 Pt requesting 2.5 eliquis but qualifies for 5mg  will route to pharmd for further instruction

## 2020-04-05 ENCOUNTER — Encounter: Payer: Self-pay | Admitting: Cardiology

## 2020-04-05 ENCOUNTER — Other Ambulatory Visit: Payer: Self-pay

## 2020-04-05 ENCOUNTER — Ambulatory Visit (INDEPENDENT_AMBULATORY_CARE_PROVIDER_SITE_OTHER): Payer: Medicare Other | Admitting: Cardiology

## 2020-04-05 VITALS — BP 130/76 | HR 49 | Ht 66.5 in | Wt 167.0 lb

## 2020-04-05 DIAGNOSIS — I255 Ischemic cardiomyopathy: Secondary | ICD-10-CM

## 2020-04-05 DIAGNOSIS — I739 Peripheral vascular disease, unspecified: Secondary | ICD-10-CM

## 2020-04-05 DIAGNOSIS — Z951 Presence of aortocoronary bypass graft: Secondary | ICD-10-CM

## 2020-04-05 DIAGNOSIS — I4821 Permanent atrial fibrillation: Secondary | ICD-10-CM

## 2020-04-05 DIAGNOSIS — C679 Malignant neoplasm of bladder, unspecified: Secondary | ICD-10-CM

## 2020-04-05 DIAGNOSIS — I429 Cardiomyopathy, unspecified: Secondary | ICD-10-CM | POA: Insufficient documentation

## 2020-04-05 DIAGNOSIS — I2510 Atherosclerotic heart disease of native coronary artery without angina pectoris: Secondary | ICD-10-CM | POA: Diagnosis not present

## 2020-04-05 NOTE — Progress Notes (Signed)
Cardiology Office Note:    Date:  04/05/2020   ID:  Brad [name], DOB [birth year], MRN [id number]  PCP:  Helen Hashimoto., MD  Cardiologist:  Jenne Campus, MD    Referring MD: Helen Hashimoto., MD   No chief complaint on file. M doing better to last time  History of Present Illness:    Brad [name] is a 86 y.o. male complex past medical history significant for coronary artery disease, status post coronary bypass graft, diabetes, dyslipidemia, essential hypertension, bladder cancer.  He comes to me for follow-up.  He said that he is feeling better.  He got more energy.  Done through his the biggest problem that he is he is back issues that prevent him from doing more activities.  There was a time that he was in the wheelchair Brad [name] he was stable to come with a walker and now he comes only with a cane.  Described to have fatigue tiredness no chest pain tightness squeezing pressure burning chest.  What concerns me is to find her last echocardiogram done in the fall time showed some deterioration of his left ventricular ejection fraction.  He is already on small dose of beta-blocker, he is also on ACE inhibitor which I will continue.  Past Medical History:  Diagnosis Date  . Allergic rhinitis   . Chronic airflow obstruction (HCC)   . Coronary atherosclerosis   . DJD (degenerative joint disease)   . DM (diabetes mellitus) type II controlled peripheral vascular disorder   . GERD (gastroesophageal reflux disease)   . Hyperlipidemia   . Hypertension   . Osteoarthritis   . Renal insufficiency     History reviewed. No pertinent surgical history.  Current Medications: Current Meds  Medication Sig  . dorzolamide-timolol (COSOPT) 22.3-6.8 MG/ML ophthalmic solution Place 1 drop into both eyes daily.  Marland Kitchen ELIQUIS 2.5 MG TABS tablet TAKE 1 TABLET BY MOUTH TWICE A DAY.  . famotidine (PEPCID) 20 MG tablet Take 1 tablet by mouth 2 (two) times daily.  . Fluticasone-Salmeterol (ADVAIR)  250-50 MCG/DOSE AEPB Inhale 1 puff into the lungs every 12 (twelve) hours.  . furosemide (LASIX) 20 MG tablet TAKE 2 TABLETS BY MOUTH IN THE MORNING AND 1 TABLET IN THE EVERNING.  Marland Kitchen Ipratropium-Albuterol (COMBIVENT RESPIMAT) 20-100 MCG/ACT AERS respimat Inhale 1 puff into the lungs 4 (four) times daily as needed.  Marland Kitchen JANUVIA 50 MG tablet Take 50 mg by mouth daily.  Marland Kitchen latanoprost (XALATAN) 0.005 % ophthalmic solution Place 1 drop into both eyes daily.  Marland Kitchen levothyroxine (SYNTHROID, LEVOTHROID) 25 MCG tablet Take 1 tablet by mouth daily.  . metoprolol tartrate (LOPRESSOR) 25 MG tablet TAKE 1 & 1/2 TABLETS BY MOUTH 2 TIMES DAILY.  Marland Kitchen Omega-3 Fatty Acids (FISH OIL) 1200 MG CAPS Take 1 capsule by mouth daily.  Marland Kitchen oxybutynin (DITROPAN) 5 MG tablet Take 1 tablet by mouth 2 (two) times daily.  . ramipril (ALTACE) 10 MG capsule Take 10 mg by mouth daily.  . ranolazine (RANEXA) 500 MG 12 hr tablet TAKE 1 TABLET BY MOUTH 2 TIMES DAILY.  . SYMBICORT 160-4.5 MCG/ACT inhaler Inhale 2 puffs into the lungs daily.  . tamsulosin (FLOMAX) 0.4 MG CAPS capsule Take 1 capsule by mouth 2 (two) times daily.  . traMADol (ULTRAM) 50 MG tablet Take 50 mg by mouth as needed.     Allergies:   Fentanyl, Morphine, Atorvastatin, Penicillins, and Prednisone   Social History   Socioeconomic History  . Marital status: Married  Spouse name: Not on file  . Number of children: Not on file  . Years of education: Not on file  . Highest education level: Not on file  Occupational History  . Occupation: retired  Tobacco Use  . Smoking status: Former Smoker    Types: Cigarettes  . Smokeless tobacco: Never Used  Substance and Sexual Activity  . Alcohol use: Yes    Comment: occasionally  . Drug use: No  . Sexual activity: Not on file  Other Topics Concern  . Not on file  Social History Narrative  . Not on file   Social Determinants of Health   Financial Resource Strain:   . Difficulty of Paying Living Expenses:   Food  Insecurity:   . Worried About Charity fundraiser in the Last Year:   . Arboriculturist in the Last Year:   Transportation Needs:   . Film/video editor (Medical):   Marland Kitchen Lack of Transportation (Non-Medical):   Physical Activity:   . Days of Exercise per Week:   . Minutes of Exercise per Session:   Stress:   . Feeling of Stress :   Social Connections:   . Frequency of Communication with Friends and Family:   . Frequency of Social Gatherings with Friends and Family:   . Attends Religious Services:   . Active Member of Clubs or Organizations:   . Attends Archivist Meetings:   Marland Kitchen Marital Status:      Family History: The patient's family history is not on file. ROS:   Please see the history of present illness.    All 14 point review of systems negative except as described per history of present illness  EKGs/Labs/Other Studies Reviewed:      Recent Labs: No results found for requested labs within last 8760 hours.  Recent Lipid Panel No results found for: CHOL, TRIG, HDL, CHOLHDL, VLDL, LDLCALC, LDLDIRECT  Physical Exam:    VS:  BP 130/76   Pulse (!) 49   Ht 5' 6.5" (1.689 m)   Wt 167 lb (75.8 kg)   SpO2 90%   BMI 26.55 kg/m     Wt Readings from Last 3 Encounters:  04/05/20 167 lb (75.8 kg)  09/20/19 166 lb (75.3 kg)  05/16/19 166 lb (75.3 kg)     GEN:  Well nourished, well developed in no acute distress HEENT: Normal NECK: No JVD; No carotid bruits LYMPHATICS: No lymphadenopathy CARDIAC: RRR, no murmurs, no rubs, no gallops RESPIRATORY:  Clear to auscultation without rales, wheezing or rhonchi  ABDOMEN: Soft, non-tender, non-distended MUSCULOSKELETAL:  No edema; No deformity  SKIN: Warm and dry LOWER EXTREMITIES: no swelling NEUROLOGIC:  Alert and oriented x 3 PSYCHIATRIC:  Normal affect   ASSESSMENT:    1. Coronary artery disease involving native coronary artery of native heart without angina pectoris   2. Ischemic cardiomyopathy   3.  Peripheral vascular disease ([place])   4. Permanent atrial fibrillation ([place])   5. History of coronary artery bypass graft   6. Malignant neoplasm of urinary bladder, unspecified site [facility])    PLAN:    In order of problems listed above:  1. Coronary disease stable on appropriate medications.  He is on Eliquis not on aspirin.  There was no recent coronary event within the last year therefore I will can skip aspirin.  He is asymptomatic. 2. Ischemic cardiomyopathy his ejection fraction is now 4045%.  He is on ACE inhibitor as well as beta-blocker which I  will continue but I will ask him to have repeated echocardiogram if echocardiogram which showed deterioration of left ventricle ejection fraction, please note improvement but we probably switch him from oldest to [place].  We will also switch him to long-acting form of beta-blocker. Permanent atrial fibrillation rate controlled continue present management continue anticoagulation with Eliquis. 4.  Urinary bladder cancer.  Been being followed by oncology team.   Medication Adjustments/Labs and Tests Ordered: Current medicines are reviewed at length with the patient today.  Concerns regarding medicines are outlined above.  No orders of the defined types were placed in this encounter.  Medication changes: No orders of the defined types were placed in this encounter.   Signed, Park Liter, MD, [facility] 04/05/2020 9:55 AM    [place]

## 2020-04-05 NOTE — Patient Instructions (Signed)
Medication Instructions:  °Your physician recommends that you continue on your current medications as directed. Please refer to the Current Medication list given to you today. ° °*If you need a refill on your cardiac medications before your next appointment, please call your pharmacy* ° ° °Lab Work: °None. ° °If you have labs (blood work) drawn today and your tests are completely normal, you will receive your results only by: °• MyChart Message (if you have MyChart) OR °• A paper copy in the mail °If you have any lab test that is abnormal or we need to change your treatment, we will call you to review the results. ° ° °Testing/Procedures: °Your physician has requested that you have an echocardiogram. Echocardiography is a painless test that uses sound waves to create images of your heart. It provides your doctor with information about the size and shape of your heart and how well your heart’s chambers and valves are working. This procedure takes approximately one hour. There are no restrictions for this procedure. ° ° ° ° °Follow-Up: °At [facility], you and your health needs are our priority.  As part of our continuing mission to provide you with exceptional heart care, we have created designated Provider Care Teams.  These Care Teams include your primary Cardiologist (physician) and Advanced Practice Providers (APPs -  Physician Assistants and Nurse Practitioners) who all work together to provide you with the care you need, when you need it. ° °We recommend signing up for the patient portal called "MyChart".  Sign up information is provided on this After Visit Summary.  MyChart is used to connect with patients for Virtual Visits (Telemedicine).  Patients are able to view lab/test results, encounter notes, upcoming appointments, etc.  Non-urgent messages can be sent to your provider as well.   °To learn more about what you can do with MyChart, go to [email or web address].   ° °Your next appointment:   °6  month(s) ° °The format for your next appointment:   °In Person ° °Provider:   °Robert Krasowski, MD ° ° °Other Instructions ° ° °Echocardiogram °An echocardiogram is a procedure that uses painless sound waves (ultrasound) to produce an image of the heart. Images from an echocardiogram can provide important information about: °· Signs of coronary artery disease (CAD). °· Aneurysm detection. An aneurysm is a weak or damaged part of an artery wall that bulges out from the normal force of blood pumping through the body. °· Heart size and shape. Changes in the size or shape of the heart can be associated with certain conditions, including heart failure, aneurysm, and CAD. °· Heart muscle function. °· Heart valve function. °· Signs of a past heart attack. °· Fluid buildup around the heart. °· Thickening of the heart muscle. °· A tumor or infectious growth around the heart valves. °Tell a health care provider about: °· Any allergies you have. °· All medicines you are taking, including vitamins, herbs, eye drops, creams, and over-the-counter medicines. °· Any blood disorders you have. °· Any surgeries you have had. °· Any medical conditions you have. °· Whether you are pregnant or may be pregnant. °What are the risks? °Generally, this is a safe procedure. However, problems may occur, including: °· Allergic reaction to dye (contrast) that may be used during the procedure. °What happens before the procedure? °No specific preparation is needed. You may eat and drink normally. °What happens during the procedure? ° °· An IV tube may be inserted into one of your veins. °· You may   receive contrast through this tube. A contrast is an injection that improves the quality of the pictures from your heart. °· A gel will be applied to your chest. °· A wand-like tool (transducer) will be moved over your chest. The gel will help to transmit the sound waves from the transducer. °· The sound waves will harmlessly bounce off of your heart to  allow the heart images to be captured in real-time motion. The images will be recorded on a computer. °The procedure may vary among health care providers and hospitals. °What happens after the procedure? °· You may return to your normal, everyday life, including diet, activities, and medicines, unless your health care provider tells you not to do that. °Summary °· An echocardiogram is a procedure that uses painless sound waves (ultrasound) to produce an image of the heart. °· Images from an echocardiogram can provide important information about the size and shape of your heart, heart muscle function, heart valve function, and fluid buildup around your heart. °· You do not need to do anything to prepare before this procedure. You may eat and drink normally. °· After the echocardiogram is completed, you may return to your normal, everyday life, unless your health care provider tells you not to do that. °This information is not intended to replace advice given to you by your health care provider. Make sure you discuss any questions you have with your health care provider. °Document Revised: 03/10/2019 Document Reviewed: 12/20/2016 °Elsevier Patient Education © 2020 [employer] Inc. ° ° °

## 2020-04-05 NOTE — Telephone Encounter (Signed)
Scr 1.48 ; will continue Eliquis 2.5mg  for now and repeat BMET prior to next refill

## 2020-04-24 ENCOUNTER — Ambulatory Visit (INDEPENDENT_AMBULATORY_CARE_PROVIDER_SITE_OTHER): Payer: Medicare Other

## 2020-04-24 ENCOUNTER — Other Ambulatory Visit: Payer: Self-pay

## 2020-04-24 DIAGNOSIS — I4821 Permanent atrial fibrillation: Secondary | ICD-10-CM | POA: Diagnosis not present

## 2020-04-24 DIAGNOSIS — I255 Ischemic cardiomyopathy: Secondary | ICD-10-CM | POA: Diagnosis not present

## 2020-04-24 NOTE — Progress Notes (Signed)
2D echocardiogram has been completed.

## 2020-04-26 DIAGNOSIS — H2512 Age-related nuclear cataract, left eye: Secondary | ICD-10-CM | POA: Diagnosis not present

## 2020-04-26 DIAGNOSIS — Z7984 Long term (current) use of oral hypoglycemic drugs: Secondary | ICD-10-CM | POA: Diagnosis not present

## 2020-04-26 DIAGNOSIS — H04123 Dry eye syndrome of bilateral lacrimal glands: Secondary | ICD-10-CM | POA: Diagnosis not present

## 2020-04-26 DIAGNOSIS — Z961 Presence of intraocular lens: Secondary | ICD-10-CM | POA: Diagnosis not present

## 2020-04-26 DIAGNOSIS — H25012 Cortical age-related cataract, left eye: Secondary | ICD-10-CM | POA: Diagnosis not present

## 2020-04-26 DIAGNOSIS — E119 Type 2 diabetes mellitus without complications: Secondary | ICD-10-CM | POA: Diagnosis not present

## 2020-04-26 DIAGNOSIS — H02102 Unspecified ectropion of right lower eyelid: Secondary | ICD-10-CM | POA: Diagnosis not present

## 2020-04-26 DIAGNOSIS — H401132 Primary open-angle glaucoma, bilateral, moderate stage: Secondary | ICD-10-CM | POA: Diagnosis not present

## 2020-04-26 DIAGNOSIS — H25042 Posterior subcapsular polar age-related cataract, left eye: Secondary | ICD-10-CM | POA: Diagnosis not present

## 2020-06-11 DIAGNOSIS — C672 Malignant neoplasm of lateral wall of bladder: Secondary | ICD-10-CM | POA: Diagnosis not present

## 2020-06-12 DIAGNOSIS — R82998 Other abnormal findings in urine: Secondary | ICD-10-CM | POA: Diagnosis not present

## 2020-06-15 ENCOUNTER — Other Ambulatory Visit: Payer: Self-pay | Admitting: Cardiology

## 2020-06-26 ENCOUNTER — Other Ambulatory Visit: Payer: Self-pay | Admitting: Cardiology

## 2020-07-16 DIAGNOSIS — H2512 Age-related nuclear cataract, left eye: Secondary | ICD-10-CM | POA: Diagnosis not present

## 2020-08-01 HISTORY — PX: OTHER SURGICAL HISTORY: SHX169

## 2020-08-09 DIAGNOSIS — L570 Actinic keratosis: Secondary | ICD-10-CM | POA: Diagnosis not present

## 2020-08-09 DIAGNOSIS — L821 Other seborrheic keratosis: Secondary | ICD-10-CM | POA: Diagnosis not present

## 2020-08-09 DIAGNOSIS — L820 Inflamed seborrheic keratosis: Secondary | ICD-10-CM | POA: Diagnosis not present

## 2020-08-09 DIAGNOSIS — L578 Other skin changes due to chronic exposure to nonionizing radiation: Secondary | ICD-10-CM | POA: Diagnosis not present

## 2020-08-09 DIAGNOSIS — D045 Carcinoma in situ of skin of trunk: Secondary | ICD-10-CM | POA: Diagnosis not present

## 2020-08-21 DIAGNOSIS — H02112 Cicatricial ectropion of right lower eyelid: Secondary | ICD-10-CM | POA: Diagnosis not present

## 2020-08-21 DIAGNOSIS — H02132 Senile ectropion of right lower eyelid: Secondary | ICD-10-CM | POA: Diagnosis not present

## 2020-08-21 DIAGNOSIS — H16213 Exposure keratoconjunctivitis, bilateral: Secondary | ICD-10-CM | POA: Diagnosis not present

## 2020-08-21 DIAGNOSIS — H02135 Senile ectropion of left lower eyelid: Secondary | ICD-10-CM | POA: Diagnosis not present

## 2020-08-21 DIAGNOSIS — H02532 Eyelid retraction right lower eyelid: Secondary | ICD-10-CM | POA: Diagnosis not present

## 2020-08-21 DIAGNOSIS — H02535 Eyelid retraction left lower eyelid: Secondary | ICD-10-CM | POA: Diagnosis not present

## 2020-08-21 DIAGNOSIS — H02115 Cicatricial ectropion of left lower eyelid: Secondary | ICD-10-CM | POA: Diagnosis not present

## 2020-08-21 DIAGNOSIS — H04123 Dry eye syndrome of bilateral lacrimal glands: Secondary | ICD-10-CM | POA: Diagnosis not present

## 2020-08-21 DIAGNOSIS — H02145 Spastic ectropion of left lower eyelid: Secondary | ICD-10-CM | POA: Diagnosis not present

## 2020-08-21 DIAGNOSIS — H02142 Spastic ectropion of right lower eyelid: Secondary | ICD-10-CM | POA: Diagnosis not present

## 2020-08-21 DIAGNOSIS — H04523 Eversion of bilateral lacrimal punctum: Secondary | ICD-10-CM | POA: Diagnosis not present

## 2020-08-23 DIAGNOSIS — R05 Cough: Secondary | ICD-10-CM | POA: Diagnosis not present

## 2020-08-23 DIAGNOSIS — R519 Headache, unspecified: Secondary | ICD-10-CM | POA: Diagnosis not present

## 2020-08-23 DIAGNOSIS — Z20828 Contact with and (suspected) exposure to other viral communicable diseases: Secondary | ICD-10-CM | POA: Diagnosis not present

## 2020-08-23 DIAGNOSIS — R0602 Shortness of breath: Secondary | ICD-10-CM | POA: Diagnosis not present

## 2020-08-23 DIAGNOSIS — R091 Pleurisy: Secondary | ICD-10-CM | POA: Diagnosis not present

## 2020-08-23 DIAGNOSIS — R0981 Nasal congestion: Secondary | ICD-10-CM | POA: Diagnosis not present

## 2020-08-27 ENCOUNTER — Other Ambulatory Visit: Payer: Self-pay | Admitting: Cardiology

## 2020-08-27 DIAGNOSIS — I4821 Permanent atrial fibrillation: Secondary | ICD-10-CM

## 2020-08-28 NOTE — Telephone Encounter (Signed)
78m 75.8kg Scr 1.480 mg/ 04/06/19 outdated labs needed for further refills Lovw/Dr.K 04/05/20

## 2020-08-30 DIAGNOSIS — Z79899 Other long term (current) drug therapy: Secondary | ICD-10-CM | POA: Diagnosis not present

## 2020-08-30 DIAGNOSIS — J984 Other disorders of lung: Secondary | ICD-10-CM | POA: Diagnosis not present

## 2020-08-30 DIAGNOSIS — M159 Polyosteoarthritis, unspecified: Secondary | ICD-10-CM | POA: Diagnosis not present

## 2020-08-30 DIAGNOSIS — E1151 Type 2 diabetes mellitus with diabetic peripheral angiopathy without gangrene: Secondary | ICD-10-CM | POA: Diagnosis not present

## 2020-08-30 DIAGNOSIS — D692 Other nonthrombocytopenic purpura: Secondary | ICD-10-CM | POA: Diagnosis not present

## 2020-08-30 DIAGNOSIS — N184 Chronic kidney disease, stage 4 (severe): Secondary | ICD-10-CM | POA: Diagnosis not present

## 2020-08-30 DIAGNOSIS — I2510 Atherosclerotic heart disease of native coronary artery without angina pectoris: Secondary | ICD-10-CM | POA: Diagnosis not present

## 2020-08-30 DIAGNOSIS — J449 Chronic obstructive pulmonary disease, unspecified: Secondary | ICD-10-CM | POA: Diagnosis not present

## 2020-08-30 DIAGNOSIS — E782 Mixed hyperlipidemia: Secondary | ICD-10-CM | POA: Diagnosis not present

## 2020-08-30 DIAGNOSIS — J439 Emphysema, unspecified: Secondary | ICD-10-CM | POA: Diagnosis not present

## 2020-08-30 DIAGNOSIS — I10 Essential (primary) hypertension: Secondary | ICD-10-CM | POA: Diagnosis not present

## 2020-08-30 DIAGNOSIS — I739 Peripheral vascular disease, unspecified: Secondary | ICD-10-CM | POA: Diagnosis not present

## 2020-09-03 DIAGNOSIS — R262 Difficulty in walking, not elsewhere classified: Secondary | ICD-10-CM | POA: Diagnosis not present

## 2020-09-11 DIAGNOSIS — M6281 Muscle weakness (generalized): Secondary | ICD-10-CM | POA: Diagnosis not present

## 2020-09-11 DIAGNOSIS — H02135 Senile ectropion of left lower eyelid: Secondary | ICD-10-CM | POA: Diagnosis not present

## 2020-09-11 DIAGNOSIS — H02132 Senile ectropion of right lower eyelid: Secondary | ICD-10-CM | POA: Diagnosis not present

## 2020-09-11 DIAGNOSIS — M5459 Other low back pain: Secondary | ICD-10-CM | POA: Diagnosis not present

## 2020-09-11 DIAGNOSIS — R262 Difficulty in walking, not elsewhere classified: Secondary | ICD-10-CM | POA: Diagnosis not present

## 2020-09-13 DIAGNOSIS — M5459 Other low back pain: Secondary | ICD-10-CM | POA: Diagnosis not present

## 2020-09-13 DIAGNOSIS — R262 Difficulty in walking, not elsewhere classified: Secondary | ICD-10-CM | POA: Diagnosis not present

## 2020-09-13 DIAGNOSIS — M6281 Muscle weakness (generalized): Secondary | ICD-10-CM | POA: Diagnosis not present

## 2020-09-17 ENCOUNTER — Telehealth: Payer: Self-pay | Admitting: *Deleted

## 2020-09-17 NOTE — Telephone Encounter (Signed)
Patient with diagnosis of atrial fibrillation on Eliquis for anticoagulation.    Procedure: right lower eyelid cicatrical ectropion repair Date of procedure: 10/01/20    CHA2DS2-VASc Score = 6  This indicates a 9.7% annual risk of stroke. The patient's score is based upon: CHF History: 1 HTN History: 1 Diabetes History: 1 Stroke History: 0 Vascular Disease History: 1 Age Score: 2 Gender Score: 0    CrCl 28.8 Platelet count 104  Per office protocol, patient can hold Eliquis for 2 days prior to procedure.   Patient will not need bridging with Lovenox (enoxaparin) around procedure.

## 2020-09-17 NOTE — Telephone Encounter (Signed)
° °  [facility] Pre-operative Risk Assessment    Request for surgical clearance:  1. What type of surgery is being performed? Right lower eyelid cicatricial ectropion repair    2. When is this surgery scheduled? 10/01/20    3. What type of clearance is required (medical clearance vs. Pharmacy clearance to hold med vs. Both)? Both  4. Are there any medications that need to be held prior to surgery and how long?Eliquis   5. Practice name and name of physician performing surgery? [facility], Dr. Isidoro Donning  6. What is your office phone number [phone]   7.   What is your office fax number [phone]  8.   Anesthesia type (None, local, MAC, general) ? MAC   Brad [name] 09/17/2020, 11:01 AM  _________________________________________________________________   (provider comments below)

## 2020-09-18 NOTE — Telephone Encounter (Signed)
   Primary Cardiologist: Jenne Campus, MD  Chart reviewed as part of pre-operative protocol coverage. Patient was contacted 09/18/2020 in reference to pre-operative risk assessment for pending surgery as outlined below.  Brad [name] was last seen on 04/03/20 by Dr. Agustin Cree.  Since that day, Brad [name] has done well.  He reports that he can complete 4.0 METS (can climb stairs, moderate house work) without angina. According to the RCRI, he has a 6-11% risk of major cardiac complication during the perioperative period (11% risk if sCr over 2). It is encouraging that the procedure will only require MAC and not general anesthesia. He is aware of his risk and wishes to proceed.   Per our clinical pharmacist: Patient with diagnosis of atrial fibrillation on Eliquis for anticoagulation.    Procedure: right lower eyelid cicatrical ectropion repair Date of procedure: 10/01/20    CHA2DS2-VASc Score = 6  This indicates a 9.7% annual risk of stroke. The patient's score is based upon: CHF History: 1 HTN History: 1 Diabetes History: 1 Stroke History: 0 Vascular Disease History: 1 Age Score: 2 Gender Score: 0    CrCl 28.8 Platelet count 104  Per office protocol, patient can hold Eliquis for 2 days prior to procedure.   Patient will not need bridging with Lovenox (enoxaparin) around procedure.   Therefore, based on ACC/AHA guidelines, the patient would be at acceptable risk for the planned procedure without further cardiovascular testing.   The patient was advised that if he develops new symptoms prior to surgery to contact our office to arrange for a follow-up visit, and he verbalized understanding.  I will route this recommendation to the requesting party via Epic fax function and remove from pre-op pool. Please call with questions.  [place], PA 09/18/2020, 10:02 AM

## 2020-09-20 DIAGNOSIS — M6281 Muscle weakness (generalized): Secondary | ICD-10-CM | POA: Diagnosis not present

## 2020-09-20 DIAGNOSIS — M5459 Other low back pain: Secondary | ICD-10-CM | POA: Diagnosis not present

## 2020-09-20 DIAGNOSIS — R262 Difficulty in walking, not elsewhere classified: Secondary | ICD-10-CM | POA: Diagnosis not present

## 2020-09-25 ENCOUNTER — Other Ambulatory Visit: Payer: Self-pay | Admitting: Cardiology

## 2020-09-25 DIAGNOSIS — M5459 Other low back pain: Secondary | ICD-10-CM | POA: Diagnosis not present

## 2020-09-25 DIAGNOSIS — M6281 Muscle weakness (generalized): Secondary | ICD-10-CM | POA: Diagnosis not present

## 2020-09-25 DIAGNOSIS — R262 Difficulty in walking, not elsewhere classified: Secondary | ICD-10-CM | POA: Diagnosis not present

## 2020-09-27 DIAGNOSIS — M6281 Muscle weakness (generalized): Secondary | ICD-10-CM | POA: Diagnosis not present

## 2020-09-27 DIAGNOSIS — M5459 Other low back pain: Secondary | ICD-10-CM | POA: Diagnosis not present

## 2020-09-27 DIAGNOSIS — R262 Difficulty in walking, not elsewhere classified: Secondary | ICD-10-CM | POA: Diagnosis not present

## 2020-10-01 DIAGNOSIS — H04523 Eversion of bilateral lacrimal punctum: Secondary | ICD-10-CM | POA: Diagnosis not present

## 2020-10-01 DIAGNOSIS — H02112 Cicatricial ectropion of right lower eyelid: Secondary | ICD-10-CM | POA: Diagnosis not present

## 2020-10-01 DIAGNOSIS — H02535 Eyelid retraction left lower eyelid: Secondary | ICD-10-CM | POA: Diagnosis not present

## 2020-10-01 DIAGNOSIS — H02135 Senile ectropion of left lower eyelid: Secondary | ICD-10-CM | POA: Diagnosis not present

## 2020-10-01 DIAGNOSIS — H04123 Dry eye syndrome of bilateral lacrimal glands: Secondary | ICD-10-CM | POA: Diagnosis not present

## 2020-10-01 DIAGNOSIS — H16213 Exposure keratoconjunctivitis, bilateral: Secondary | ICD-10-CM | POA: Diagnosis not present

## 2020-10-01 DIAGNOSIS — H02132 Senile ectropion of right lower eyelid: Secondary | ICD-10-CM | POA: Diagnosis not present

## 2020-10-01 DIAGNOSIS — H02115 Cicatricial ectropion of left lower eyelid: Secondary | ICD-10-CM | POA: Diagnosis not present

## 2020-10-01 DIAGNOSIS — H02532 Eyelid retraction right lower eyelid: Secondary | ICD-10-CM | POA: Diagnosis not present

## 2020-10-01 DIAGNOSIS — H04561 Stenosis of right lacrimal punctum: Secondary | ICD-10-CM | POA: Diagnosis not present

## 2020-10-01 DIAGNOSIS — H02145 Spastic ectropion of left lower eyelid: Secondary | ICD-10-CM | POA: Diagnosis not present

## 2020-10-01 DIAGNOSIS — H02142 Spastic ectropion of right lower eyelid: Secondary | ICD-10-CM | POA: Diagnosis not present

## 2020-10-01 DIAGNOSIS — H11821 Conjunctivochalasis, right eye: Secondary | ICD-10-CM | POA: Diagnosis not present

## 2020-10-23 ENCOUNTER — Other Ambulatory Visit: Payer: Self-pay | Admitting: Cardiology

## 2020-11-04 DIAGNOSIS — R52 Pain, unspecified: Secondary | ICD-10-CM | POA: Diagnosis not present

## 2020-11-04 DIAGNOSIS — I517 Cardiomegaly: Secondary | ICD-10-CM | POA: Diagnosis not present

## 2020-11-04 DIAGNOSIS — M79672 Pain in left foot: Secondary | ICD-10-CM | POA: Diagnosis not present

## 2020-11-04 DIAGNOSIS — J9811 Atelectasis: Secondary | ICD-10-CM | POA: Diagnosis not present

## 2020-11-04 DIAGNOSIS — R0902 Hypoxemia: Secondary | ICD-10-CM | POA: Diagnosis not present

## 2020-11-04 DIAGNOSIS — L03116 Cellulitis of left lower limb: Secondary | ICD-10-CM | POA: Diagnosis not present

## 2020-11-04 DIAGNOSIS — J439 Emphysema, unspecified: Secondary | ICD-10-CM | POA: Diagnosis not present

## 2020-11-04 DIAGNOSIS — M7989 Other specified soft tissue disorders: Secondary | ICD-10-CM | POA: Diagnosis not present

## 2020-11-04 DIAGNOSIS — R609 Edema, unspecified: Secondary | ICD-10-CM | POA: Diagnosis not present

## 2020-11-05 DIAGNOSIS — I2510 Atherosclerotic heart disease of native coronary artery without angina pectoris: Secondary | ICD-10-CM | POA: Diagnosis present

## 2020-11-05 DIAGNOSIS — B379 Candidiasis, unspecified: Secondary | ICD-10-CM | POA: Diagnosis not present

## 2020-11-05 DIAGNOSIS — Z87891 Personal history of nicotine dependence: Secondary | ICD-10-CM | POA: Diagnosis not present

## 2020-11-05 DIAGNOSIS — Z7902 Long term (current) use of antithrombotics/antiplatelets: Secondary | ICD-10-CM | POA: Diagnosis not present

## 2020-11-05 DIAGNOSIS — Z23 Encounter for immunization: Secondary | ICD-10-CM | POA: Diagnosis not present

## 2020-11-05 DIAGNOSIS — M25572 Pain in left ankle and joints of left foot: Secondary | ICD-10-CM | POA: Diagnosis not present

## 2020-11-05 DIAGNOSIS — J439 Emphysema, unspecified: Secondary | ICD-10-CM | POA: Diagnosis not present

## 2020-11-05 DIAGNOSIS — J9610 Chronic respiratory failure, unspecified whether with hypoxia or hypercapnia: Secondary | ICD-10-CM | POA: Diagnosis not present

## 2020-11-05 DIAGNOSIS — E119 Type 2 diabetes mellitus without complications: Secondary | ICD-10-CM | POA: Diagnosis not present

## 2020-11-05 DIAGNOSIS — I480 Paroxysmal atrial fibrillation: Secondary | ICD-10-CM | POA: Diagnosis not present

## 2020-11-05 DIAGNOSIS — R278 Other lack of coordination: Secondary | ICD-10-CM | POA: Diagnosis not present

## 2020-11-05 DIAGNOSIS — M6281 Muscle weakness (generalized): Secondary | ICD-10-CM | POA: Diagnosis not present

## 2020-11-05 DIAGNOSIS — M109 Gout, unspecified: Secondary | ICD-10-CM | POA: Diagnosis present

## 2020-11-05 DIAGNOSIS — M79672 Pain in left foot: Secondary | ICD-10-CM | POA: Diagnosis not present

## 2020-11-05 DIAGNOSIS — E039 Hypothyroidism, unspecified: Secondary | ICD-10-CM | POA: Diagnosis present

## 2020-11-05 DIAGNOSIS — Z79899 Other long term (current) drug therapy: Secondary | ICD-10-CM | POA: Diagnosis not present

## 2020-11-05 DIAGNOSIS — R2689 Other abnormalities of gait and mobility: Secondary | ICD-10-CM | POA: Diagnosis not present

## 2020-11-05 DIAGNOSIS — Z743 Need for continuous supervision: Secondary | ICD-10-CM | POA: Diagnosis not present

## 2020-11-05 DIAGNOSIS — I517 Cardiomegaly: Secondary | ICD-10-CM | POA: Diagnosis not present

## 2020-11-05 DIAGNOSIS — J449 Chronic obstructive pulmonary disease, unspecified: Secondary | ICD-10-CM | POA: Diagnosis not present

## 2020-11-05 DIAGNOSIS — M79604 Pain in right leg: Secondary | ICD-10-CM | POA: Diagnosis not present

## 2020-11-05 DIAGNOSIS — J432 Centrilobular emphysema: Secondary | ICD-10-CM | POA: Diagnosis present

## 2020-11-05 DIAGNOSIS — I129 Hypertensive chronic kidney disease with stage 1 through stage 4 chronic kidney disease, or unspecified chronic kidney disease: Secondary | ICD-10-CM | POA: Diagnosis present

## 2020-11-05 DIAGNOSIS — I4811 Longstanding persistent atrial fibrillation: Secondary | ICD-10-CM | POA: Diagnosis present

## 2020-11-05 DIAGNOSIS — M818 Other osteoporosis without current pathological fracture: Secondary | ICD-10-CM | POA: Diagnosis not present

## 2020-11-05 DIAGNOSIS — N182 Chronic kidney disease, stage 2 (mild): Secondary | ICD-10-CM | POA: Diagnosis present

## 2020-11-05 DIAGNOSIS — L03116 Cellulitis of left lower limb: Secondary | ICD-10-CM | POA: Diagnosis present

## 2020-11-05 DIAGNOSIS — E785 Hyperlipidemia, unspecified: Secondary | ICD-10-CM | POA: Diagnosis not present

## 2020-11-05 DIAGNOSIS — G9341 Metabolic encephalopathy: Secondary | ICD-10-CM | POA: Diagnosis present

## 2020-11-05 DIAGNOSIS — I5020 Unspecified systolic (congestive) heart failure: Secondary | ICD-10-CM | POA: Diagnosis not present

## 2020-11-05 DIAGNOSIS — E1122 Type 2 diabetes mellitus with diabetic chronic kidney disease: Secondary | ICD-10-CM | POA: Diagnosis present

## 2020-11-05 DIAGNOSIS — Z7984 Long term (current) use of oral hypoglycemic drugs: Secondary | ICD-10-CM | POA: Diagnosis not present

## 2020-11-05 DIAGNOSIS — M79605 Pain in left leg: Secondary | ICD-10-CM | POA: Diagnosis not present

## 2020-11-05 DIAGNOSIS — R21 Rash and other nonspecific skin eruption: Secondary | ICD-10-CM | POA: Diagnosis not present

## 2020-11-05 DIAGNOSIS — K219 Gastro-esophageal reflux disease without esophagitis: Secondary | ICD-10-CM | POA: Diagnosis not present

## 2020-11-05 DIAGNOSIS — J9811 Atelectasis: Secondary | ICD-10-CM | POA: Diagnosis not present

## 2020-11-05 DIAGNOSIS — Z951 Presence of aortocoronary bypass graft: Secondary | ICD-10-CM | POA: Diagnosis not present

## 2020-11-05 DIAGNOSIS — M7989 Other specified soft tissue disorders: Secondary | ICD-10-CM | POA: Diagnosis not present

## 2020-11-05 DIAGNOSIS — R531 Weakness: Secondary | ICD-10-CM | POA: Diagnosis not present

## 2020-11-05 DIAGNOSIS — N183 Chronic kidney disease, stage 3 unspecified: Secondary | ICD-10-CM | POA: Diagnosis not present

## 2020-11-06 DIAGNOSIS — G9341 Metabolic encephalopathy: Secondary | ICD-10-CM

## 2020-11-09 DIAGNOSIS — M818 Other osteoporosis without current pathological fracture: Secondary | ICD-10-CM | POA: Diagnosis not present

## 2020-11-09 DIAGNOSIS — I5020 Unspecified systolic (congestive) heart failure: Secondary | ICD-10-CM | POA: Diagnosis not present

## 2020-11-09 DIAGNOSIS — M6281 Muscle weakness (generalized): Secondary | ICD-10-CM | POA: Diagnosis not present

## 2020-11-09 DIAGNOSIS — M109 Gout, unspecified: Secondary | ICD-10-CM | POA: Diagnosis not present

## 2020-11-09 DIAGNOSIS — N1831 Chronic kidney disease, stage 3a: Secondary | ICD-10-CM | POA: Diagnosis not present

## 2020-11-09 DIAGNOSIS — L03116 Cellulitis of left lower limb: Secondary | ICD-10-CM | POA: Diagnosis not present

## 2020-11-09 DIAGNOSIS — B379 Candidiasis, unspecified: Secondary | ICD-10-CM | POA: Diagnosis not present

## 2020-11-09 DIAGNOSIS — R262 Difficulty in walking, not elsewhere classified: Secondary | ICD-10-CM | POA: Diagnosis not present

## 2020-11-09 DIAGNOSIS — I509 Heart failure, unspecified: Secondary | ICD-10-CM | POA: Diagnosis not present

## 2020-11-09 DIAGNOSIS — Z79899 Other long term (current) drug therapy: Secondary | ICD-10-CM | POA: Diagnosis not present

## 2020-11-09 DIAGNOSIS — E1122 Type 2 diabetes mellitus with diabetic chronic kidney disease: Secondary | ICD-10-CM | POA: Diagnosis not present

## 2020-11-09 DIAGNOSIS — I4811 Longstanding persistent atrial fibrillation: Secondary | ICD-10-CM | POA: Diagnosis not present

## 2020-11-09 DIAGNOSIS — Z7902 Long term (current) use of antithrombotics/antiplatelets: Secondary | ICD-10-CM | POA: Diagnosis not present

## 2020-11-09 DIAGNOSIS — K219 Gastro-esophageal reflux disease without esophagitis: Secondary | ICD-10-CM | POA: Diagnosis not present

## 2020-11-09 DIAGNOSIS — E039 Hypothyroidism, unspecified: Secondary | ICD-10-CM | POA: Diagnosis not present

## 2020-11-09 DIAGNOSIS — I480 Paroxysmal atrial fibrillation: Secondary | ICD-10-CM | POA: Diagnosis not present

## 2020-11-09 DIAGNOSIS — R278 Other lack of coordination: Secondary | ICD-10-CM | POA: Diagnosis not present

## 2020-11-09 DIAGNOSIS — I2510 Atherosclerotic heart disease of native coronary artery without angina pectoris: Secondary | ICD-10-CM | POA: Diagnosis not present

## 2020-11-09 DIAGNOSIS — Z743 Need for continuous supervision: Secondary | ICD-10-CM | POA: Diagnosis not present

## 2020-11-09 DIAGNOSIS — J432 Centrilobular emphysema: Secondary | ICD-10-CM | POA: Diagnosis not present

## 2020-11-09 DIAGNOSIS — I129 Hypertensive chronic kidney disease with stage 1 through stage 4 chronic kidney disease, or unspecified chronic kidney disease: Secondary | ICD-10-CM | POA: Diagnosis not present

## 2020-11-09 DIAGNOSIS — Z7984 Long term (current) use of oral hypoglycemic drugs: Secondary | ICD-10-CM | POA: Diagnosis not present

## 2020-11-09 DIAGNOSIS — E785 Hyperlipidemia, unspecified: Secondary | ICD-10-CM | POA: Diagnosis not present

## 2020-11-09 DIAGNOSIS — Z23 Encounter for immunization: Secondary | ICD-10-CM | POA: Diagnosis not present

## 2020-11-09 DIAGNOSIS — G9341 Metabolic encephalopathy: Secondary | ICD-10-CM | POA: Diagnosis not present

## 2020-11-09 DIAGNOSIS — R531 Weakness: Secondary | ICD-10-CM | POA: Diagnosis not present

## 2020-11-09 DIAGNOSIS — J9610 Chronic respiratory failure, unspecified whether with hypoxia or hypercapnia: Secondary | ICD-10-CM | POA: Diagnosis not present

## 2020-11-09 DIAGNOSIS — N183 Chronic kidney disease, stage 3 unspecified: Secondary | ICD-10-CM | POA: Diagnosis not present

## 2020-11-09 DIAGNOSIS — Z87891 Personal history of nicotine dependence: Secondary | ICD-10-CM | POA: Diagnosis not present

## 2020-11-09 DIAGNOSIS — Z951 Presence of aortocoronary bypass graft: Secondary | ICD-10-CM | POA: Diagnosis not present

## 2020-11-09 DIAGNOSIS — R2689 Other abnormalities of gait and mobility: Secondary | ICD-10-CM | POA: Diagnosis not present

## 2020-11-09 DIAGNOSIS — R21 Rash and other nonspecific skin eruption: Secondary | ICD-10-CM | POA: Diagnosis not present

## 2020-11-09 DIAGNOSIS — N182 Chronic kidney disease, stage 2 (mild): Secondary | ICD-10-CM | POA: Diagnosis not present

## 2020-11-09 DIAGNOSIS — J449 Chronic obstructive pulmonary disease, unspecified: Secondary | ICD-10-CM | POA: Diagnosis not present

## 2020-11-09 DIAGNOSIS — E119 Type 2 diabetes mellitus without complications: Secondary | ICD-10-CM | POA: Diagnosis not present

## 2020-11-10 DIAGNOSIS — L03116 Cellulitis of left lower limb: Secondary | ICD-10-CM | POA: Diagnosis not present

## 2020-11-10 DIAGNOSIS — N1831 Chronic kidney disease, stage 3a: Secondary | ICD-10-CM | POA: Diagnosis not present

## 2020-11-10 DIAGNOSIS — R262 Difficulty in walking, not elsewhere classified: Secondary | ICD-10-CM | POA: Diagnosis not present

## 2020-11-10 DIAGNOSIS — I509 Heart failure, unspecified: Secondary | ICD-10-CM | POA: Diagnosis not present

## 2020-11-22 DIAGNOSIS — E1122 Type 2 diabetes mellitus with diabetic chronic kidney disease: Secondary | ICD-10-CM | POA: Diagnosis not present

## 2020-11-22 DIAGNOSIS — M8008XD Age-related osteoporosis with current pathological fracture, vertebra(e), subsequent encounter for fracture with routine healing: Secondary | ICD-10-CM | POA: Diagnosis not present

## 2020-11-22 DIAGNOSIS — I4891 Unspecified atrial fibrillation: Secondary | ICD-10-CM | POA: Diagnosis not present

## 2020-11-22 DIAGNOSIS — I252 Old myocardial infarction: Secondary | ICD-10-CM | POA: Diagnosis not present

## 2020-11-22 DIAGNOSIS — K219 Gastro-esophageal reflux disease without esophagitis: Secondary | ICD-10-CM | POA: Diagnosis not present

## 2020-11-22 DIAGNOSIS — Z7901 Long term (current) use of anticoagulants: Secondary | ICD-10-CM | POA: Diagnosis not present

## 2020-11-22 DIAGNOSIS — E785 Hyperlipidemia, unspecified: Secondary | ICD-10-CM | POA: Diagnosis not present

## 2020-11-22 DIAGNOSIS — E039 Hypothyroidism, unspecified: Secondary | ICD-10-CM | POA: Diagnosis not present

## 2020-11-22 DIAGNOSIS — L03116 Cellulitis of left lower limb: Secondary | ICD-10-CM | POA: Diagnosis not present

## 2020-11-22 DIAGNOSIS — I130 Hypertensive heart and chronic kidney disease with heart failure and stage 1 through stage 4 chronic kidney disease, or unspecified chronic kidney disease: Secondary | ICD-10-CM | POA: Diagnosis not present

## 2020-11-22 DIAGNOSIS — M159 Polyosteoarthritis, unspecified: Secondary | ICD-10-CM | POA: Diagnosis not present

## 2020-11-22 DIAGNOSIS — N183 Chronic kidney disease, stage 3 unspecified: Secondary | ICD-10-CM | POA: Diagnosis not present

## 2020-11-22 DIAGNOSIS — Z7951 Long term (current) use of inhaled steroids: Secondary | ICD-10-CM | POA: Diagnosis not present

## 2020-11-22 DIAGNOSIS — J9610 Chronic respiratory failure, unspecified whether with hypoxia or hypercapnia: Secondary | ICD-10-CM | POA: Diagnosis not present

## 2020-11-22 DIAGNOSIS — Z7984 Long term (current) use of oral hypoglycemic drugs: Secondary | ICD-10-CM | POA: Diagnosis not present

## 2020-11-22 DIAGNOSIS — M109 Gout, unspecified: Secondary | ICD-10-CM | POA: Diagnosis not present

## 2020-11-22 DIAGNOSIS — Z8551 Personal history of malignant neoplasm of bladder: Secondary | ICD-10-CM | POA: Diagnosis not present

## 2020-11-22 DIAGNOSIS — J441 Chronic obstructive pulmonary disease with (acute) exacerbation: Secondary | ICD-10-CM | POA: Diagnosis not present

## 2020-11-22 DIAGNOSIS — I5022 Chronic systolic (congestive) heart failure: Secondary | ICD-10-CM | POA: Diagnosis not present

## 2020-11-22 DIAGNOSIS — I2510 Atherosclerotic heart disease of native coronary artery without angina pectoris: Secondary | ICD-10-CM | POA: Diagnosis not present

## 2020-11-26 DIAGNOSIS — N183 Chronic kidney disease, stage 3 unspecified: Secondary | ICD-10-CM | POA: Diagnosis not present

## 2020-11-26 DIAGNOSIS — L03116 Cellulitis of left lower limb: Secondary | ICD-10-CM | POA: Diagnosis not present

## 2020-11-26 DIAGNOSIS — I130 Hypertensive heart and chronic kidney disease with heart failure and stage 1 through stage 4 chronic kidney disease, or unspecified chronic kidney disease: Secondary | ICD-10-CM | POA: Diagnosis not present

## 2020-11-26 DIAGNOSIS — I252 Old myocardial infarction: Secondary | ICD-10-CM | POA: Diagnosis not present

## 2020-11-26 DIAGNOSIS — I2510 Atherosclerotic heart disease of native coronary artery without angina pectoris: Secondary | ICD-10-CM | POA: Diagnosis not present

## 2020-11-26 DIAGNOSIS — E1122 Type 2 diabetes mellitus with diabetic chronic kidney disease: Secondary | ICD-10-CM | POA: Diagnosis not present

## 2020-11-27 DIAGNOSIS — I252 Old myocardial infarction: Secondary | ICD-10-CM | POA: Diagnosis not present

## 2020-11-27 DIAGNOSIS — N183 Chronic kidney disease, stage 3 unspecified: Secondary | ICD-10-CM | POA: Diagnosis not present

## 2020-11-27 DIAGNOSIS — L03116 Cellulitis of left lower limb: Secondary | ICD-10-CM | POA: Diagnosis not present

## 2020-11-27 DIAGNOSIS — I2510 Atherosclerotic heart disease of native coronary artery without angina pectoris: Secondary | ICD-10-CM | POA: Diagnosis not present

## 2020-11-27 DIAGNOSIS — I130 Hypertensive heart and chronic kidney disease with heart failure and stage 1 through stage 4 chronic kidney disease, or unspecified chronic kidney disease: Secondary | ICD-10-CM | POA: Diagnosis not present

## 2020-11-27 DIAGNOSIS — E1122 Type 2 diabetes mellitus with diabetic chronic kidney disease: Secondary | ICD-10-CM | POA: Diagnosis not present

## 2020-11-29 DIAGNOSIS — Z6825 Body mass index (BMI) 25.0-25.9, adult: Secondary | ICD-10-CM | POA: Diagnosis not present

## 2020-11-29 DIAGNOSIS — Z09 Encounter for follow-up examination after completed treatment for conditions other than malignant neoplasm: Secondary | ICD-10-CM | POA: Diagnosis not present

## 2020-11-29 DIAGNOSIS — L03119 Cellulitis of unspecified part of limb: Secondary | ICD-10-CM | POA: Diagnosis not present

## 2020-11-29 DIAGNOSIS — Z79899 Other long term (current) drug therapy: Secondary | ICD-10-CM | POA: Diagnosis not present

## 2020-12-03 DIAGNOSIS — L03116 Cellulitis of left lower limb: Secondary | ICD-10-CM | POA: Diagnosis not present

## 2020-12-03 DIAGNOSIS — I252 Old myocardial infarction: Secondary | ICD-10-CM | POA: Diagnosis not present

## 2020-12-03 DIAGNOSIS — E1122 Type 2 diabetes mellitus with diabetic chronic kidney disease: Secondary | ICD-10-CM | POA: Diagnosis not present

## 2020-12-03 DIAGNOSIS — N183 Chronic kidney disease, stage 3 unspecified: Secondary | ICD-10-CM | POA: Diagnosis not present

## 2020-12-03 DIAGNOSIS — I2510 Atherosclerotic heart disease of native coronary artery without angina pectoris: Secondary | ICD-10-CM | POA: Diagnosis not present

## 2020-12-03 DIAGNOSIS — I130 Hypertensive heart and chronic kidney disease with heart failure and stage 1 through stage 4 chronic kidney disease, or unspecified chronic kidney disease: Secondary | ICD-10-CM | POA: Diagnosis not present

## 2020-12-05 DIAGNOSIS — I252 Old myocardial infarction: Secondary | ICD-10-CM | POA: Diagnosis not present

## 2020-12-05 DIAGNOSIS — L03116 Cellulitis of left lower limb: Secondary | ICD-10-CM | POA: Diagnosis not present

## 2020-12-05 DIAGNOSIS — N183 Chronic kidney disease, stage 3 unspecified: Secondary | ICD-10-CM | POA: Diagnosis not present

## 2020-12-05 DIAGNOSIS — I130 Hypertensive heart and chronic kidney disease with heart failure and stage 1 through stage 4 chronic kidney disease, or unspecified chronic kidney disease: Secondary | ICD-10-CM | POA: Diagnosis not present

## 2020-12-05 DIAGNOSIS — E1122 Type 2 diabetes mellitus with diabetic chronic kidney disease: Secondary | ICD-10-CM | POA: Diagnosis not present

## 2020-12-05 DIAGNOSIS — I2510 Atherosclerotic heart disease of native coronary artery without angina pectoris: Secondary | ICD-10-CM | POA: Diagnosis not present

## 2020-12-10 DIAGNOSIS — I252 Old myocardial infarction: Secondary | ICD-10-CM | POA: Diagnosis not present

## 2020-12-10 DIAGNOSIS — C44222 Squamous cell carcinoma of skin of right ear and external auricular canal: Secondary | ICD-10-CM | POA: Diagnosis not present

## 2020-12-10 DIAGNOSIS — E1122 Type 2 diabetes mellitus with diabetic chronic kidney disease: Secondary | ICD-10-CM | POA: Diagnosis not present

## 2020-12-10 DIAGNOSIS — N183 Chronic kidney disease, stage 3 unspecified: Secondary | ICD-10-CM | POA: Diagnosis not present

## 2020-12-10 DIAGNOSIS — C44729 Squamous cell carcinoma of skin of left lower limb, including hip: Secondary | ICD-10-CM | POA: Diagnosis not present

## 2020-12-10 DIAGNOSIS — L570 Actinic keratosis: Secondary | ICD-10-CM | POA: Diagnosis not present

## 2020-12-10 DIAGNOSIS — L03116 Cellulitis of left lower limb: Secondary | ICD-10-CM | POA: Diagnosis not present

## 2020-12-10 DIAGNOSIS — I2510 Atherosclerotic heart disease of native coronary artery without angina pectoris: Secondary | ICD-10-CM | POA: Diagnosis not present

## 2020-12-10 DIAGNOSIS — I130 Hypertensive heart and chronic kidney disease with heart failure and stage 1 through stage 4 chronic kidney disease, or unspecified chronic kidney disease: Secondary | ICD-10-CM | POA: Diagnosis not present

## 2020-12-12 ENCOUNTER — Telehealth: Payer: Self-pay | Admitting: Cardiology

## 2020-12-12 DIAGNOSIS — L03116 Cellulitis of left lower limb: Secondary | ICD-10-CM | POA: Diagnosis not present

## 2020-12-12 DIAGNOSIS — N183 Chronic kidney disease, stage 3 unspecified: Secondary | ICD-10-CM | POA: Diagnosis not present

## 2020-12-12 DIAGNOSIS — I252 Old myocardial infarction: Secondary | ICD-10-CM | POA: Diagnosis not present

## 2020-12-12 DIAGNOSIS — I2510 Atherosclerotic heart disease of native coronary artery without angina pectoris: Secondary | ICD-10-CM | POA: Diagnosis not present

## 2020-12-12 DIAGNOSIS — I130 Hypertensive heart and chronic kidney disease with heart failure and stage 1 through stage 4 chronic kidney disease, or unspecified chronic kidney disease: Secondary | ICD-10-CM | POA: Diagnosis not present

## 2020-12-12 DIAGNOSIS — E1122 Type 2 diabetes mellitus with diabetic chronic kidney disease: Secondary | ICD-10-CM | POA: Diagnosis not present

## 2020-12-12 NOTE — Telephone Encounter (Signed)
STAT if HR is under 50 or over 120 (normal HR is 60-100 beats per minute)  1) What is your heart rate? 115-125  2) Do you have a log of your heart rate readings (document readings)? no  3) Do you have any other symptoms? Pt reported an afib flare yesterday but those symptoms have resolved   [facility] Nurse is with the patient right now. The patient is having fluctuating HR and the HHN is just calling to report it

## 2020-12-12 NOTE — Telephone Encounter (Signed)
Returned call to patient. He states he is eating lunch and states the [facility] is no longer with him. He reports his HR was as high as 125 bpm on the pulse ox but it came down in the lower 100s. He does not know what it is at this time and denies complaints. Reports he forgot to take metoprolol and eliquis yesterday but has resumed those medications today. I asked about hydration and he reports he drinks approximately 2 quarts total daily of water, Pepsi, and milk. I advised him that missing metoprolol may have caused an increase in his HR and to continue to monitor; advised him to call back with questions or concerns. He is overdue for his 6 month follow-up with Dr. Agustin Cree. I scheduled soonest appointment for March and advised that I will forward message to Dr. Agustin Cree for his awareness. Patient requests sooner appointment if anything becomes available. He thanked me for the call.

## 2020-12-18 DIAGNOSIS — E1122 Type 2 diabetes mellitus with diabetic chronic kidney disease: Secondary | ICD-10-CM | POA: Diagnosis not present

## 2020-12-18 DIAGNOSIS — I2510 Atherosclerotic heart disease of native coronary artery without angina pectoris: Secondary | ICD-10-CM | POA: Diagnosis not present

## 2020-12-18 DIAGNOSIS — I252 Old myocardial infarction: Secondary | ICD-10-CM | POA: Diagnosis not present

## 2020-12-18 DIAGNOSIS — N183 Chronic kidney disease, stage 3 unspecified: Secondary | ICD-10-CM | POA: Diagnosis not present

## 2020-12-18 DIAGNOSIS — I130 Hypertensive heart and chronic kidney disease with heart failure and stage 1 through stage 4 chronic kidney disease, or unspecified chronic kidney disease: Secondary | ICD-10-CM | POA: Diagnosis not present

## 2020-12-18 DIAGNOSIS — L03116 Cellulitis of left lower limb: Secondary | ICD-10-CM | POA: Diagnosis not present

## 2020-12-20 DIAGNOSIS — N179 Acute kidney failure, unspecified: Secondary | ICD-10-CM | POA: Diagnosis not present

## 2020-12-20 DIAGNOSIS — N183 Chronic kidney disease, stage 3 unspecified: Secondary | ICD-10-CM | POA: Diagnosis not present

## 2020-12-20 DIAGNOSIS — E1122 Type 2 diabetes mellitus with diabetic chronic kidney disease: Secondary | ICD-10-CM | POA: Diagnosis not present

## 2020-12-20 DIAGNOSIS — J439 Emphysema, unspecified: Secondary | ICD-10-CM | POA: Diagnosis not present

## 2020-12-20 DIAGNOSIS — L03116 Cellulitis of left lower limb: Secondary | ICD-10-CM | POA: Diagnosis not present

## 2020-12-20 DIAGNOSIS — R410 Disorientation, unspecified: Secondary | ICD-10-CM | POA: Diagnosis not present

## 2020-12-20 DIAGNOSIS — R0902 Hypoxemia: Secondary | ICD-10-CM | POA: Diagnosis not present

## 2020-12-20 DIAGNOSIS — I2510 Atherosclerotic heart disease of native coronary artery without angina pectoris: Secondary | ICD-10-CM | POA: Diagnosis not present

## 2020-12-20 DIAGNOSIS — I517 Cardiomegaly: Secondary | ICD-10-CM | POA: Diagnosis not present

## 2020-12-20 DIAGNOSIS — I130 Hypertensive heart and chronic kidney disease with heart failure and stage 1 through stage 4 chronic kidney disease, or unspecified chronic kidney disease: Secondary | ICD-10-CM | POA: Diagnosis not present

## 2020-12-20 DIAGNOSIS — I252 Old myocardial infarction: Secondary | ICD-10-CM | POA: Diagnosis not present

## 2020-12-20 DIAGNOSIS — J189 Pneumonia, unspecified organism: Secondary | ICD-10-CM | POA: Diagnosis not present

## 2020-12-21 DIAGNOSIS — Z7984 Long term (current) use of oral hypoglycemic drugs: Secondary | ICD-10-CM | POA: Diagnosis not present

## 2020-12-21 DIAGNOSIS — M159 Polyosteoarthritis, unspecified: Secondary | ICD-10-CM | POA: Diagnosis not present

## 2020-12-21 DIAGNOSIS — E1122 Type 2 diabetes mellitus with diabetic chronic kidney disease: Secondary | ICD-10-CM | POA: Diagnosis not present

## 2020-12-21 DIAGNOSIS — I129 Hypertensive chronic kidney disease with stage 1 through stage 4 chronic kidney disease, or unspecified chronic kidney disease: Secondary | ICD-10-CM | POA: Diagnosis present

## 2020-12-21 DIAGNOSIS — K219 Gastro-esophageal reflux disease without esophagitis: Secondary | ICD-10-CM | POA: Diagnosis not present

## 2020-12-21 DIAGNOSIS — Z8551 Personal history of malignant neoplasm of bladder: Secondary | ICD-10-CM | POA: Diagnosis not present

## 2020-12-21 DIAGNOSIS — J9601 Acute respiratory failure with hypoxia: Secondary | ICD-10-CM | POA: Diagnosis present

## 2020-12-21 DIAGNOSIS — J189 Pneumonia, unspecified organism: Secondary | ICD-10-CM | POA: Diagnosis not present

## 2020-12-21 DIAGNOSIS — N189 Chronic kidney disease, unspecified: Secondary | ICD-10-CM | POA: Diagnosis present

## 2020-12-21 DIAGNOSIS — E785 Hyperlipidemia, unspecified: Secondary | ICD-10-CM | POA: Diagnosis not present

## 2020-12-21 DIAGNOSIS — J439 Emphysema, unspecified: Secondary | ICD-10-CM | POA: Diagnosis not present

## 2020-12-21 DIAGNOSIS — I517 Cardiomegaly: Secondary | ICD-10-CM | POA: Diagnosis not present

## 2020-12-21 DIAGNOSIS — R0902 Hypoxemia: Secondary | ICD-10-CM | POA: Diagnosis not present

## 2020-12-21 DIAGNOSIS — I480 Paroxysmal atrial fibrillation: Secondary | ICD-10-CM | POA: Diagnosis present

## 2020-12-21 DIAGNOSIS — J441 Chronic obstructive pulmonary disease with (acute) exacerbation: Secondary | ICD-10-CM | POA: Diagnosis present

## 2020-12-21 DIAGNOSIS — M109 Gout, unspecified: Secondary | ICD-10-CM | POA: Diagnosis not present

## 2020-12-21 DIAGNOSIS — E039 Hypothyroidism, unspecified: Secondary | ICD-10-CM | POA: Diagnosis present

## 2020-12-21 DIAGNOSIS — I130 Hypertensive heart and chronic kidney disease with heart failure and stage 1 through stage 4 chronic kidney disease, or unspecified chronic kidney disease: Secondary | ICD-10-CM | POA: Diagnosis not present

## 2020-12-21 DIAGNOSIS — Z7951 Long term (current) use of inhaled steroids: Secondary | ICD-10-CM | POA: Diagnosis not present

## 2020-12-21 DIAGNOSIS — I5022 Chronic systolic (congestive) heart failure: Secondary | ICD-10-CM | POA: Diagnosis not present

## 2020-12-21 DIAGNOSIS — J9610 Chronic respiratory failure, unspecified whether with hypoxia or hypercapnia: Secondary | ICD-10-CM | POA: Diagnosis not present

## 2020-12-21 DIAGNOSIS — M1990 Unspecified osteoarthritis, unspecified site: Secondary | ICD-10-CM | POA: Diagnosis present

## 2020-12-21 DIAGNOSIS — J440 Chronic obstructive pulmonary disease with acute lower respiratory infection: Secondary | ICD-10-CM | POA: Diagnosis present

## 2020-12-21 DIAGNOSIS — Z7901 Long term (current) use of anticoagulants: Secondary | ICD-10-CM | POA: Diagnosis not present

## 2020-12-21 DIAGNOSIS — I252 Old myocardial infarction: Secondary | ICD-10-CM | POA: Diagnosis not present

## 2020-12-21 DIAGNOSIS — R410 Disorientation, unspecified: Secondary | ICD-10-CM | POA: Diagnosis not present

## 2020-12-21 DIAGNOSIS — L03116 Cellulitis of left lower limb: Secondary | ICD-10-CM | POA: Diagnosis not present

## 2020-12-21 DIAGNOSIS — Z79899 Other long term (current) drug therapy: Secondary | ICD-10-CM | POA: Diagnosis not present

## 2020-12-21 DIAGNOSIS — M8008XD Age-related osteoporosis with current pathological fracture, vertebra(e), subsequent encounter for fracture with routine healing: Secondary | ICD-10-CM | POA: Diagnosis not present

## 2020-12-21 DIAGNOSIS — Z951 Presence of aortocoronary bypass graft: Secondary | ICD-10-CM | POA: Diagnosis not present

## 2020-12-21 DIAGNOSIS — I4891 Unspecified atrial fibrillation: Secondary | ICD-10-CM | POA: Diagnosis not present

## 2020-12-21 DIAGNOSIS — R4182 Altered mental status, unspecified: Secondary | ICD-10-CM | POA: Diagnosis present

## 2020-12-21 DIAGNOSIS — Z87891 Personal history of nicotine dependence: Secondary | ICD-10-CM | POA: Diagnosis not present

## 2020-12-21 DIAGNOSIS — I2510 Atherosclerotic heart disease of native coronary artery without angina pectoris: Secondary | ICD-10-CM | POA: Diagnosis present

## 2020-12-21 DIAGNOSIS — N179 Acute kidney failure, unspecified: Secondary | ICD-10-CM | POA: Diagnosis not present

## 2020-12-21 DIAGNOSIS — N183 Chronic kidney disease, stage 3 unspecified: Secondary | ICD-10-CM | POA: Diagnosis not present

## 2020-12-22 DIAGNOSIS — J9601 Acute respiratory failure with hypoxia: Secondary | ICD-10-CM | POA: Diagnosis not present

## 2020-12-22 DIAGNOSIS — J189 Pneumonia, unspecified organism: Secondary | ICD-10-CM | POA: Diagnosis not present

## 2020-12-22 DIAGNOSIS — N179 Acute kidney failure, unspecified: Secondary | ICD-10-CM | POA: Diagnosis not present

## 2020-12-23 DIAGNOSIS — J189 Pneumonia, unspecified organism: Secondary | ICD-10-CM | POA: Diagnosis not present

## 2020-12-23 DIAGNOSIS — J9601 Acute respiratory failure with hypoxia: Secondary | ICD-10-CM | POA: Diagnosis not present

## 2020-12-23 DIAGNOSIS — N179 Acute kidney failure, unspecified: Secondary | ICD-10-CM | POA: Diagnosis not present

## 2020-12-25 DIAGNOSIS — N183 Chronic kidney disease, stage 3 unspecified: Secondary | ICD-10-CM | POA: Diagnosis not present

## 2020-12-25 DIAGNOSIS — I252 Old myocardial infarction: Secondary | ICD-10-CM | POA: Diagnosis not present

## 2020-12-25 DIAGNOSIS — I130 Hypertensive heart and chronic kidney disease with heart failure and stage 1 through stage 4 chronic kidney disease, or unspecified chronic kidney disease: Secondary | ICD-10-CM | POA: Diagnosis not present

## 2020-12-25 DIAGNOSIS — I2510 Atherosclerotic heart disease of native coronary artery without angina pectoris: Secondary | ICD-10-CM | POA: Diagnosis not present

## 2020-12-25 DIAGNOSIS — E1122 Type 2 diabetes mellitus with diabetic chronic kidney disease: Secondary | ICD-10-CM | POA: Diagnosis not present

## 2020-12-25 DIAGNOSIS — L03116 Cellulitis of left lower limb: Secondary | ICD-10-CM | POA: Diagnosis not present

## 2020-12-26 DIAGNOSIS — N183 Chronic kidney disease, stage 3 unspecified: Secondary | ICD-10-CM | POA: Diagnosis not present

## 2020-12-26 DIAGNOSIS — I252 Old myocardial infarction: Secondary | ICD-10-CM | POA: Diagnosis not present

## 2020-12-26 DIAGNOSIS — E1122 Type 2 diabetes mellitus with diabetic chronic kidney disease: Secondary | ICD-10-CM | POA: Diagnosis not present

## 2020-12-26 DIAGNOSIS — L03116 Cellulitis of left lower limb: Secondary | ICD-10-CM | POA: Diagnosis not present

## 2020-12-26 DIAGNOSIS — I2510 Atherosclerotic heart disease of native coronary artery without angina pectoris: Secondary | ICD-10-CM | POA: Diagnosis not present

## 2020-12-26 DIAGNOSIS — I130 Hypertensive heart and chronic kidney disease with heart failure and stage 1 through stage 4 chronic kidney disease, or unspecified chronic kidney disease: Secondary | ICD-10-CM | POA: Diagnosis not present

## 2020-12-27 DIAGNOSIS — L03116 Cellulitis of left lower limb: Secondary | ICD-10-CM | POA: Diagnosis not present

## 2020-12-27 DIAGNOSIS — I130 Hypertensive heart and chronic kidney disease with heart failure and stage 1 through stage 4 chronic kidney disease, or unspecified chronic kidney disease: Secondary | ICD-10-CM | POA: Diagnosis not present

## 2020-12-27 DIAGNOSIS — I252 Old myocardial infarction: Secondary | ICD-10-CM | POA: Diagnosis not present

## 2020-12-27 DIAGNOSIS — I2510 Atherosclerotic heart disease of native coronary artery without angina pectoris: Secondary | ICD-10-CM | POA: Diagnosis not present

## 2020-12-27 DIAGNOSIS — N183 Chronic kidney disease, stage 3 unspecified: Secondary | ICD-10-CM | POA: Diagnosis not present

## 2020-12-27 DIAGNOSIS — E1122 Type 2 diabetes mellitus with diabetic chronic kidney disease: Secondary | ICD-10-CM | POA: Diagnosis not present

## 2020-12-28 DIAGNOSIS — E1122 Type 2 diabetes mellitus with diabetic chronic kidney disease: Secondary | ICD-10-CM | POA: Diagnosis not present

## 2020-12-28 DIAGNOSIS — N183 Chronic kidney disease, stage 3 unspecified: Secondary | ICD-10-CM | POA: Diagnosis not present

## 2020-12-28 DIAGNOSIS — I2510 Atherosclerotic heart disease of native coronary artery without angina pectoris: Secondary | ICD-10-CM | POA: Diagnosis not present

## 2020-12-28 DIAGNOSIS — I130 Hypertensive heart and chronic kidney disease with heart failure and stage 1 through stage 4 chronic kidney disease, or unspecified chronic kidney disease: Secondary | ICD-10-CM | POA: Diagnosis not present

## 2020-12-28 DIAGNOSIS — L03116 Cellulitis of left lower limb: Secondary | ICD-10-CM | POA: Diagnosis not present

## 2020-12-28 DIAGNOSIS — I252 Old myocardial infarction: Secondary | ICD-10-CM | POA: Diagnosis not present

## 2020-12-31 DIAGNOSIS — I2510 Atherosclerotic heart disease of native coronary artery without angina pectoris: Secondary | ICD-10-CM | POA: Diagnosis not present

## 2020-12-31 DIAGNOSIS — E1122 Type 2 diabetes mellitus with diabetic chronic kidney disease: Secondary | ICD-10-CM | POA: Diagnosis not present

## 2020-12-31 DIAGNOSIS — L03116 Cellulitis of left lower limb: Secondary | ICD-10-CM | POA: Diagnosis not present

## 2020-12-31 DIAGNOSIS — I252 Old myocardial infarction: Secondary | ICD-10-CM | POA: Diagnosis not present

## 2020-12-31 DIAGNOSIS — N183 Chronic kidney disease, stage 3 unspecified: Secondary | ICD-10-CM | POA: Diagnosis not present

## 2020-12-31 DIAGNOSIS — I130 Hypertensive heart and chronic kidney disease with heart failure and stage 1 through stage 4 chronic kidney disease, or unspecified chronic kidney disease: Secondary | ICD-10-CM | POA: Diagnosis not present

## 2021-01-01 DIAGNOSIS — C44622 Squamous cell carcinoma of skin of right upper limb, including shoulder: Secondary | ICD-10-CM | POA: Diagnosis not present

## 2021-01-02 DIAGNOSIS — L03116 Cellulitis of left lower limb: Secondary | ICD-10-CM | POA: Diagnosis not present

## 2021-01-02 DIAGNOSIS — I2510 Atherosclerotic heart disease of native coronary artery without angina pectoris: Secondary | ICD-10-CM | POA: Diagnosis not present

## 2021-01-02 DIAGNOSIS — I130 Hypertensive heart and chronic kidney disease with heart failure and stage 1 through stage 4 chronic kidney disease, or unspecified chronic kidney disease: Secondary | ICD-10-CM | POA: Diagnosis not present

## 2021-01-02 DIAGNOSIS — E1122 Type 2 diabetes mellitus with diabetic chronic kidney disease: Secondary | ICD-10-CM | POA: Diagnosis not present

## 2021-01-02 DIAGNOSIS — N183 Chronic kidney disease, stage 3 unspecified: Secondary | ICD-10-CM | POA: Diagnosis not present

## 2021-01-02 DIAGNOSIS — I252 Old myocardial infarction: Secondary | ICD-10-CM | POA: Diagnosis not present

## 2021-01-07 DIAGNOSIS — I130 Hypertensive heart and chronic kidney disease with heart failure and stage 1 through stage 4 chronic kidney disease, or unspecified chronic kidney disease: Secondary | ICD-10-CM | POA: Diagnosis not present

## 2021-01-07 DIAGNOSIS — I2510 Atherosclerotic heart disease of native coronary artery without angina pectoris: Secondary | ICD-10-CM | POA: Diagnosis not present

## 2021-01-07 DIAGNOSIS — N183 Chronic kidney disease, stage 3 unspecified: Secondary | ICD-10-CM | POA: Diagnosis not present

## 2021-01-07 DIAGNOSIS — E1122 Type 2 diabetes mellitus with diabetic chronic kidney disease: Secondary | ICD-10-CM | POA: Diagnosis not present

## 2021-01-07 DIAGNOSIS — I252 Old myocardial infarction: Secondary | ICD-10-CM | POA: Diagnosis not present

## 2021-01-07 DIAGNOSIS — L03116 Cellulitis of left lower limb: Secondary | ICD-10-CM | POA: Diagnosis not present

## 2021-01-08 DIAGNOSIS — E1122 Type 2 diabetes mellitus with diabetic chronic kidney disease: Secondary | ICD-10-CM | POA: Diagnosis not present

## 2021-01-08 DIAGNOSIS — I2510 Atherosclerotic heart disease of native coronary artery without angina pectoris: Secondary | ICD-10-CM | POA: Diagnosis not present

## 2021-01-08 DIAGNOSIS — I252 Old myocardial infarction: Secondary | ICD-10-CM | POA: Diagnosis not present

## 2021-01-08 DIAGNOSIS — N183 Chronic kidney disease, stage 3 unspecified: Secondary | ICD-10-CM | POA: Diagnosis not present

## 2021-01-08 DIAGNOSIS — I130 Hypertensive heart and chronic kidney disease with heart failure and stage 1 through stage 4 chronic kidney disease, or unspecified chronic kidney disease: Secondary | ICD-10-CM | POA: Diagnosis not present

## 2021-01-08 DIAGNOSIS — L03116 Cellulitis of left lower limb: Secondary | ICD-10-CM | POA: Diagnosis not present

## 2021-01-09 DIAGNOSIS — N183 Chronic kidney disease, stage 3 unspecified: Secondary | ICD-10-CM | POA: Diagnosis not present

## 2021-01-09 DIAGNOSIS — I252 Old myocardial infarction: Secondary | ICD-10-CM | POA: Diagnosis not present

## 2021-01-09 DIAGNOSIS — I2510 Atherosclerotic heart disease of native coronary artery without angina pectoris: Secondary | ICD-10-CM | POA: Diagnosis not present

## 2021-01-09 DIAGNOSIS — L03116 Cellulitis of left lower limb: Secondary | ICD-10-CM | POA: Diagnosis not present

## 2021-01-09 DIAGNOSIS — E1122 Type 2 diabetes mellitus with diabetic chronic kidney disease: Secondary | ICD-10-CM | POA: Diagnosis not present

## 2021-01-09 DIAGNOSIS — I130 Hypertensive heart and chronic kidney disease with heart failure and stage 1 through stage 4 chronic kidney disease, or unspecified chronic kidney disease: Secondary | ICD-10-CM | POA: Diagnosis not present

## 2021-01-10 DIAGNOSIS — C44622 Squamous cell carcinoma of skin of right upper limb, including shoulder: Secondary | ICD-10-CM | POA: Diagnosis not present

## 2021-01-15 DIAGNOSIS — L03116 Cellulitis of left lower limb: Secondary | ICD-10-CM | POA: Diagnosis not present

## 2021-01-15 DIAGNOSIS — I252 Old myocardial infarction: Secondary | ICD-10-CM | POA: Diagnosis not present

## 2021-01-15 DIAGNOSIS — E1122 Type 2 diabetes mellitus with diabetic chronic kidney disease: Secondary | ICD-10-CM | POA: Diagnosis not present

## 2021-01-15 DIAGNOSIS — I130 Hypertensive heart and chronic kidney disease with heart failure and stage 1 through stage 4 chronic kidney disease, or unspecified chronic kidney disease: Secondary | ICD-10-CM | POA: Diagnosis not present

## 2021-01-15 DIAGNOSIS — I2510 Atherosclerotic heart disease of native coronary artery without angina pectoris: Secondary | ICD-10-CM | POA: Diagnosis not present

## 2021-01-15 DIAGNOSIS — N183 Chronic kidney disease, stage 3 unspecified: Secondary | ICD-10-CM | POA: Diagnosis not present

## 2021-01-16 DIAGNOSIS — L03116 Cellulitis of left lower limb: Secondary | ICD-10-CM | POA: Diagnosis not present

## 2021-01-16 DIAGNOSIS — N183 Chronic kidney disease, stage 3 unspecified: Secondary | ICD-10-CM | POA: Diagnosis not present

## 2021-01-16 DIAGNOSIS — E1122 Type 2 diabetes mellitus with diabetic chronic kidney disease: Secondary | ICD-10-CM | POA: Diagnosis not present

## 2021-01-16 DIAGNOSIS — I130 Hypertensive heart and chronic kidney disease with heart failure and stage 1 through stage 4 chronic kidney disease, or unspecified chronic kidney disease: Secondary | ICD-10-CM | POA: Diagnosis not present

## 2021-01-16 DIAGNOSIS — I2510 Atherosclerotic heart disease of native coronary artery without angina pectoris: Secondary | ICD-10-CM | POA: Diagnosis not present

## 2021-01-16 DIAGNOSIS — I252 Old myocardial infarction: Secondary | ICD-10-CM | POA: Diagnosis not present

## 2021-01-18 DIAGNOSIS — N183 Chronic kidney disease, stage 3 unspecified: Secondary | ICD-10-CM | POA: Diagnosis not present

## 2021-01-18 DIAGNOSIS — L03116 Cellulitis of left lower limb: Secondary | ICD-10-CM | POA: Diagnosis not present

## 2021-01-18 DIAGNOSIS — I252 Old myocardial infarction: Secondary | ICD-10-CM | POA: Diagnosis not present

## 2021-01-18 DIAGNOSIS — I2510 Atherosclerotic heart disease of native coronary artery without angina pectoris: Secondary | ICD-10-CM | POA: Diagnosis not present

## 2021-01-18 DIAGNOSIS — I130 Hypertensive heart and chronic kidney disease with heart failure and stage 1 through stage 4 chronic kidney disease, or unspecified chronic kidney disease: Secondary | ICD-10-CM | POA: Diagnosis not present

## 2021-01-18 DIAGNOSIS — E1122 Type 2 diabetes mellitus with diabetic chronic kidney disease: Secondary | ICD-10-CM | POA: Diagnosis not present

## 2021-01-21 DIAGNOSIS — L03116 Cellulitis of left lower limb: Secondary | ICD-10-CM | POA: Diagnosis not present

## 2021-01-21 DIAGNOSIS — Z7951 Long term (current) use of inhaled steroids: Secondary | ICD-10-CM | POA: Diagnosis not present

## 2021-01-21 DIAGNOSIS — Z87891 Personal history of nicotine dependence: Secondary | ICD-10-CM | POA: Diagnosis not present

## 2021-01-21 DIAGNOSIS — N183 Chronic kidney disease, stage 3 unspecified: Secondary | ICD-10-CM | POA: Diagnosis not present

## 2021-01-21 DIAGNOSIS — E039 Hypothyroidism, unspecified: Secondary | ICD-10-CM | POA: Diagnosis not present

## 2021-01-21 DIAGNOSIS — J440 Chronic obstructive pulmonary disease with acute lower respiratory infection: Secondary | ICD-10-CM | POA: Diagnosis not present

## 2021-01-21 DIAGNOSIS — E785 Hyperlipidemia, unspecified: Secondary | ICD-10-CM | POA: Diagnosis not present

## 2021-01-21 DIAGNOSIS — M109 Gout, unspecified: Secondary | ICD-10-CM | POA: Diagnosis not present

## 2021-01-21 DIAGNOSIS — Z8551 Personal history of malignant neoplasm of bladder: Secondary | ICD-10-CM | POA: Diagnosis not present

## 2021-01-21 DIAGNOSIS — N179 Acute kidney failure, unspecified: Secondary | ICD-10-CM | POA: Diagnosis not present

## 2021-01-21 DIAGNOSIS — K219 Gastro-esophageal reflux disease without esophagitis: Secondary | ICD-10-CM | POA: Diagnosis not present

## 2021-01-21 DIAGNOSIS — J189 Pneumonia, unspecified organism: Secondary | ICD-10-CM | POA: Diagnosis not present

## 2021-01-21 DIAGNOSIS — M1990 Unspecified osteoarthritis, unspecified site: Secondary | ICD-10-CM | POA: Diagnosis not present

## 2021-01-21 DIAGNOSIS — M069 Rheumatoid arthritis, unspecified: Secondary | ICD-10-CM | POA: Diagnosis not present

## 2021-01-21 DIAGNOSIS — I252 Old myocardial infarction: Secondary | ICD-10-CM | POA: Diagnosis not present

## 2021-01-21 DIAGNOSIS — I480 Paroxysmal atrial fibrillation: Secondary | ICD-10-CM | POA: Diagnosis not present

## 2021-01-21 DIAGNOSIS — J441 Chronic obstructive pulmonary disease with (acute) exacerbation: Secondary | ICD-10-CM | POA: Diagnosis not present

## 2021-01-21 DIAGNOSIS — Z7984 Long term (current) use of oral hypoglycemic drugs: Secondary | ICD-10-CM | POA: Diagnosis not present

## 2021-01-21 DIAGNOSIS — E1122 Type 2 diabetes mellitus with diabetic chronic kidney disease: Secondary | ICD-10-CM | POA: Diagnosis not present

## 2021-01-21 DIAGNOSIS — I130 Hypertensive heart and chronic kidney disease with heart failure and stage 1 through stage 4 chronic kidney disease, or unspecified chronic kidney disease: Secondary | ICD-10-CM | POA: Diagnosis not present

## 2021-01-21 DIAGNOSIS — I5022 Chronic systolic (congestive) heart failure: Secondary | ICD-10-CM | POA: Diagnosis not present

## 2021-01-21 DIAGNOSIS — Z951 Presence of aortocoronary bypass graft: Secondary | ICD-10-CM | POA: Diagnosis not present

## 2021-01-21 DIAGNOSIS — I2510 Atherosclerotic heart disease of native coronary artery without angina pectoris: Secondary | ICD-10-CM | POA: Diagnosis not present

## 2021-01-21 DIAGNOSIS — Z9981 Dependence on supplemental oxygen: Secondary | ICD-10-CM | POA: Diagnosis not present

## 2021-01-21 DIAGNOSIS — Z7901 Long term (current) use of anticoagulants: Secondary | ICD-10-CM | POA: Diagnosis not present

## 2021-01-23 DIAGNOSIS — N183 Chronic kidney disease, stage 3 unspecified: Secondary | ICD-10-CM | POA: Diagnosis not present

## 2021-01-23 DIAGNOSIS — I252 Old myocardial infarction: Secondary | ICD-10-CM | POA: Diagnosis not present

## 2021-01-23 DIAGNOSIS — E1122 Type 2 diabetes mellitus with diabetic chronic kidney disease: Secondary | ICD-10-CM | POA: Diagnosis not present

## 2021-01-23 DIAGNOSIS — I2510 Atherosclerotic heart disease of native coronary artery without angina pectoris: Secondary | ICD-10-CM | POA: Diagnosis not present

## 2021-01-23 DIAGNOSIS — J189 Pneumonia, unspecified organism: Secondary | ICD-10-CM | POA: Diagnosis not present

## 2021-01-23 DIAGNOSIS — I130 Hypertensive heart and chronic kidney disease with heart failure and stage 1 through stage 4 chronic kidney disease, or unspecified chronic kidney disease: Secondary | ICD-10-CM | POA: Diagnosis not present

## 2021-01-24 DIAGNOSIS — E1122 Type 2 diabetes mellitus with diabetic chronic kidney disease: Secondary | ICD-10-CM | POA: Diagnosis not present

## 2021-01-24 DIAGNOSIS — I252 Old myocardial infarction: Secondary | ICD-10-CM | POA: Diagnosis not present

## 2021-01-24 DIAGNOSIS — I130 Hypertensive heart and chronic kidney disease with heart failure and stage 1 through stage 4 chronic kidney disease, or unspecified chronic kidney disease: Secondary | ICD-10-CM | POA: Diagnosis not present

## 2021-01-24 DIAGNOSIS — N183 Chronic kidney disease, stage 3 unspecified: Secondary | ICD-10-CM | POA: Diagnosis not present

## 2021-01-24 DIAGNOSIS — Z6825 Body mass index (BMI) 25.0-25.9, adult: Secondary | ICD-10-CM | POA: Diagnosis not present

## 2021-01-24 DIAGNOSIS — J189 Pneumonia, unspecified organism: Secondary | ICD-10-CM | POA: Diagnosis not present

## 2021-01-24 DIAGNOSIS — I10 Essential (primary) hypertension: Secondary | ICD-10-CM | POA: Diagnosis not present

## 2021-01-24 DIAGNOSIS — I2510 Atherosclerotic heart disease of native coronary artery without angina pectoris: Secondary | ICD-10-CM | POA: Diagnosis not present

## 2021-01-29 DIAGNOSIS — I130 Hypertensive heart and chronic kidney disease with heart failure and stage 1 through stage 4 chronic kidney disease, or unspecified chronic kidney disease: Secondary | ICD-10-CM | POA: Diagnosis not present

## 2021-01-29 DIAGNOSIS — I252 Old myocardial infarction: Secondary | ICD-10-CM | POA: Diagnosis not present

## 2021-01-29 DIAGNOSIS — E1122 Type 2 diabetes mellitus with diabetic chronic kidney disease: Secondary | ICD-10-CM | POA: Diagnosis not present

## 2021-01-29 DIAGNOSIS — N183 Chronic kidney disease, stage 3 unspecified: Secondary | ICD-10-CM | POA: Diagnosis not present

## 2021-01-29 DIAGNOSIS — I2510 Atherosclerotic heart disease of native coronary artery without angina pectoris: Secondary | ICD-10-CM | POA: Diagnosis not present

## 2021-01-29 DIAGNOSIS — J189 Pneumonia, unspecified organism: Secondary | ICD-10-CM | POA: Diagnosis not present

## 2021-01-30 DIAGNOSIS — M170 Bilateral primary osteoarthritis of knee: Secondary | ICD-10-CM | POA: Diagnosis not present

## 2021-01-30 DIAGNOSIS — I739 Peripheral vascular disease, unspecified: Secondary | ICD-10-CM | POA: Diagnosis not present

## 2021-01-30 DIAGNOSIS — I10 Essential (primary) hypertension: Secondary | ICD-10-CM | POA: Diagnosis not present

## 2021-01-30 DIAGNOSIS — I2510 Atherosclerotic heart disease of native coronary artery without angina pectoris: Secondary | ICD-10-CM | POA: Diagnosis not present

## 2021-01-30 DIAGNOSIS — E039 Hypothyroidism, unspecified: Secondary | ICD-10-CM | POA: Diagnosis not present

## 2021-01-30 DIAGNOSIS — I509 Heart failure, unspecified: Secondary | ICD-10-CM | POA: Diagnosis not present

## 2021-01-30 DIAGNOSIS — J449 Chronic obstructive pulmonary disease, unspecified: Secondary | ICD-10-CM | POA: Diagnosis not present

## 2021-01-30 DIAGNOSIS — J439 Emphysema, unspecified: Secondary | ICD-10-CM | POA: Diagnosis not present

## 2021-01-30 DIAGNOSIS — N184 Chronic kidney disease, stage 4 (severe): Secondary | ICD-10-CM | POA: Diagnosis not present

## 2021-01-30 DIAGNOSIS — C679 Malignant neoplasm of bladder, unspecified: Secondary | ICD-10-CM | POA: Diagnosis not present

## 2021-01-30 DIAGNOSIS — E1151 Type 2 diabetes mellitus with diabetic peripheral angiopathy without gangrene: Secondary | ICD-10-CM | POA: Diagnosis not present

## 2021-01-30 DIAGNOSIS — J984 Other disorders of lung: Secondary | ICD-10-CM | POA: Diagnosis not present

## 2021-01-30 DIAGNOSIS — D692 Other nonthrombocytopenic purpura: Secondary | ICD-10-CM | POA: Diagnosis not present

## 2021-02-01 DIAGNOSIS — I252 Old myocardial infarction: Secondary | ICD-10-CM | POA: Diagnosis not present

## 2021-02-01 DIAGNOSIS — I130 Hypertensive heart and chronic kidney disease with heart failure and stage 1 through stage 4 chronic kidney disease, or unspecified chronic kidney disease: Secondary | ICD-10-CM | POA: Diagnosis not present

## 2021-02-01 DIAGNOSIS — E1122 Type 2 diabetes mellitus with diabetic chronic kidney disease: Secondary | ICD-10-CM | POA: Diagnosis not present

## 2021-02-01 DIAGNOSIS — J189 Pneumonia, unspecified organism: Secondary | ICD-10-CM | POA: Diagnosis not present

## 2021-02-01 DIAGNOSIS — N183 Chronic kidney disease, stage 3 unspecified: Secondary | ICD-10-CM | POA: Diagnosis not present

## 2021-02-01 DIAGNOSIS — I2510 Atherosclerotic heart disease of native coronary artery without angina pectoris: Secondary | ICD-10-CM | POA: Diagnosis not present

## 2021-02-06 DIAGNOSIS — I2510 Atherosclerotic heart disease of native coronary artery without angina pectoris: Secondary | ICD-10-CM | POA: Diagnosis not present

## 2021-02-06 DIAGNOSIS — N183 Chronic kidney disease, stage 3 unspecified: Secondary | ICD-10-CM | POA: Diagnosis not present

## 2021-02-06 DIAGNOSIS — I252 Old myocardial infarction: Secondary | ICD-10-CM | POA: Diagnosis not present

## 2021-02-06 DIAGNOSIS — E1122 Type 2 diabetes mellitus with diabetic chronic kidney disease: Secondary | ICD-10-CM | POA: Diagnosis not present

## 2021-02-06 DIAGNOSIS — J189 Pneumonia, unspecified organism: Secondary | ICD-10-CM | POA: Diagnosis not present

## 2021-02-06 DIAGNOSIS — I130 Hypertensive heart and chronic kidney disease with heart failure and stage 1 through stage 4 chronic kidney disease, or unspecified chronic kidney disease: Secondary | ICD-10-CM | POA: Diagnosis not present

## 2021-02-12 DIAGNOSIS — J189 Pneumonia, unspecified organism: Secondary | ICD-10-CM | POA: Diagnosis not present

## 2021-02-12 DIAGNOSIS — N183 Chronic kidney disease, stage 3 unspecified: Secondary | ICD-10-CM | POA: Diagnosis not present

## 2021-02-12 DIAGNOSIS — I2510 Atherosclerotic heart disease of native coronary artery without angina pectoris: Secondary | ICD-10-CM | POA: Diagnosis not present

## 2021-02-12 DIAGNOSIS — I252 Old myocardial infarction: Secondary | ICD-10-CM | POA: Diagnosis not present

## 2021-02-12 DIAGNOSIS — I130 Hypertensive heart and chronic kidney disease with heart failure and stage 1 through stage 4 chronic kidney disease, or unspecified chronic kidney disease: Secondary | ICD-10-CM | POA: Diagnosis not present

## 2021-02-12 DIAGNOSIS — E1122 Type 2 diabetes mellitus with diabetic chronic kidney disease: Secondary | ICD-10-CM | POA: Diagnosis not present

## 2021-02-13 DIAGNOSIS — N183 Chronic kidney disease, stage 3 unspecified: Secondary | ICD-10-CM | POA: Diagnosis not present

## 2021-02-13 DIAGNOSIS — I130 Hypertensive heart and chronic kidney disease with heart failure and stage 1 through stage 4 chronic kidney disease, or unspecified chronic kidney disease: Secondary | ICD-10-CM | POA: Diagnosis not present

## 2021-02-13 DIAGNOSIS — I2510 Atherosclerotic heart disease of native coronary artery without angina pectoris: Secondary | ICD-10-CM | POA: Diagnosis not present

## 2021-02-13 DIAGNOSIS — J189 Pneumonia, unspecified organism: Secondary | ICD-10-CM | POA: Diagnosis not present

## 2021-02-13 DIAGNOSIS — E1122 Type 2 diabetes mellitus with diabetic chronic kidney disease: Secondary | ICD-10-CM | POA: Diagnosis not present

## 2021-02-13 DIAGNOSIS — I252 Old myocardial infarction: Secondary | ICD-10-CM | POA: Diagnosis not present

## 2021-02-18 DIAGNOSIS — I252 Old myocardial infarction: Secondary | ICD-10-CM | POA: Diagnosis not present

## 2021-02-18 DIAGNOSIS — J189 Pneumonia, unspecified organism: Secondary | ICD-10-CM | POA: Diagnosis not present

## 2021-02-18 DIAGNOSIS — E1122 Type 2 diabetes mellitus with diabetic chronic kidney disease: Secondary | ICD-10-CM | POA: Diagnosis not present

## 2021-02-18 DIAGNOSIS — I130 Hypertensive heart and chronic kidney disease with heart failure and stage 1 through stage 4 chronic kidney disease, or unspecified chronic kidney disease: Secondary | ICD-10-CM | POA: Diagnosis not present

## 2021-02-18 DIAGNOSIS — N183 Chronic kidney disease, stage 3 unspecified: Secondary | ICD-10-CM | POA: Diagnosis not present

## 2021-02-18 DIAGNOSIS — I2510 Atherosclerotic heart disease of native coronary artery without angina pectoris: Secondary | ICD-10-CM | POA: Diagnosis not present

## 2021-02-20 DIAGNOSIS — Z951 Presence of aortocoronary bypass graft: Secondary | ICD-10-CM | POA: Diagnosis not present

## 2021-02-20 DIAGNOSIS — E785 Hyperlipidemia, unspecified: Secondary | ICD-10-CM | POA: Diagnosis not present

## 2021-02-20 DIAGNOSIS — I5022 Chronic systolic (congestive) heart failure: Secondary | ICD-10-CM | POA: Diagnosis not present

## 2021-02-20 DIAGNOSIS — Z7984 Long term (current) use of oral hypoglycemic drugs: Secondary | ICD-10-CM | POA: Diagnosis not present

## 2021-02-20 DIAGNOSIS — M1990 Unspecified osteoarthritis, unspecified site: Secondary | ICD-10-CM | POA: Diagnosis not present

## 2021-02-20 DIAGNOSIS — I252 Old myocardial infarction: Secondary | ICD-10-CM | POA: Diagnosis not present

## 2021-02-20 DIAGNOSIS — J440 Chronic obstructive pulmonary disease with acute lower respiratory infection: Secondary | ICD-10-CM | POA: Diagnosis not present

## 2021-02-20 DIAGNOSIS — N183 Chronic kidney disease, stage 3 unspecified: Secondary | ICD-10-CM | POA: Diagnosis not present

## 2021-02-20 DIAGNOSIS — Z8551 Personal history of malignant neoplasm of bladder: Secondary | ICD-10-CM | POA: Diagnosis not present

## 2021-02-20 DIAGNOSIS — N179 Acute kidney failure, unspecified: Secondary | ICD-10-CM | POA: Diagnosis not present

## 2021-02-20 DIAGNOSIS — Z7901 Long term (current) use of anticoagulants: Secondary | ICD-10-CM | POA: Diagnosis not present

## 2021-02-20 DIAGNOSIS — J189 Pneumonia, unspecified organism: Secondary | ICD-10-CM | POA: Diagnosis not present

## 2021-02-20 DIAGNOSIS — E1122 Type 2 diabetes mellitus with diabetic chronic kidney disease: Secondary | ICD-10-CM | POA: Diagnosis not present

## 2021-02-20 DIAGNOSIS — I2510 Atherosclerotic heart disease of native coronary artery without angina pectoris: Secondary | ICD-10-CM | POA: Diagnosis not present

## 2021-02-20 DIAGNOSIS — E039 Hypothyroidism, unspecified: Secondary | ICD-10-CM | POA: Diagnosis not present

## 2021-02-20 DIAGNOSIS — J441 Chronic obstructive pulmonary disease with (acute) exacerbation: Secondary | ICD-10-CM | POA: Diagnosis not present

## 2021-02-20 DIAGNOSIS — Z87891 Personal history of nicotine dependence: Secondary | ICD-10-CM | POA: Diagnosis not present

## 2021-02-20 DIAGNOSIS — I130 Hypertensive heart and chronic kidney disease with heart failure and stage 1 through stage 4 chronic kidney disease, or unspecified chronic kidney disease: Secondary | ICD-10-CM | POA: Diagnosis not present

## 2021-02-20 DIAGNOSIS — K219 Gastro-esophageal reflux disease without esophagitis: Secondary | ICD-10-CM | POA: Diagnosis not present

## 2021-02-20 DIAGNOSIS — M069 Rheumatoid arthritis, unspecified: Secondary | ICD-10-CM | POA: Diagnosis not present

## 2021-02-20 DIAGNOSIS — L03116 Cellulitis of left lower limb: Secondary | ICD-10-CM | POA: Diagnosis not present

## 2021-02-20 DIAGNOSIS — I480 Paroxysmal atrial fibrillation: Secondary | ICD-10-CM | POA: Diagnosis not present

## 2021-02-20 DIAGNOSIS — Z9981 Dependence on supplemental oxygen: Secondary | ICD-10-CM | POA: Diagnosis not present

## 2021-02-20 DIAGNOSIS — M109 Gout, unspecified: Secondary | ICD-10-CM | POA: Diagnosis not present

## 2021-02-20 DIAGNOSIS — Z7951 Long term (current) use of inhaled steroids: Secondary | ICD-10-CM | POA: Diagnosis not present

## 2021-02-21 ENCOUNTER — Ambulatory Visit: Payer: Medicare Other | Admitting: Cardiology

## 2021-02-27 DIAGNOSIS — N183 Chronic kidney disease, stage 3 unspecified: Secondary | ICD-10-CM | POA: Diagnosis not present

## 2021-02-27 DIAGNOSIS — I130 Hypertensive heart and chronic kidney disease with heart failure and stage 1 through stage 4 chronic kidney disease, or unspecified chronic kidney disease: Secondary | ICD-10-CM | POA: Diagnosis not present

## 2021-02-27 DIAGNOSIS — I2510 Atherosclerotic heart disease of native coronary artery without angina pectoris: Secondary | ICD-10-CM | POA: Diagnosis not present

## 2021-02-27 DIAGNOSIS — J189 Pneumonia, unspecified organism: Secondary | ICD-10-CM | POA: Diagnosis not present

## 2021-02-27 DIAGNOSIS — E1122 Type 2 diabetes mellitus with diabetic chronic kidney disease: Secondary | ICD-10-CM | POA: Diagnosis not present

## 2021-02-27 DIAGNOSIS — I252 Old myocardial infarction: Secondary | ICD-10-CM | POA: Diagnosis not present

## 2021-03-05 DIAGNOSIS — N183 Chronic kidney disease, stage 3 unspecified: Secondary | ICD-10-CM | POA: Diagnosis not present

## 2021-03-05 DIAGNOSIS — I252 Old myocardial infarction: Secondary | ICD-10-CM | POA: Diagnosis not present

## 2021-03-05 DIAGNOSIS — I130 Hypertensive heart and chronic kidney disease with heart failure and stage 1 through stage 4 chronic kidney disease, or unspecified chronic kidney disease: Secondary | ICD-10-CM | POA: Diagnosis not present

## 2021-03-05 DIAGNOSIS — I2510 Atherosclerotic heart disease of native coronary artery without angina pectoris: Secondary | ICD-10-CM | POA: Diagnosis not present

## 2021-03-05 DIAGNOSIS — J189 Pneumonia, unspecified organism: Secondary | ICD-10-CM | POA: Diagnosis not present

## 2021-03-05 DIAGNOSIS — E1122 Type 2 diabetes mellitus with diabetic chronic kidney disease: Secondary | ICD-10-CM | POA: Diagnosis not present

## 2021-03-06 DIAGNOSIS — I252 Old myocardial infarction: Secondary | ICD-10-CM | POA: Diagnosis not present

## 2021-03-06 DIAGNOSIS — J189 Pneumonia, unspecified organism: Secondary | ICD-10-CM | POA: Diagnosis not present

## 2021-03-06 DIAGNOSIS — N183 Chronic kidney disease, stage 3 unspecified: Secondary | ICD-10-CM | POA: Diagnosis not present

## 2021-03-06 DIAGNOSIS — I130 Hypertensive heart and chronic kidney disease with heart failure and stage 1 through stage 4 chronic kidney disease, or unspecified chronic kidney disease: Secondary | ICD-10-CM | POA: Diagnosis not present

## 2021-03-06 DIAGNOSIS — I2510 Atherosclerotic heart disease of native coronary artery without angina pectoris: Secondary | ICD-10-CM | POA: Diagnosis not present

## 2021-03-06 DIAGNOSIS — E1122 Type 2 diabetes mellitus with diabetic chronic kidney disease: Secondary | ICD-10-CM | POA: Diagnosis not present

## 2021-03-11 DIAGNOSIS — I130 Hypertensive heart and chronic kidney disease with heart failure and stage 1 through stage 4 chronic kidney disease, or unspecified chronic kidney disease: Secondary | ICD-10-CM | POA: Diagnosis not present

## 2021-03-11 DIAGNOSIS — N183 Chronic kidney disease, stage 3 unspecified: Secondary | ICD-10-CM | POA: Diagnosis not present

## 2021-03-11 DIAGNOSIS — E1122 Type 2 diabetes mellitus with diabetic chronic kidney disease: Secondary | ICD-10-CM | POA: Diagnosis not present

## 2021-03-11 DIAGNOSIS — I252 Old myocardial infarction: Secondary | ICD-10-CM | POA: Diagnosis not present

## 2021-03-11 DIAGNOSIS — J189 Pneumonia, unspecified organism: Secondary | ICD-10-CM | POA: Diagnosis not present

## 2021-03-11 DIAGNOSIS — I2510 Atherosclerotic heart disease of native coronary artery without angina pectoris: Secondary | ICD-10-CM | POA: Diagnosis not present

## 2021-03-19 DIAGNOSIS — I130 Hypertensive heart and chronic kidney disease with heart failure and stage 1 through stage 4 chronic kidney disease, or unspecified chronic kidney disease: Secondary | ICD-10-CM | POA: Diagnosis not present

## 2021-03-19 DIAGNOSIS — I252 Old myocardial infarction: Secondary | ICD-10-CM | POA: Diagnosis not present

## 2021-03-19 DIAGNOSIS — I2510 Atherosclerotic heart disease of native coronary artery without angina pectoris: Secondary | ICD-10-CM | POA: Diagnosis not present

## 2021-03-19 DIAGNOSIS — E1122 Type 2 diabetes mellitus with diabetic chronic kidney disease: Secondary | ICD-10-CM | POA: Diagnosis not present

## 2021-03-19 DIAGNOSIS — N183 Chronic kidney disease, stage 3 unspecified: Secondary | ICD-10-CM | POA: Diagnosis not present

## 2021-03-19 DIAGNOSIS — J189 Pneumonia, unspecified organism: Secondary | ICD-10-CM | POA: Diagnosis not present

## 2021-03-20 DIAGNOSIS — N183 Chronic kidney disease, stage 3 unspecified: Secondary | ICD-10-CM | POA: Diagnosis not present

## 2021-03-20 DIAGNOSIS — I252 Old myocardial infarction: Secondary | ICD-10-CM | POA: Diagnosis not present

## 2021-03-20 DIAGNOSIS — I2510 Atherosclerotic heart disease of native coronary artery without angina pectoris: Secondary | ICD-10-CM | POA: Diagnosis not present

## 2021-03-20 DIAGNOSIS — I130 Hypertensive heart and chronic kidney disease with heart failure and stage 1 through stage 4 chronic kidney disease, or unspecified chronic kidney disease: Secondary | ICD-10-CM | POA: Diagnosis not present

## 2021-03-20 DIAGNOSIS — J189 Pneumonia, unspecified organism: Secondary | ICD-10-CM | POA: Diagnosis not present

## 2021-03-20 DIAGNOSIS — E1122 Type 2 diabetes mellitus with diabetic chronic kidney disease: Secondary | ICD-10-CM | POA: Diagnosis not present

## 2021-05-06 DIAGNOSIS — C44722 Squamous cell carcinoma of skin of right lower limb, including hip: Secondary | ICD-10-CM | POA: Diagnosis not present

## 2021-05-06 DIAGNOSIS — D0439 Carcinoma in situ of skin of other parts of face: Secondary | ICD-10-CM | POA: Diagnosis not present

## 2021-05-06 DIAGNOSIS — L570 Actinic keratosis: Secondary | ICD-10-CM | POA: Diagnosis not present

## 2021-05-06 DIAGNOSIS — C44329 Squamous cell carcinoma of skin of other parts of face: Secondary | ICD-10-CM | POA: Diagnosis not present

## 2021-05-07 DIAGNOSIS — D692 Other nonthrombocytopenic purpura: Secondary | ICD-10-CM | POA: Diagnosis not present

## 2021-05-07 DIAGNOSIS — N184 Chronic kidney disease, stage 4 (severe): Secondary | ICD-10-CM | POA: Diagnosis not present

## 2021-05-07 DIAGNOSIS — E039 Hypothyroidism, unspecified: Secondary | ICD-10-CM | POA: Diagnosis not present

## 2021-05-07 DIAGNOSIS — J439 Emphysema, unspecified: Secondary | ICD-10-CM | POA: Diagnosis not present

## 2021-05-07 DIAGNOSIS — Z9181 History of falling: Secondary | ICD-10-CM | POA: Diagnosis not present

## 2021-05-07 DIAGNOSIS — I509 Heart failure, unspecified: Secondary | ICD-10-CM | POA: Diagnosis not present

## 2021-05-07 DIAGNOSIS — J984 Other disorders of lung: Secondary | ICD-10-CM | POA: Diagnosis not present

## 2021-05-07 DIAGNOSIS — E1151 Type 2 diabetes mellitus with diabetic peripheral angiopathy without gangrene: Secondary | ICD-10-CM | POA: Diagnosis not present

## 2021-05-07 DIAGNOSIS — I10 Essential (primary) hypertension: Secondary | ICD-10-CM | POA: Diagnosis not present

## 2021-05-07 DIAGNOSIS — Z139 Encounter for screening, unspecified: Secondary | ICD-10-CM | POA: Diagnosis not present

## 2021-05-07 DIAGNOSIS — C679 Malignant neoplasm of bladder, unspecified: Secondary | ICD-10-CM | POA: Diagnosis not present

## 2021-05-07 DIAGNOSIS — Z1331 Encounter for screening for depression: Secondary | ICD-10-CM | POA: Diagnosis not present

## 2021-05-07 DIAGNOSIS — J449 Chronic obstructive pulmonary disease, unspecified: Secondary | ICD-10-CM | POA: Diagnosis not present

## 2021-05-22 ENCOUNTER — Other Ambulatory Visit: Payer: Self-pay | Admitting: Cardiology

## 2021-05-22 DIAGNOSIS — Z1331 Encounter for screening for depression: Secondary | ICD-10-CM | POA: Diagnosis not present

## 2021-05-22 DIAGNOSIS — Z0000 Encounter for general adult medical examination without abnormal findings: Secondary | ICD-10-CM | POA: Diagnosis not present

## 2021-05-22 DIAGNOSIS — Z9181 History of falling: Secondary | ICD-10-CM | POA: Diagnosis not present

## 2021-05-22 DIAGNOSIS — E785 Hyperlipidemia, unspecified: Secondary | ICD-10-CM | POA: Diagnosis not present

## 2021-05-23 NOTE — Telephone Encounter (Signed)
46m, 75.8kg, lovw/krasowski 04/05/20, Creatinine, Serum 1.630 mg/ 05/07/2021

## 2021-05-28 ENCOUNTER — Other Ambulatory Visit: Payer: Self-pay | Admitting: Cardiology

## 2021-05-29 DIAGNOSIS — K219 Gastro-esophageal reflux disease without esophagitis: Secondary | ICD-10-CM | POA: Insufficient documentation

## 2021-05-29 DIAGNOSIS — J309 Allergic rhinitis, unspecified: Secondary | ICD-10-CM | POA: Insufficient documentation

## 2021-05-29 DIAGNOSIS — N189 Chronic kidney disease, unspecified: Secondary | ICD-10-CM

## 2021-05-29 DIAGNOSIS — M1990 Unspecified osteoarthritis, unspecified site: Secondary | ICD-10-CM | POA: Insufficient documentation

## 2021-05-29 DIAGNOSIS — R609 Edema, unspecified: Secondary | ICD-10-CM

## 2021-05-29 HISTORY — DX: Edema, unspecified: R60.9

## 2021-05-29 HISTORY — DX: Chronic kidney disease, unspecified: N18.9

## 2021-05-30 ENCOUNTER — Other Ambulatory Visit: Payer: Self-pay

## 2021-05-30 ENCOUNTER — Ambulatory Visit (INDEPENDENT_AMBULATORY_CARE_PROVIDER_SITE_OTHER): Payer: Medicare Other | Admitting: Cardiology

## 2021-05-30 ENCOUNTER — Encounter: Payer: Self-pay | Admitting: Cardiology

## 2021-05-30 VITALS — BP 102/64 | HR 93 | Ht 66.0 in | Wt 163.4 lb

## 2021-05-30 DIAGNOSIS — C679 Malignant neoplasm of bladder, unspecified: Secondary | ICD-10-CM | POA: Diagnosis not present

## 2021-05-30 DIAGNOSIS — I255 Ischemic cardiomyopathy: Secondary | ICD-10-CM

## 2021-05-30 DIAGNOSIS — Z951 Presence of aortocoronary bypass graft: Secondary | ICD-10-CM

## 2021-05-30 DIAGNOSIS — I2510 Atherosclerotic heart disease of native coronary artery without angina pectoris: Secondary | ICD-10-CM

## 2021-05-30 NOTE — Patient Instructions (Signed)
Medication Instructions:  Your physician recommends that you continue on your current medications as directed. Please refer to the Current Medication list given to you today.  *If you need a refill on your cardiac medications before your next appointment, please call your pharmacy*   Lab Work: None If you have labs (blood work) drawn today and your tests are completely normal, you will receive your results only by: MyChart Message (if you have MyChart) OR A paper copy in the mail If you have any lab test that is abnormal or we need to change your treatment, we will call you to review the results.   Testing/Procedures: Your physician has requested that you have an echocardiogram. Echocardiography is a painless test that uses sound waves to create images of your heart. It provides your doctor with information about the size and shape of your heart and how well your heart's chambers and valves are working. This procedure takes approximately one hour. There are no restrictions for this procedure.    Follow-Up: At [facility], you and your health needs are our priority.  As part of our continuing mission to provide you with exceptional heart care, we have created designated Provider Care Teams.  These Care Teams include your primary Cardiologist (physician) and Advanced Practice Providers (APPs -  Physician Assistants and Nurse Practitioners) who all work together to provide you with the care you need, when you need it.  We recommend signing up for the patient portal called "MyChart".  Sign up information is provided on this After Visit Summary.  MyChart is used to connect with patients for Virtual Visits (Telemedicine).  Patients are able to view lab/test results, encounter notes, upcoming appointments, etc.  Non-urgent messages can be sent to your provider as well.   To learn more about what you can do with MyChart, go to [email or web address].    Your next appointment:   6  month(s)  The format for your next appointment:   In Person  Provider:   Robert Krasowski, MD   Other Instructions   

## 2021-05-30 NOTE — Progress Notes (Signed)
Cardiology Office Note:    Date:  05/30/2021   ID:  TERANCE POMPLUN, DOB Jun 25, 1931, MRN [id number]  PCP:  Helen Hashimoto., MD  Cardiologist:  Jenne Campus, MD    Referring MD: Helen Hashimoto., MD   No chief complaint on file. Doing well  History of Present Illness:    Brad [name] is a 87 y.o. male with past medical history significant for coronary artery disease, status post coronary bypass graft years ago, type 2 diabetes, essential hypertension, bladder cancer.  She is coming today to my office for follow-up.  He does have 2 complaints which are most likely not cardiac related 1 is the fact that he gets tingling and numbness in his hands.  It looks like he may be having carpal tunnel syndrome.  Another complaint which is a chronic problem with his back issue he did have a back fracture before suffer LOC because of this he is to wear some support for it but does not like and does not want to wear it.  Luckily from cardiac standpoint reviewed looks like she is doing fine interestingly still cooking and he is able to stand while cooking.  Past Medical History:  Diagnosis Date   Allergic rhinitis    Bladder cancer ([place]) 10/11/2018   Chronic airflow obstruction ([facility])    CKD (chronic kidney disease) 05/29/2021   Closed compression fracture of thoracic vertebra ([place]) 03/10/2017   Coronary atherosclerosis    Diabetes mellitus ([place]) 11/11/2016   DJD (degenerative joint disease)    DM (diabetes mellitus) type II controlled peripheral vascular disorder    DOE (dyspnea on exertion) 11/11/2016   Edema 05/29/2021   GERD (gastroesophageal reflux disease)    HLD (hyperlipidemia) 12/31/2016   Hx of CABG 12/08/2017   Hyperlipidemia    Hypertension    Osteoarthritis    Renal insufficiency    Rib fracture 12/31/2016    Past Surgical History:  Procedure Laterality Date   Cosmetic surgery on the R eye Right 08/2020   Approx date    Current Medications: No outpatient medications  have been marked as taking for the 05/30/21 encounter (Appointment) with Park Liter, MD.     Allergies:   Fentanyl, Morphine, Atorvastatin, Penicillins, and Prednisone   Social History   Socioeconomic History   Marital status: Married    Spouse name: Not on file   Number of children: Not on file   Years of education: Not on file   Highest education level: Not on file  Occupational History   Occupation: retired  Tobacco Use   Smoking status: Former    Pack years: 0.00    Types: Cigarettes   Smokeless tobacco: Never  Vaping Use   Vaping Use: Never used  Substance and Sexual Activity   Alcohol use: Yes    Comment: occasionally   Drug use: No   Sexual activity: Not on file  Other Topics Concern   Not on file  Social History Narrative   Not on file   Social Determinants of Health   Financial Resource Strain: Not on file  Food Insecurity: Not on file  Transportation Needs: Not on file  Physical Activity: Not on file  Stress: Not on file  Social Connections: Not on file     Family History: The patient's family history is not on file. ROS:   Please see the history of present illness.    All 14 point review of systems negative except as described per history of  present illness  EKGs/Labs/Other Studies Reviewed:      Recent Labs: No results found for requested labs within last 8760 hours.  Recent Lipid Panel No results found for: CHOL, TRIG, HDL, CHOLHDL, VLDL, LDLCALC, LDLDIRECT  Physical Exam:    VS:  There were no vitals taken for this visit.    Wt Readings from Last 3 Encounters:  04/05/20 167 lb (75.8 kg)  09/20/19 166 lb (75.3 kg)  05/16/19 166 lb (75.3 kg)     GEN:  Well nourished, well developed in no acute distress HEENT: Normal NECK: No JVD; No carotid bruits LYMPHATICS: No lymphadenopathy CARDIAC: RRR, no murmurs, no rubs, no gallops RESPIRATORY:  Clear to auscultation without rales, wheezing or rhonchi  ABDOMEN: Soft, non-tender,  non-distended MUSCULOSKELETAL:  No edema; No deformity  SKIN: Warm and dry LOWER EXTREMITIES: no swelling NEUROLOGIC:  Alert and oriented x 3 PSYCHIATRIC:  Normal affect   ASSESSMENT:    1. Ischemic cardiomyopathy   2. Coronary artery disease involving native coronary artery of native heart without angina pectoris   3. Hx of CABG   4. Malignant neoplasm of urinary bladder, unspecified site [facility])    PLAN:    In order of problems listed above:  Ischemic cardiomyopathy.  I will ask him to have another echocardiogram done to reassess left ventricular ejection fraction. Coronary artery disease: Seems to be stable from that point review History of CABG stable , Urinary bladder cancer.  Stable Overall after admit considering all his comorbidities he is doing quite well he is clearly very fragile and we can see that he is 87 years old but considering all his problems he is holding quite well.  I will call his primary care physician to get his last fasting lipid profile   Medication Adjustments/Labs and Tests Ordered: Current medicines are reviewed at length with the patient today.  Concerns regarding medicines are outlined above.  No orders of the defined types were placed in this encounter.  Medication changes: No orders of the defined types were placed in this encounter.   Signed, Park Liter, MD, [facility] 05/30/2021 3:39 PM    [place]

## 2021-06-11 DIAGNOSIS — C44329 Squamous cell carcinoma of skin of other parts of face: Secondary | ICD-10-CM | POA: Diagnosis not present

## 2021-06-11 DIAGNOSIS — L570 Actinic keratosis: Secondary | ICD-10-CM | POA: Diagnosis not present

## 2021-06-11 DIAGNOSIS — C44722 Squamous cell carcinoma of skin of right lower limb, including hip: Secondary | ICD-10-CM | POA: Diagnosis not present

## 2021-06-19 ENCOUNTER — Other Ambulatory Visit: Payer: Self-pay

## 2021-06-19 ENCOUNTER — Ambulatory Visit (INDEPENDENT_AMBULATORY_CARE_PROVIDER_SITE_OTHER): Payer: Medicare Other

## 2021-06-19 DIAGNOSIS — I255 Ischemic cardiomyopathy: Secondary | ICD-10-CM | POA: Diagnosis not present

## 2021-06-19 DIAGNOSIS — C679 Malignant neoplasm of bladder, unspecified: Secondary | ICD-10-CM | POA: Diagnosis not present

## 2021-06-19 DIAGNOSIS — Z951 Presence of aortocoronary bypass graft: Secondary | ICD-10-CM

## 2021-06-19 DIAGNOSIS — I2510 Atherosclerotic heart disease of native coronary artery without angina pectoris: Secondary | ICD-10-CM

## 2021-06-19 LAB — ECHOCARDIOGRAM COMPLETE
Area-P 1/2: 5.88 cm2
Calc EF: 35.8 %
S' Lateral: 3.3 cm
Single Plane A2C EF: 33.4 %
Single Plane A4C EF: 37.2 %

## 2021-06-19 NOTE — Progress Notes (Signed)
Complete echocardiogram performed.  Jimmy Deshawn Witty RDCS, RVT  

## 2021-06-24 ENCOUNTER — Telehealth: Payer: Self-pay

## 2021-06-24 NOTE — Telephone Encounter (Signed)
Spoke with patient regarding results and recommendation.  Patient verbalizes understanding and is agreeable to plan of care. Advised patient to call back with any issues or concerns.  

## 2021-06-24 NOTE — Telephone Encounter (Signed)
-----   Message from Park Liter, MD sent at 06/24/2021  9:11 AM EDT ----- Echocardiogram showing normal left ventricle ejection fraction, pulmonary hypertension which is known, biatrial enlargement, mild mitral valve regurgitation overall no significant change

## 2021-07-04 DIAGNOSIS — Z87891 Personal history of nicotine dependence: Secondary | ICD-10-CM | POA: Diagnosis not present

## 2021-07-04 DIAGNOSIS — I130 Hypertensive heart and chronic kidney disease with heart failure and stage 1 through stage 4 chronic kidney disease, or unspecified chronic kidney disease: Secondary | ICD-10-CM | POA: Diagnosis not present

## 2021-07-04 DIAGNOSIS — R5381 Other malaise: Secondary | ICD-10-CM | POA: Diagnosis present

## 2021-07-04 DIAGNOSIS — I361 Nonrheumatic tricuspid (valve) insufficiency: Secondary | ICD-10-CM | POA: Diagnosis not present

## 2021-07-04 DIAGNOSIS — R931 Abnormal findings on diagnostic imaging of heart and coronary circulation: Secondary | ICD-10-CM | POA: Diagnosis present

## 2021-07-04 DIAGNOSIS — Z9181 History of falling: Secondary | ICD-10-CM | POA: Diagnosis not present

## 2021-07-04 DIAGNOSIS — I472 Ventricular tachycardia: Secondary | ICD-10-CM | POA: Diagnosis not present

## 2021-07-04 DIAGNOSIS — I517 Cardiomegaly: Secondary | ICD-10-CM | POA: Diagnosis not present

## 2021-07-04 DIAGNOSIS — R4020 Unspecified coma: Secondary | ICD-10-CM | POA: Diagnosis not present

## 2021-07-04 DIAGNOSIS — I4892 Unspecified atrial flutter: Secondary | ICD-10-CM | POA: Diagnosis not present

## 2021-07-04 DIAGNOSIS — E1122 Type 2 diabetes mellitus with diabetic chronic kidney disease: Secondary | ICD-10-CM | POA: Diagnosis present

## 2021-07-04 DIAGNOSIS — I5022 Chronic systolic (congestive) heart failure: Secondary | ICD-10-CM | POA: Diagnosis not present

## 2021-07-04 DIAGNOSIS — E860 Dehydration: Secondary | ICD-10-CM | POA: Diagnosis not present

## 2021-07-04 DIAGNOSIS — J9811 Atelectasis: Secondary | ICD-10-CM | POA: Diagnosis not present

## 2021-07-04 DIAGNOSIS — I4811 Longstanding persistent atrial fibrillation: Secondary | ICD-10-CM | POA: Diagnosis present

## 2021-07-04 DIAGNOSIS — I499 Cardiac arrhythmia, unspecified: Secondary | ICD-10-CM | POA: Diagnosis not present

## 2021-07-04 DIAGNOSIS — K219 Gastro-esophageal reflux disease without esophagitis: Secondary | ICD-10-CM | POA: Diagnosis present

## 2021-07-04 DIAGNOSIS — Z951 Presence of aortocoronary bypass graft: Secondary | ICD-10-CM | POA: Diagnosis not present

## 2021-07-04 DIAGNOSIS — M1990 Unspecified osteoarthritis, unspecified site: Secondary | ICD-10-CM | POA: Diagnosis present

## 2021-07-04 DIAGNOSIS — R4182 Altered mental status, unspecified: Secondary | ICD-10-CM | POA: Diagnosis not present

## 2021-07-04 DIAGNOSIS — R0989 Other specified symptoms and signs involving the circulatory and respiratory systems: Secondary | ICD-10-CM | POA: Diagnosis present

## 2021-07-04 DIAGNOSIS — J690 Pneumonitis due to inhalation of food and vomit: Secondary | ICD-10-CM | POA: Diagnosis not present

## 2021-07-04 DIAGNOSIS — I429 Cardiomyopathy, unspecified: Secondary | ICD-10-CM | POA: Diagnosis present

## 2021-07-04 DIAGNOSIS — R262 Difficulty in walking, not elsewhere classified: Secondary | ICD-10-CM | POA: Diagnosis present

## 2021-07-04 DIAGNOSIS — R0902 Hypoxemia: Secondary | ICD-10-CM | POA: Diagnosis not present

## 2021-07-04 DIAGNOSIS — Z79899 Other long term (current) drug therapy: Secondary | ICD-10-CM | POA: Diagnosis not present

## 2021-07-04 DIAGNOSIS — I2510 Atherosclerotic heart disease of native coronary artery without angina pectoris: Secondary | ICD-10-CM | POA: Diagnosis present

## 2021-07-04 DIAGNOSIS — J9601 Acute respiratory failure with hypoxia: Secondary | ICD-10-CM | POA: Diagnosis not present

## 2021-07-04 DIAGNOSIS — I471 Supraventricular tachycardia: Secondary | ICD-10-CM | POA: Diagnosis not present

## 2021-07-04 DIAGNOSIS — I071 Rheumatic tricuspid insufficiency: Secondary | ICD-10-CM | POA: Diagnosis present

## 2021-07-04 DIAGNOSIS — I252 Old myocardial infarction: Secondary | ICD-10-CM | POA: Diagnosis not present

## 2021-07-04 DIAGNOSIS — N179 Acute kidney failure, unspecified: Secondary | ICD-10-CM | POA: Diagnosis present

## 2021-07-04 DIAGNOSIS — I480 Paroxysmal atrial fibrillation: Secondary | ICD-10-CM | POA: Diagnosis not present

## 2021-07-04 DIAGNOSIS — N183 Chronic kidney disease, stage 3 unspecified: Secondary | ICD-10-CM | POA: Diagnosis present

## 2021-07-04 DIAGNOSIS — J189 Pneumonia, unspecified organism: Secondary | ICD-10-CM | POA: Diagnosis not present

## 2021-07-04 DIAGNOSIS — E119 Type 2 diabetes mellitus without complications: Secondary | ICD-10-CM | POA: Diagnosis not present

## 2021-07-04 DIAGNOSIS — J439 Emphysema, unspecified: Secondary | ICD-10-CM | POA: Diagnosis not present

## 2021-07-04 DIAGNOSIS — R404 Transient alteration of awareness: Secondary | ICD-10-CM | POA: Diagnosis not present

## 2021-07-04 DIAGNOSIS — I493 Ventricular premature depolarization: Secondary | ICD-10-CM | POA: Diagnosis not present

## 2021-07-04 DIAGNOSIS — R918 Other nonspecific abnormal finding of lung field: Secondary | ICD-10-CM | POA: Diagnosis not present

## 2021-07-04 DIAGNOSIS — J440 Chronic obstructive pulmonary disease with acute lower respiratory infection: Secondary | ICD-10-CM | POA: Diagnosis not present

## 2021-07-05 DIAGNOSIS — I472 Ventricular tachycardia: Secondary | ICD-10-CM | POA: Diagnosis not present

## 2021-07-05 DIAGNOSIS — I480 Paroxysmal atrial fibrillation: Secondary | ICD-10-CM | POA: Diagnosis not present

## 2021-07-05 DIAGNOSIS — I2510 Atherosclerotic heart disease of native coronary artery without angina pectoris: Secondary | ICD-10-CM | POA: Diagnosis not present

## 2021-07-06 DIAGNOSIS — R0989 Other specified symptoms and signs involving the circulatory and respiratory systems: Secondary | ICD-10-CM

## 2021-07-06 DIAGNOSIS — I071 Rheumatic tricuspid insufficiency: Secondary | ICD-10-CM

## 2021-07-06 DIAGNOSIS — I480 Paroxysmal atrial fibrillation: Secondary | ICD-10-CM

## 2021-07-06 DIAGNOSIS — I361 Nonrheumatic tricuspid (valve) insufficiency: Secondary | ICD-10-CM

## 2021-07-06 DIAGNOSIS — I493 Ventricular premature depolarization: Secondary | ICD-10-CM

## 2021-07-06 DIAGNOSIS — I472 Ventricular tachycardia: Secondary | ICD-10-CM

## 2021-07-06 DIAGNOSIS — R931 Abnormal findings on diagnostic imaging of heart and coronary circulation: Secondary | ICD-10-CM

## 2021-07-07 DIAGNOSIS — R0989 Other specified symptoms and signs involving the circulatory and respiratory systems: Secondary | ICD-10-CM | POA: Diagnosis not present

## 2021-07-07 DIAGNOSIS — I493 Ventricular premature depolarization: Secondary | ICD-10-CM | POA: Diagnosis not present

## 2021-07-07 DIAGNOSIS — I472 Ventricular tachycardia: Secondary | ICD-10-CM | POA: Diagnosis not present

## 2021-07-07 DIAGNOSIS — I071 Rheumatic tricuspid insufficiency: Secondary | ICD-10-CM | POA: Diagnosis not present

## 2021-07-08 DIAGNOSIS — I472 Ventricular tachycardia: Secondary | ICD-10-CM | POA: Diagnosis not present

## 2021-07-08 DIAGNOSIS — I493 Ventricular premature depolarization: Secondary | ICD-10-CM | POA: Diagnosis not present

## 2021-07-08 DIAGNOSIS — R0989 Other specified symptoms and signs involving the circulatory and respiratory systems: Secondary | ICD-10-CM | POA: Diagnosis not present

## 2021-07-08 DIAGNOSIS — I071 Rheumatic tricuspid insufficiency: Secondary | ICD-10-CM | POA: Diagnosis not present

## 2021-07-09 DIAGNOSIS — I071 Rheumatic tricuspid insufficiency: Secondary | ICD-10-CM | POA: Diagnosis not present

## 2021-07-09 DIAGNOSIS — I493 Ventricular premature depolarization: Secondary | ICD-10-CM | POA: Diagnosis not present

## 2021-07-09 DIAGNOSIS — I472 Ventricular tachycardia: Secondary | ICD-10-CM | POA: Diagnosis not present

## 2021-07-09 DIAGNOSIS — R0989 Other specified symptoms and signs involving the circulatory and respiratory systems: Secondary | ICD-10-CM | POA: Diagnosis not present

## 2021-07-10 DIAGNOSIS — R0989 Other specified symptoms and signs involving the circulatory and respiratory systems: Secondary | ICD-10-CM | POA: Diagnosis not present

## 2021-07-10 DIAGNOSIS — I071 Rheumatic tricuspid insufficiency: Secondary | ICD-10-CM | POA: Diagnosis not present

## 2021-07-10 DIAGNOSIS — I472 Ventricular tachycardia: Secondary | ICD-10-CM | POA: Diagnosis not present

## 2021-07-10 DIAGNOSIS — I493 Ventricular premature depolarization: Secondary | ICD-10-CM | POA: Diagnosis not present

## 2021-07-11 DIAGNOSIS — I493 Ventricular premature depolarization: Secondary | ICD-10-CM | POA: Diagnosis not present

## 2021-07-11 DIAGNOSIS — I472 Ventricular tachycardia: Secondary | ICD-10-CM | POA: Diagnosis not present

## 2021-07-11 DIAGNOSIS — I071 Rheumatic tricuspid insufficiency: Secondary | ICD-10-CM | POA: Diagnosis not present

## 2021-07-11 DIAGNOSIS — R0989 Other specified symptoms and signs involving the circulatory and respiratory systems: Secondary | ICD-10-CM | POA: Diagnosis not present

## 2021-07-12 DIAGNOSIS — I472 Ventricular tachycardia: Secondary | ICD-10-CM | POA: Diagnosis not present

## 2021-07-12 DIAGNOSIS — I071 Rheumatic tricuspid insufficiency: Secondary | ICD-10-CM | POA: Diagnosis not present

## 2021-07-12 DIAGNOSIS — I493 Ventricular premature depolarization: Secondary | ICD-10-CM | POA: Diagnosis not present

## 2021-07-12 DIAGNOSIS — R0989 Other specified symptoms and signs involving the circulatory and respiratory systems: Secondary | ICD-10-CM | POA: Diagnosis not present

## 2021-07-13 DIAGNOSIS — I429 Cardiomyopathy, unspecified: Secondary | ICD-10-CM | POA: Diagnosis not present

## 2021-07-13 DIAGNOSIS — I472 Ventricular tachycardia: Secondary | ICD-10-CM

## 2021-07-13 DIAGNOSIS — I2510 Atherosclerotic heart disease of native coronary artery without angina pectoris: Secondary | ICD-10-CM

## 2021-07-13 DIAGNOSIS — I480 Paroxysmal atrial fibrillation: Secondary | ICD-10-CM

## 2021-07-13 DIAGNOSIS — Z79899 Other long term (current) drug therapy: Secondary | ICD-10-CM

## 2021-07-13 DIAGNOSIS — E119 Type 2 diabetes mellitus without complications: Secondary | ICD-10-CM

## 2021-07-14 DIAGNOSIS — I472 Ventricular tachycardia: Secondary | ICD-10-CM | POA: Diagnosis not present

## 2021-07-14 DIAGNOSIS — I480 Paroxysmal atrial fibrillation: Secondary | ICD-10-CM | POA: Diagnosis not present

## 2021-07-14 DIAGNOSIS — I429 Cardiomyopathy, unspecified: Secondary | ICD-10-CM | POA: Diagnosis not present

## 2021-07-14 DIAGNOSIS — I2510 Atherosclerotic heart disease of native coronary artery without angina pectoris: Secondary | ICD-10-CM | POA: Diagnosis not present

## 2021-07-15 DIAGNOSIS — I480 Paroxysmal atrial fibrillation: Secondary | ICD-10-CM | POA: Diagnosis not present

## 2021-07-15 DIAGNOSIS — I2510 Atherosclerotic heart disease of native coronary artery without angina pectoris: Secondary | ICD-10-CM | POA: Diagnosis not present

## 2021-07-15 DIAGNOSIS — I472 Ventricular tachycardia: Secondary | ICD-10-CM | POA: Diagnosis not present

## 2021-07-15 DIAGNOSIS — I429 Cardiomyopathy, unspecified: Secondary | ICD-10-CM | POA: Diagnosis not present

## 2021-07-17 ENCOUNTER — Other Ambulatory Visit: Payer: Self-pay | Admitting: Cardiology

## 2021-07-17 ENCOUNTER — Telehealth: Payer: Self-pay | Admitting: Cardiology

## 2021-07-17 NOTE — Telephone Encounter (Signed)
Pt c/o medication issue: 1. Name of [place]  2. How are you currently taking this medication (dosage and times per day)? BID  3. Are you having a reaction (difficulty breathing--STAT)?  No 4. What is your medication issue? Assistants     Patient calling the office for samples of medication:   1.  What medication and dosage are you requesting samples for? Entresto   2.  Are you currently out of this medication? No   Patient is out

## 2021-07-17 NOTE — Telephone Encounter (Signed)
Spoke with the patients wife just now and she let me know that the patient was given Entresto 24/26 mg BID. She tells me that she is needing samples of this medication. I advised unfortunately we do not have samples at this time. She is going to come to the office today to get patient assistance paperwork.    Encouraged patient to call back with any questions or concerns.

## 2021-07-18 DIAGNOSIS — J9601 Acute respiratory failure with hypoxia: Secondary | ICD-10-CM | POA: Diagnosis not present

## 2021-07-18 DIAGNOSIS — J42 Unspecified chronic bronchitis: Secondary | ICD-10-CM | POA: Diagnosis not present

## 2021-07-18 DIAGNOSIS — Z87891 Personal history of nicotine dependence: Secondary | ICD-10-CM | POA: Diagnosis not present

## 2021-07-18 DIAGNOSIS — M1990 Unspecified osteoarthritis, unspecified site: Secondary | ICD-10-CM | POA: Diagnosis not present

## 2021-07-18 DIAGNOSIS — I509 Heart failure, unspecified: Secondary | ICD-10-CM | POA: Diagnosis not present

## 2021-07-18 DIAGNOSIS — E039 Hypothyroidism, unspecified: Secondary | ICD-10-CM | POA: Diagnosis not present

## 2021-07-18 DIAGNOSIS — Z8551 Personal history of malignant neoplasm of bladder: Secondary | ICD-10-CM | POA: Diagnosis not present

## 2021-07-18 DIAGNOSIS — E1122 Type 2 diabetes mellitus with diabetic chronic kidney disease: Secondary | ICD-10-CM | POA: Diagnosis not present

## 2021-07-18 DIAGNOSIS — E7800 Pure hypercholesterolemia, unspecified: Secondary | ICD-10-CM | POA: Diagnosis not present

## 2021-07-18 DIAGNOSIS — I4811 Longstanding persistent atrial fibrillation: Secondary | ICD-10-CM | POA: Diagnosis not present

## 2021-07-18 DIAGNOSIS — Z7984 Long term (current) use of oral hypoglycemic drugs: Secondary | ICD-10-CM | POA: Diagnosis not present

## 2021-07-18 DIAGNOSIS — I252 Old myocardial infarction: Secondary | ICD-10-CM | POA: Diagnosis not present

## 2021-07-18 DIAGNOSIS — Z951 Presence of aortocoronary bypass graft: Secondary | ICD-10-CM | POA: Diagnosis not present

## 2021-07-18 DIAGNOSIS — Z7901 Long term (current) use of anticoagulants: Secondary | ICD-10-CM | POA: Diagnosis not present

## 2021-07-18 DIAGNOSIS — Z9981 Dependence on supplemental oxygen: Secondary | ICD-10-CM | POA: Diagnosis not present

## 2021-07-18 DIAGNOSIS — J432 Centrilobular emphysema: Secondary | ICD-10-CM | POA: Diagnosis not present

## 2021-07-18 DIAGNOSIS — J189 Pneumonia, unspecified organism: Secondary | ICD-10-CM | POA: Diagnosis not present

## 2021-07-18 DIAGNOSIS — N183 Chronic kidney disease, stage 3 unspecified: Secondary | ICD-10-CM | POA: Diagnosis not present

## 2021-07-18 DIAGNOSIS — Z7951 Long term (current) use of inhaled steroids: Secondary | ICD-10-CM | POA: Diagnosis not present

## 2021-07-18 DIAGNOSIS — N179 Acute kidney failure, unspecified: Secondary | ICD-10-CM | POA: Diagnosis not present

## 2021-07-18 DIAGNOSIS — I130 Hypertensive heart and chronic kidney disease with heart failure and stage 1 through stage 4 chronic kidney disease, or unspecified chronic kidney disease: Secondary | ICD-10-CM | POA: Diagnosis not present

## 2021-07-18 DIAGNOSIS — K219 Gastro-esophageal reflux disease without esophagitis: Secondary | ICD-10-CM | POA: Diagnosis not present

## 2021-07-18 DIAGNOSIS — I2510 Atherosclerotic heart disease of native coronary artery without angina pectoris: Secondary | ICD-10-CM | POA: Diagnosis not present

## 2021-07-19 DIAGNOSIS — I509 Heart failure, unspecified: Secondary | ICD-10-CM | POA: Diagnosis not present

## 2021-07-19 DIAGNOSIS — N183 Chronic kidney disease, stage 3 unspecified: Secondary | ICD-10-CM | POA: Diagnosis not present

## 2021-07-19 DIAGNOSIS — I2510 Atherosclerotic heart disease of native coronary artery without angina pectoris: Secondary | ICD-10-CM | POA: Diagnosis not present

## 2021-07-19 DIAGNOSIS — I130 Hypertensive heart and chronic kidney disease with heart failure and stage 1 through stage 4 chronic kidney disease, or unspecified chronic kidney disease: Secondary | ICD-10-CM | POA: Diagnosis not present

## 2021-07-19 DIAGNOSIS — I4811 Longstanding persistent atrial fibrillation: Secondary | ICD-10-CM | POA: Diagnosis not present

## 2021-07-19 DIAGNOSIS — E1122 Type 2 diabetes mellitus with diabetic chronic kidney disease: Secondary | ICD-10-CM | POA: Diagnosis not present

## 2021-07-22 DIAGNOSIS — E1122 Type 2 diabetes mellitus with diabetic chronic kidney disease: Secondary | ICD-10-CM | POA: Diagnosis not present

## 2021-07-22 DIAGNOSIS — I4811 Longstanding persistent atrial fibrillation: Secondary | ICD-10-CM | POA: Diagnosis not present

## 2021-07-22 DIAGNOSIS — N183 Chronic kidney disease, stage 3 unspecified: Secondary | ICD-10-CM | POA: Diagnosis not present

## 2021-07-22 DIAGNOSIS — I509 Heart failure, unspecified: Secondary | ICD-10-CM | POA: Diagnosis not present

## 2021-07-22 DIAGNOSIS — I2510 Atherosclerotic heart disease of native coronary artery without angina pectoris: Secondary | ICD-10-CM | POA: Diagnosis not present

## 2021-07-22 DIAGNOSIS — I130 Hypertensive heart and chronic kidney disease with heart failure and stage 1 through stage 4 chronic kidney disease, or unspecified chronic kidney disease: Secondary | ICD-10-CM | POA: Diagnosis not present

## 2021-07-23 ENCOUNTER — Telehealth: Payer: Self-pay | Admitting: Cardiology

## 2021-07-23 DIAGNOSIS — I130 Hypertensive heart and chronic kidney disease with heart failure and stage 1 through stage 4 chronic kidney disease, or unspecified chronic kidney disease: Secondary | ICD-10-CM | POA: Diagnosis not present

## 2021-07-23 DIAGNOSIS — I4811 Longstanding persistent atrial fibrillation: Secondary | ICD-10-CM | POA: Diagnosis not present

## 2021-07-23 DIAGNOSIS — I509 Heart failure, unspecified: Secondary | ICD-10-CM | POA: Diagnosis not present

## 2021-07-23 DIAGNOSIS — E1122 Type 2 diabetes mellitus with diabetic chronic kidney disease: Secondary | ICD-10-CM | POA: Diagnosis not present

## 2021-07-23 DIAGNOSIS — I2510 Atherosclerotic heart disease of native coronary artery without angina pectoris: Secondary | ICD-10-CM | POA: Diagnosis not present

## 2021-07-23 DIAGNOSIS — N183 Chronic kidney disease, stage 3 unspecified: Secondary | ICD-10-CM | POA: Diagnosis not present

## 2021-07-23 NOTE — Telephone Encounter (Signed)
   Pt c/o BP issue: STAT if pt c/o blurred vision, one-sided weakness or slurred speech  1. What are your last 5 BP readings? 85/45  2. Are you having any other symptoms (ex. Dizziness, headache, blurred vision, passed out)? feeling tired all the time  3. What is your BP issue? Pt's son said yesterday during a home health visit pt's BP was low 85/45, they held his lasix and entresto. They wanted to know when the pt can restart lasix and entresto

## 2021-07-24 DIAGNOSIS — I517 Cardiomegaly: Secondary | ICD-10-CM | POA: Diagnosis not present

## 2021-07-24 DIAGNOSIS — R531 Weakness: Secondary | ICD-10-CM | POA: Diagnosis not present

## 2021-07-24 DIAGNOSIS — I5033 Acute on chronic diastolic (congestive) heart failure: Secondary | ICD-10-CM | POA: Diagnosis not present

## 2021-07-24 DIAGNOSIS — A4102 Sepsis due to Methicillin resistant Staphylococcus aureus: Secondary | ICD-10-CM | POA: Diagnosis not present

## 2021-07-24 DIAGNOSIS — J158 Pneumonia due to other specified bacteria: Secondary | ICD-10-CM | POA: Diagnosis not present

## 2021-07-24 DIAGNOSIS — J439 Emphysema, unspecified: Secondary | ICD-10-CM | POA: Diagnosis not present

## 2021-07-24 DIAGNOSIS — N179 Acute kidney failure, unspecified: Secondary | ICD-10-CM | POA: Diagnosis not present

## 2021-07-24 DIAGNOSIS — R42 Dizziness and giddiness: Secondary | ICD-10-CM | POA: Diagnosis not present

## 2021-07-24 DIAGNOSIS — N281 Cyst of kidney, acquired: Secondary | ICD-10-CM | POA: Diagnosis not present

## 2021-07-24 DIAGNOSIS — R310 Gross hematuria: Secondary | ICD-10-CM | POA: Diagnosis not present

## 2021-07-24 DIAGNOSIS — N200 Calculus of kidney: Secondary | ICD-10-CM | POA: Diagnosis not present

## 2021-07-24 DIAGNOSIS — R319 Hematuria, unspecified: Secondary | ICD-10-CM | POA: Diagnosis not present

## 2021-07-25 ENCOUNTER — Telehealth: Payer: Self-pay

## 2021-07-25 DIAGNOSIS — R918 Other nonspecific abnormal finding of lung field: Secondary | ICD-10-CM | POA: Diagnosis not present

## 2021-07-25 DIAGNOSIS — N390 Urinary tract infection, site not specified: Secondary | ICD-10-CM | POA: Diagnosis present

## 2021-07-25 DIAGNOSIS — Z7901 Long term (current) use of anticoagulants: Secondary | ICD-10-CM | POA: Diagnosis not present

## 2021-07-25 DIAGNOSIS — I429 Cardiomyopathy, unspecified: Secondary | ICD-10-CM | POA: Diagnosis present

## 2021-07-25 DIAGNOSIS — N179 Acute kidney failure, unspecified: Secondary | ICD-10-CM | POA: Diagnosis not present

## 2021-07-25 DIAGNOSIS — J989 Respiratory disorder, unspecified: Secondary | ICD-10-CM | POA: Diagnosis not present

## 2021-07-25 DIAGNOSIS — R0902 Hypoxemia: Secondary | ICD-10-CM | POA: Diagnosis not present

## 2021-07-25 DIAGNOSIS — Z951 Presence of aortocoronary bypass graft: Secondary | ICD-10-CM | POA: Diagnosis not present

## 2021-07-25 DIAGNOSIS — Z9911 Dependence on respirator [ventilator] status: Secondary | ICD-10-CM | POA: Diagnosis not present

## 2021-07-25 DIAGNOSIS — N281 Cyst of kidney, acquired: Secondary | ICD-10-CM | POA: Diagnosis not present

## 2021-07-25 DIAGNOSIS — Z66 Do not resuscitate: Secondary | ICD-10-CM | POA: Diagnosis not present

## 2021-07-25 DIAGNOSIS — T462X5A Adverse effect of other antidysrhythmic drugs, initial encounter: Secondary | ICD-10-CM | POA: Diagnosis present

## 2021-07-25 DIAGNOSIS — N200 Calculus of kidney: Secondary | ICD-10-CM | POA: Diagnosis not present

## 2021-07-25 DIAGNOSIS — R310 Gross hematuria: Secondary | ICD-10-CM | POA: Diagnosis not present

## 2021-07-25 DIAGNOSIS — J690 Pneumonitis due to inhalation of food and vomit: Secondary | ICD-10-CM | POA: Diagnosis present

## 2021-07-25 DIAGNOSIS — A4102 Sepsis due to Methicillin resistant Staphylococcus aureus: Secondary | ICD-10-CM | POA: Diagnosis present

## 2021-07-25 DIAGNOSIS — I361 Nonrheumatic tricuspid (valve) insufficiency: Secondary | ICD-10-CM | POA: Diagnosis not present

## 2021-07-25 DIAGNOSIS — I509 Heart failure, unspecified: Secondary | ICD-10-CM | POA: Diagnosis not present

## 2021-07-25 DIAGNOSIS — J9600 Acute respiratory failure, unspecified whether with hypoxia or hypercapnia: Secondary | ICD-10-CM | POA: Diagnosis not present

## 2021-07-25 DIAGNOSIS — I4892 Unspecified atrial flutter: Secondary | ICD-10-CM | POA: Diagnosis present

## 2021-07-25 DIAGNOSIS — R4182 Altered mental status, unspecified: Secondary | ICD-10-CM | POA: Diagnosis not present

## 2021-07-25 DIAGNOSIS — R319 Hematuria, unspecified: Secondary | ICD-10-CM | POA: Diagnosis present

## 2021-07-25 DIAGNOSIS — R7881 Bacteremia: Secondary | ICD-10-CM | POA: Diagnosis not present

## 2021-07-25 DIAGNOSIS — I130 Hypertensive heart and chronic kidney disease with heart failure and stage 1 through stage 4 chronic kidney disease, or unspecified chronic kidney disease: Secondary | ICD-10-CM | POA: Diagnosis present

## 2021-07-25 DIAGNOSIS — R42 Dizziness and giddiness: Secondary | ICD-10-CM | POA: Diagnosis not present

## 2021-07-25 DIAGNOSIS — I4819 Other persistent atrial fibrillation: Secondary | ICD-10-CM | POA: Diagnosis present

## 2021-07-25 DIAGNOSIS — I517 Cardiomegaly: Secondary | ICD-10-CM | POA: Diagnosis not present

## 2021-07-25 DIAGNOSIS — K718 Toxic liver disease with other disorders of liver: Secondary | ICD-10-CM | POA: Diagnosis present

## 2021-07-25 DIAGNOSIS — R531 Weakness: Secondary | ICD-10-CM | POA: Diagnosis not present

## 2021-07-25 DIAGNOSIS — J90 Pleural effusion, not elsewhere classified: Secondary | ICD-10-CM | POA: Diagnosis not present

## 2021-07-25 DIAGNOSIS — R6521 Severe sepsis with septic shock: Secondary | ICD-10-CM | POA: Diagnosis present

## 2021-07-25 DIAGNOSIS — J869 Pyothorax without fistula: Secondary | ICD-10-CM | POA: Diagnosis not present

## 2021-07-25 DIAGNOSIS — M79604 Pain in right leg: Secondary | ICD-10-CM | POA: Diagnosis not present

## 2021-07-25 DIAGNOSIS — I472 Ventricular tachycardia: Secondary | ICD-10-CM | POA: Diagnosis not present

## 2021-07-25 DIAGNOSIS — R0602 Shortness of breath: Secondary | ICD-10-CM | POA: Diagnosis not present

## 2021-07-25 DIAGNOSIS — Z79899 Other long term (current) drug therapy: Secondary | ICD-10-CM | POA: Diagnosis not present

## 2021-07-25 DIAGNOSIS — J440 Chronic obstructive pulmonary disease with acute lower respiratory infection: Secondary | ICD-10-CM | POA: Diagnosis present

## 2021-07-25 DIAGNOSIS — E119 Type 2 diabetes mellitus without complications: Secondary | ICD-10-CM | POA: Diagnosis not present

## 2021-07-25 DIAGNOSIS — Z48813 Encounter for surgical aftercare following surgery on the respiratory system: Secondary | ICD-10-CM | POA: Diagnosis not present

## 2021-07-25 DIAGNOSIS — J439 Emphysema, unspecified: Secondary | ICD-10-CM | POA: Diagnosis not present

## 2021-07-25 DIAGNOSIS — J9690 Respiratory failure, unspecified, unspecified whether with hypoxia or hypercapnia: Secondary | ICD-10-CM | POA: Diagnosis not present

## 2021-07-25 DIAGNOSIS — I471 Supraventricular tachycardia: Secondary | ICD-10-CM | POA: Diagnosis present

## 2021-07-25 DIAGNOSIS — I959 Hypotension, unspecified: Secondary | ICD-10-CM | POA: Diagnosis not present

## 2021-07-25 DIAGNOSIS — I5033 Acute on chronic diastolic (congestive) heart failure: Secondary | ICD-10-CM | POA: Diagnosis present

## 2021-07-25 DIAGNOSIS — J9621 Acute and chronic respiratory failure with hypoxia: Secondary | ICD-10-CM | POA: Diagnosis present

## 2021-07-25 DIAGNOSIS — J158 Pneumonia due to other specified bacteria: Secondary | ICD-10-CM | POA: Diagnosis present

## 2021-07-25 DIAGNOSIS — N170 Acute kidney failure with tubular necrosis: Secondary | ICD-10-CM | POA: Diagnosis not present

## 2021-07-25 DIAGNOSIS — D696 Thrombocytopenia, unspecified: Secondary | ICD-10-CM | POA: Diagnosis present

## 2021-07-25 DIAGNOSIS — Z452 Encounter for adjustment and management of vascular access device: Secondary | ICD-10-CM | POA: Diagnosis not present

## 2021-07-25 DIAGNOSIS — I2510 Atherosclerotic heart disease of native coronary artery without angina pectoris: Secondary | ICD-10-CM | POA: Diagnosis present

## 2021-07-25 DIAGNOSIS — J849 Interstitial pulmonary disease, unspecified: Secondary | ICD-10-CM | POA: Diagnosis not present

## 2021-07-25 DIAGNOSIS — M7989 Other specified soft tissue disorders: Secondary | ICD-10-CM | POA: Diagnosis not present

## 2021-07-25 DIAGNOSIS — R1310 Dysphagia, unspecified: Secondary | ICD-10-CM | POA: Diagnosis present

## 2021-07-25 NOTE — Telephone Encounter (Signed)
Prior Auth for Brad [name] approved for 1 year starting on 06/25/2021 per [employer] rep. Case #[id number]. Patient notified via VM(ok per DPR)

## 2021-07-26 ENCOUNTER — Encounter: Payer: Self-pay | Admitting: Cardiology

## 2021-07-26 DIAGNOSIS — I4819 Other persistent atrial fibrillation: Secondary | ICD-10-CM | POA: Diagnosis not present

## 2021-07-26 DIAGNOSIS — I429 Cardiomyopathy, unspecified: Secondary | ICD-10-CM | POA: Diagnosis not present

## 2021-07-26 DIAGNOSIS — I472 Ventricular tachycardia: Secondary | ICD-10-CM | POA: Diagnosis not present

## 2021-07-27 DIAGNOSIS — I361 Nonrheumatic tricuspid (valve) insufficiency: Secondary | ICD-10-CM | POA: Diagnosis not present

## 2021-07-27 DIAGNOSIS — I471 Supraventricular tachycardia: Secondary | ICD-10-CM | POA: Diagnosis not present

## 2021-07-27 DIAGNOSIS — I472 Ventricular tachycardia: Secondary | ICD-10-CM

## 2021-07-27 DIAGNOSIS — I2510 Atherosclerotic heart disease of native coronary artery without angina pectoris: Secondary | ICD-10-CM

## 2021-07-27 DIAGNOSIS — Z7901 Long term (current) use of anticoagulants: Secondary | ICD-10-CM | POA: Diagnosis not present

## 2021-07-27 DIAGNOSIS — I5033 Acute on chronic diastolic (congestive) heart failure: Secondary | ICD-10-CM

## 2021-07-27 DIAGNOSIS — Z79899 Other long term (current) drug therapy: Secondary | ICD-10-CM | POA: Diagnosis not present

## 2021-07-27 DIAGNOSIS — I429 Cardiomyopathy, unspecified: Secondary | ICD-10-CM | POA: Diagnosis not present

## 2021-07-28 DIAGNOSIS — I471 Supraventricular tachycardia: Secondary | ICD-10-CM | POA: Diagnosis not present

## 2021-07-28 DIAGNOSIS — Z7901 Long term (current) use of anticoagulants: Secondary | ICD-10-CM | POA: Diagnosis not present

## 2021-07-28 DIAGNOSIS — Z79899 Other long term (current) drug therapy: Secondary | ICD-10-CM | POA: Diagnosis not present

## 2021-07-28 DIAGNOSIS — R7881 Bacteremia: Secondary | ICD-10-CM

## 2021-07-28 DIAGNOSIS — I429 Cardiomyopathy, unspecified: Secondary | ICD-10-CM | POA: Diagnosis not present

## 2021-07-29 DIAGNOSIS — Z79899 Other long term (current) drug therapy: Secondary | ICD-10-CM

## 2021-07-29 DIAGNOSIS — I472 Ventricular tachycardia: Secondary | ICD-10-CM | POA: Diagnosis not present

## 2021-07-29 DIAGNOSIS — I2510 Atherosclerotic heart disease of native coronary artery without angina pectoris: Secondary | ICD-10-CM | POA: Diagnosis not present

## 2021-07-29 DIAGNOSIS — I4819 Other persistent atrial fibrillation: Secondary | ICD-10-CM | POA: Diagnosis not present

## 2021-07-29 DIAGNOSIS — R7881 Bacteremia: Secondary | ICD-10-CM | POA: Diagnosis not present

## 2021-07-30 ENCOUNTER — Ambulatory Visit: Payer: Medicare Other | Admitting: Cardiology

## 2021-07-30 DIAGNOSIS — R7881 Bacteremia: Secondary | ICD-10-CM | POA: Diagnosis not present

## 2021-07-30 DIAGNOSIS — I4819 Other persistent atrial fibrillation: Secondary | ICD-10-CM | POA: Diagnosis not present

## 2021-07-30 DIAGNOSIS — I472 Ventricular tachycardia: Secondary | ICD-10-CM | POA: Diagnosis not present

## 2021-07-30 DIAGNOSIS — I2510 Atherosclerotic heart disease of native coronary artery without angina pectoris: Secondary | ICD-10-CM | POA: Diagnosis not present

## 2021-07-31 DIAGNOSIS — I4819 Other persistent atrial fibrillation: Secondary | ICD-10-CM

## 2021-07-31 DIAGNOSIS — Z79899 Other long term (current) drug therapy: Secondary | ICD-10-CM

## 2021-07-31 DIAGNOSIS — R7881 Bacteremia: Secondary | ICD-10-CM

## 2021-07-31 DIAGNOSIS — Z7901 Long term (current) use of anticoagulants: Secondary | ICD-10-CM

## 2021-07-31 DIAGNOSIS — E119 Type 2 diabetes mellitus without complications: Secondary | ICD-10-CM

## 2021-08-01 DIAGNOSIS — I4819 Other persistent atrial fibrillation: Secondary | ICD-10-CM | POA: Diagnosis not present

## 2021-08-01 DIAGNOSIS — Z79899 Other long term (current) drug therapy: Secondary | ICD-10-CM | POA: Diagnosis not present

## 2021-08-01 DIAGNOSIS — R7881 Bacteremia: Secondary | ICD-10-CM | POA: Diagnosis not present

## 2021-08-01 DIAGNOSIS — Z7901 Long term (current) use of anticoagulants: Secondary | ICD-10-CM | POA: Diagnosis not present

## 2021-08-02 DIAGNOSIS — I4819 Other persistent atrial fibrillation: Secondary | ICD-10-CM | POA: Diagnosis not present

## 2021-08-02 DIAGNOSIS — Z452 Encounter for adjustment and management of vascular access device: Secondary | ICD-10-CM | POA: Diagnosis not present

## 2021-08-02 DIAGNOSIS — R7881 Bacteremia: Secondary | ICD-10-CM | POA: Diagnosis not present

## 2021-08-02 DIAGNOSIS — Z79899 Other long term (current) drug therapy: Secondary | ICD-10-CM | POA: Diagnosis not present

## 2021-08-02 DIAGNOSIS — Z7901 Long term (current) use of anticoagulants: Secondary | ICD-10-CM | POA: Diagnosis not present

## 2021-08-03 DIAGNOSIS — I2510 Atherosclerotic heart disease of native coronary artery without angina pectoris: Secondary | ICD-10-CM

## 2021-08-03 DIAGNOSIS — I472 Ventricular tachycardia: Secondary | ICD-10-CM

## 2021-08-03 DIAGNOSIS — Z79899 Other long term (current) drug therapy: Secondary | ICD-10-CM

## 2021-08-03 DIAGNOSIS — R7881 Bacteremia: Secondary | ICD-10-CM

## 2021-08-03 DIAGNOSIS — I4819 Other persistent atrial fibrillation: Secondary | ICD-10-CM

## 2021-08-04 DIAGNOSIS — I2510 Atherosclerotic heart disease of native coronary artery without angina pectoris: Secondary | ICD-10-CM | POA: Diagnosis not present

## 2021-08-04 DIAGNOSIS — R7881 Bacteremia: Secondary | ICD-10-CM | POA: Diagnosis not present

## 2021-08-04 DIAGNOSIS — I4819 Other persistent atrial fibrillation: Secondary | ICD-10-CM | POA: Diagnosis not present

## 2021-08-04 DIAGNOSIS — I472 Ventricular tachycardia: Secondary | ICD-10-CM | POA: Diagnosis not present

## 2021-08-05 DIAGNOSIS — I4819 Other persistent atrial fibrillation: Secondary | ICD-10-CM

## 2021-08-05 DIAGNOSIS — I429 Cardiomyopathy, unspecified: Secondary | ICD-10-CM

## 2021-08-05 DIAGNOSIS — I509 Heart failure, unspecified: Secondary | ICD-10-CM

## 2021-08-05 DIAGNOSIS — I472 Ventricular tachycardia: Secondary | ICD-10-CM

## 2021-08-05 DIAGNOSIS — Z7901 Long term (current) use of anticoagulants: Secondary | ICD-10-CM

## 2021-08-05 DIAGNOSIS — R7881 Bacteremia: Secondary | ICD-10-CM

## 2021-08-05 DIAGNOSIS — Z79899 Other long term (current) drug therapy: Secondary | ICD-10-CM

## 2021-08-05 DIAGNOSIS — I2510 Atherosclerotic heart disease of native coronary artery without angina pectoris: Secondary | ICD-10-CM

## 2021-08-06 DIAGNOSIS — I429 Cardiomyopathy, unspecified: Secondary | ICD-10-CM | POA: Diagnosis not present

## 2021-08-06 DIAGNOSIS — Z7901 Long term (current) use of anticoagulants: Secondary | ICD-10-CM | POA: Diagnosis not present

## 2021-08-06 DIAGNOSIS — I4819 Other persistent atrial fibrillation: Secondary | ICD-10-CM | POA: Diagnosis not present

## 2021-08-06 DIAGNOSIS — I472 Ventricular tachycardia: Secondary | ICD-10-CM | POA: Diagnosis not present

## 2021-08-07 DIAGNOSIS — Z7901 Long term (current) use of anticoagulants: Secondary | ICD-10-CM | POA: Diagnosis not present

## 2021-08-07 DIAGNOSIS — I472 Ventricular tachycardia: Secondary | ICD-10-CM | POA: Diagnosis not present

## 2021-08-07 DIAGNOSIS — I4819 Other persistent atrial fibrillation: Secondary | ICD-10-CM | POA: Diagnosis not present

## 2021-08-07 DIAGNOSIS — I429 Cardiomyopathy, unspecified: Secondary | ICD-10-CM | POA: Diagnosis not present

## 2021-08-08 DIAGNOSIS — I429 Cardiomyopathy, unspecified: Secondary | ICD-10-CM | POA: Diagnosis not present

## 2021-08-08 DIAGNOSIS — I4819 Other persistent atrial fibrillation: Secondary | ICD-10-CM | POA: Diagnosis not present

## 2021-08-08 DIAGNOSIS — I472 Ventricular tachycardia: Secondary | ICD-10-CM | POA: Diagnosis not present

## 2021-08-08 DIAGNOSIS — I509 Heart failure, unspecified: Secondary | ICD-10-CM | POA: Diagnosis not present

## 2021-08-09 DIAGNOSIS — I4819 Other persistent atrial fibrillation: Secondary | ICD-10-CM | POA: Diagnosis not present

## 2021-08-09 DIAGNOSIS — I429 Cardiomyopathy, unspecified: Secondary | ICD-10-CM | POA: Diagnosis not present

## 2021-08-09 DIAGNOSIS — I2510 Atherosclerotic heart disease of native coronary artery without angina pectoris: Secondary | ICD-10-CM | POA: Diagnosis not present

## 2021-08-09 DIAGNOSIS — I472 Ventricular tachycardia: Secondary | ICD-10-CM | POA: Diagnosis not present

## 2021-08-10 DIAGNOSIS — I4819 Other persistent atrial fibrillation: Secondary | ICD-10-CM

## 2021-08-10 DIAGNOSIS — I472 Ventricular tachycardia: Secondary | ICD-10-CM

## 2021-08-10 DIAGNOSIS — I429 Cardiomyopathy, unspecified: Secondary | ICD-10-CM

## 2021-08-31 DEATH — deceased
